# Patient Record
Sex: Male | Born: 1935
Health system: Southern US, Community
[De-identification: ages and names within clinical notes are randomized; demographics above are authoritative.]

## PROBLEM LIST (undated history)

## (undated) DIAGNOSIS — R972 Elevated prostate specific antigen [PSA]: Secondary | ICD-10-CM

## (undated) DIAGNOSIS — D638 Anemia in other chronic diseases classified elsewhere: Secondary | ICD-10-CM

## (undated) DIAGNOSIS — I160 Hypertensive urgency: Secondary | ICD-10-CM

## (undated) DIAGNOSIS — N400 Enlarged prostate without lower urinary tract symptoms: Secondary | ICD-10-CM

## (undated) DIAGNOSIS — J690 Pneumonitis due to inhalation of food and vomit: Secondary | ICD-10-CM

## (undated) DIAGNOSIS — I639 Cerebral infarction, unspecified: Secondary | ICD-10-CM

## (undated) DIAGNOSIS — G309 Alzheimer's disease, unspecified: Secondary | ICD-10-CM

## (undated) DIAGNOSIS — J101 Influenza due to other identified influenza virus with other respiratory manifestations: Secondary | ICD-10-CM

## (undated) DIAGNOSIS — S42302A Unspecified fracture of shaft of humerus, left arm, initial encounter for closed fracture: Secondary | ICD-10-CM

## (undated) DIAGNOSIS — G459 Transient cerebral ischemic attack, unspecified: Secondary | ICD-10-CM

## (undated) DIAGNOSIS — D509 Iron deficiency anemia, unspecified: Secondary | ICD-10-CM

## (undated) DIAGNOSIS — F0280 Dementia in other diseases classified elsewhere without behavioral disturbance: Secondary | ICD-10-CM

## (undated) DIAGNOSIS — N390 Urinary tract infection, site not specified: Secondary | ICD-10-CM

## (undated) DIAGNOSIS — D473 Essential (hemorrhagic) thrombocythemia: Secondary | ICD-10-CM

## (undated) DIAGNOSIS — M109 Gout, unspecified: Secondary | ICD-10-CM

## (undated) DIAGNOSIS — R5381 Other malaise: Secondary | ICD-10-CM

## (undated) DIAGNOSIS — J189 Pneumonia, unspecified organism: Secondary | ICD-10-CM

## (undated) DIAGNOSIS — A419 Sepsis, unspecified organism: Secondary | ICD-10-CM

## (undated) DIAGNOSIS — R1310 Dysphagia, unspecified: Secondary | ICD-10-CM

## (undated) DIAGNOSIS — D75839 Thrombocytosis, unspecified: Secondary | ICD-10-CM

## (undated) DIAGNOSIS — D72829 Elevated white blood cell count, unspecified: Secondary | ICD-10-CM

## (undated) DIAGNOSIS — N159 Renal tubulo-interstitial disease, unspecified: Secondary | ICD-10-CM

## (undated) DIAGNOSIS — N179 Acute kidney failure, unspecified: Secondary | ICD-10-CM

## (undated) DIAGNOSIS — G9341 Metabolic encephalopathy: Secondary | ICD-10-CM

## (undated) HISTORY — DX: Dysphagia, unspecified: R13.10

## (undated) HISTORY — DX: Pneumonitis due to inhalation of food and vomit: J69.0

## (undated) HISTORY — DX: Hypertensive urgency: I16.0

## (undated) HISTORY — DX: Thrombocytosis, unspecified: D75.839

## (undated) HISTORY — DX: Urinary tract infection, site not specified: N39.0

## (undated) HISTORY — DX: Unspecified fracture of shaft of humerus, left arm, initial encounter for closed fracture: S42.302A

## (undated) HISTORY — DX: Iron deficiency anemia, unspecified: D50.9

## (undated) HISTORY — DX: Metabolic encephalopathy: G93.41

## (undated) HISTORY — DX: Anemia in other chronic diseases classified elsewhere: D63.8

## (undated) HISTORY — DX: Sepsis, unspecified organism: A41.9

## (undated) HISTORY — DX: Elevated prostate specific antigen (PSA): R97.20

## (undated) HISTORY — DX: Influenza due to other identified influenza virus with other respiratory manifestations: J10.1

## (undated) HISTORY — DX: Transient cerebral ischemic attack, unspecified: G45.9

## (undated) HISTORY — DX: Acute kidney failure, unspecified: N17.9

## (undated) HISTORY — DX: Benign prostatic hyperplasia without lower urinary tract symptoms: N40.0

## (undated) HISTORY — DX: Other malaise: R53.81

## (undated) HISTORY — DX: Pneumonia, unspecified organism: J18.9

## (undated) HISTORY — DX: Essential (hemorrhagic) thrombocythemia: D47.3

## (undated) HISTORY — DX: Elevated white blood cell count, unspecified: D72.829

---

## 2009-03-25 ENCOUNTER — Emergency Department (HOSPITAL_COMMUNITY): Admission: EM | Admit: 2009-03-25 | Discharge: 2009-03-25 | Payer: Self-pay | Admitting: Emergency Medicine

## 2010-05-29 HISTORY — PX: CATARACT EXTRACTION: SUR2

## 2010-09-01 LAB — URINE CULTURE: Colony Count: NO GROWTH

## 2010-09-01 LAB — POCT URINALYSIS DIP (DEVICE)
Bilirubin Urine: NEGATIVE
Glucose, UA: NEGATIVE mg/dL
Nitrite: NEGATIVE
Urobilinogen, UA: 1 mg/dL (ref 0.0–1.0)

## 2011-01-20 ENCOUNTER — Emergency Department (HOSPITAL_COMMUNITY)
Admission: EM | Admit: 2011-01-20 | Discharge: 2011-01-20 | Disposition: A | Discharge status: Home / Self Care | Payer: Medicare Other | Attending: Emergency Medicine | Admitting: Emergency Medicine

## 2011-01-20 DIAGNOSIS — I10 Essential (primary) hypertension: Secondary | ICD-10-CM | POA: Insufficient documentation

## 2011-01-20 DIAGNOSIS — N368 Other specified disorders of urethra: Secondary | ICD-10-CM | POA: Insufficient documentation

## 2011-01-20 DIAGNOSIS — N501 Vascular disorders of male genital organs: Secondary | ICD-10-CM | POA: Insufficient documentation

## 2011-01-20 LAB — DIFFERENTIAL
Basophils Relative: 1 % (ref 0–1)
Eosinophils Absolute: 0.1 10*3/uL (ref 0.0–0.7)
Lymphs Abs: 3 10*3/uL (ref 0.7–4.0)
Monocytes Absolute: 0.8 10*3/uL (ref 0.1–1.0)
Monocytes Relative: 8 % (ref 3–12)
Neutro Abs: 6.4 10*3/uL (ref 1.7–7.7)
Neutrophils Relative %: 62 % (ref 43–77)

## 2011-01-20 LAB — URINALYSIS, ROUTINE W REFLEX MICROSCOPIC
Glucose, UA: NEGATIVE mg/dL
Hgb urine dipstick: NEGATIVE
Ketones, ur: NEGATIVE mg/dL
Protein, ur: NEGATIVE mg/dL
pH: 5.5 (ref 5.0–8.0)

## 2011-01-20 LAB — POCT I-STAT, CHEM 8
BUN: 20 mg/dL (ref 6–23)
Calcium, Ion: 1.21 mmol/L (ref 1.12–1.32)
Chloride: 104 mEq/L (ref 96–112)
Creatinine, Ser: 1.2 mg/dL (ref 0.50–1.35)
TCO2: 31 mmol/L (ref 0–100)

## 2011-01-20 LAB — CBC
Hemoglobin: 12.7 g/dL — ABNORMAL LOW (ref 13.0–17.0)
MCH: 23 pg — ABNORMAL LOW (ref 26.0–34.0)
MCHC: 32.2 g/dL (ref 30.0–36.0)
MCV: 71.4 fL — ABNORMAL LOW (ref 78.0–100.0)
RBC: 5.52 MIL/uL (ref 4.22–5.81)

## 2011-07-24 DIAGNOSIS — H25019 Cortical age-related cataract, unspecified eye: Secondary | ICD-10-CM | POA: Diagnosis not present

## 2011-07-24 DIAGNOSIS — H2510 Age-related nuclear cataract, unspecified eye: Secondary | ICD-10-CM | POA: Diagnosis not present

## 2012-01-28 HISTORY — PX: MOUTH SURGERY: SHX715

## 2012-02-06 DIAGNOSIS — M278 Other specified diseases of jaws: Secondary | ICD-10-CM | POA: Diagnosis not present

## 2012-02-06 DIAGNOSIS — F0390 Unspecified dementia without behavioral disturbance: Secondary | ICD-10-CM | POA: Diagnosis not present

## 2012-02-06 DIAGNOSIS — K069 Disorder of gingiva and edentulous alveolar ridge, unspecified: Secondary | ICD-10-CM | POA: Diagnosis not present

## 2012-02-06 DIAGNOSIS — K056 Periodontal disease, unspecified: Secondary | ICD-10-CM | POA: Diagnosis not present

## 2012-02-06 DIAGNOSIS — K029 Dental caries, unspecified: Secondary | ICD-10-CM | POA: Diagnosis not present

## 2012-02-06 DIAGNOSIS — K0520 Aggressive periodontitis, unspecified: Secondary | ICD-10-CM | POA: Diagnosis not present

## 2012-08-24 ENCOUNTER — Emergency Department (INDEPENDENT_AMBULATORY_CARE_PROVIDER_SITE_OTHER): Payer: Medicare Other

## 2012-08-24 ENCOUNTER — Emergency Department (INDEPENDENT_AMBULATORY_CARE_PROVIDER_SITE_OTHER)
Admission: EM | Admit: 2012-08-24 | Discharge: 2012-08-24 | Disposition: A | Discharge status: Home / Self Care | Payer: Medicare Other | Source: Home / Self Care | Attending: Family Medicine | Admitting: Family Medicine

## 2012-08-24 ENCOUNTER — Encounter (HOSPITAL_COMMUNITY): Payer: Self-pay | Admitting: Emergency Medicine

## 2012-08-24 DIAGNOSIS — J069 Acute upper respiratory infection, unspecified: Secondary | ICD-10-CM

## 2012-08-24 DIAGNOSIS — R5383 Other fatigue: Secondary | ICD-10-CM | POA: Diagnosis not present

## 2012-08-24 DIAGNOSIS — R509 Fever, unspecified: Secondary | ICD-10-CM | POA: Diagnosis not present

## 2012-08-24 DIAGNOSIS — R05 Cough: Secondary | ICD-10-CM | POA: Diagnosis not present

## 2012-08-24 MED ORDER — HYDROCOD POLST-CHLORPHEN POLST 10-8 MG/5ML PO LQCR: 5.0000 mL | Freq: Two times a day (BID) | ORAL | Status: DC | PRN

## 2012-08-24 NOTE — ED Notes (Signed)
Waiting discharge papers 

## 2012-08-24 NOTE — ED Notes (Signed)
Reports: dry nonproductive cough for 10 days. Pt states started feeling really bad/ fatigue on Sunday. Low grade temp yesterday only. Pt denies sob and chest pain, n/v/d  Pt has been using vicks rub and onion lemon tea with mild relief.

## 2012-08-24 NOTE — ED Provider Notes (Signed)
History     CSN: [id number]  Arrival date & time 08/24/12  1117   First MD Initiated Contact with Patient 08/24/12 1121      Chief Complaint  Patient presents with  . URI    chest congestion. dry nonproductive cough. low grad temp yesterday. fatigue    (Consider location/radiation/quality/duration/timing/severity/associated sxs/prior treatment) Patient is a 79 y.o. male presenting with URI. The history is provided by a relative.  URI Presenting symptoms: congestion, cough, fatigue, fever and rhinorrhea   Presenting symptoms: no ear pain, no facial pain and no sore throat   Congestion:    Location:  Chest Cough:    Cough characteristics:  Productive   Sputum characteristics:  Yellow   Severity:  Moderate   Onset quality:  Gradual   Duration:  2 weeks   Timing:  Intermittent   Progression:  Unchanged   Chronicity:  New Associated symptoms: sneezing   Associated symptoms: no neck pain, no sinus pain, no swollen glands and no wheezing   Risk factors: being elderly   Risk factors: no chronic cardiac disease, no chronic kidney disease, no chronic respiratory disease and no diabetes mellitus   Daughter reports pt had onset of cough approx 2 weeks aog. Monday pt began to "feel bad" and was sleeping more. She has noticed occasional rhinorrhea and sneezing. Yesterday pt had a  (subjective) low grade fever.  Family has been encouraging fluids and hot tea for sx's which has seemed to help but when pt seemed to have fever yesterday with persistent cough she was concerned he may have pneumonia. Otherwise pt has not c/o sore throat, ear pain, N/V/D, SOB, CP or abd pain.    History reviewed. No pertinent past medical history.  Past Surgical History  Procedure Laterality Date  . Eye surgery    . Mouth surgery      History reviewed. No pertinent family history.  History  Substance Use Topics  . Smoking status: Never Smoker   . Smokeless tobacco: Not on file  . Alcohol Use: No       Review of Systems  Constitutional: Positive for fever and fatigue.  HENT: Positive for congestion, rhinorrhea and sneezing. Negative for ear pain, sore throat, neck pain, neck stiffness and ear discharge.   Eyes: Negative.   Respiratory: Positive for cough. Negative for wheezing.   Gastrointestinal: Negative.   Endocrine: Negative.   Genitourinary: Negative.   Allergic/Immunologic: Negative.   Neurological: Negative.   Hematological: Negative.   Psychiatric/Behavioral: Negative.     Allergies  Review of patient's allergies indicates no known allergies.  Home Medications  No current outpatient prescriptions on file.  BP 134/66  Pulse 74  Temp(Src) 98 F (36.7 C) (Oral)  Resp 12  Physical Exam  Constitutional: He is oriented to person, place, and time. He appears well-developed and well-nourished.  HENT:  Head: Normocephalic and atraumatic.  Right Ear: Tympanic membrane, external ear and ear canal normal.  Left Ear: Tympanic membrane, external ear and ear canal normal.  Nose: Nose normal.  Mouth/Throat: Uvula is midline, oropharynx is clear and moist and mucous membranes are normal.  Eyes: Conjunctivae are normal.  Neck: Neck supple.  Cardiovascular: Normal rate and regular rhythm.   Pulmonary/Chest: Effort normal and breath sounds normal.  Abdominal: Soft. Bowel sounds are normal.  Musculoskeletal: Normal range of motion.  Neurological: He is alert and oriented to person, place, and time.  Skin: Skin is warm and dry.  Psychiatric: He has a normal mood and  affect.    ED Course  Procedures (including critical care time)  Labs Reviewed - No data to display Dg Chest 2 View  08/24/2012  *RADIOLOGY REPORT*  Clinical Data: Cough.  Fever.  Fatigue.  CHEST - 2 VIEW  Comparison: None.  Findings: Normal sized heart.  Small amount of linear density at both lung bases.  Otherwise, clear lungs.  Mild thoracic spine degenerative changes.  IMPRESSION: Mild by basilar  atelectasis or scarring.   Original Report Authenticated By: Beckie Salts, M.D.      No diagnosis found.    MDM  2 wk h/o persistent cough. Productive of yellow sputum. Subjective fever (low grade) yesterday. Mild associated URI's. PE unremarkable. Pt is non-toxic in appearance. CXR negative for any acute process. No SOB. Daughter encouraged to continue supportive (symptomatic) care, encourage rest, PO fluids and short course of medication for cough to use at night.         Leanne Chang, NP 08/24/12 1241

## 2012-08-25 NOTE — ED Provider Notes (Signed)
Medical screening examination/treatment/procedure(s) were performed by resident physician or non-physician practitioner and as supervising physician I was immediately available for consultation/collaboration.   Barkley Bruns MD.   Linna Hoff, MD 08/25/12 [id number]

## 2012-09-30 DIAGNOSIS — H2510 Age-related nuclear cataract, unspecified eye: Secondary | ICD-10-CM | POA: Diagnosis not present

## 2012-09-30 DIAGNOSIS — H25019 Cortical age-related cataract, unspecified eye: Secondary | ICD-10-CM | POA: Diagnosis not present

## 2012-10-18 ENCOUNTER — Inpatient Hospital Stay (HOSPITAL_COMMUNITY): Payer: Medicare Other

## 2012-10-18 ENCOUNTER — Emergency Department (HOSPITAL_COMMUNITY): Payer: Medicare Other

## 2012-10-18 ENCOUNTER — Encounter (HOSPITAL_COMMUNITY): Payer: Self-pay | Admitting: Emergency Medicine

## 2012-10-18 ENCOUNTER — Inpatient Hospital Stay (HOSPITAL_COMMUNITY)
Admission: EM | Admit: 2012-10-18 | Discharge: 2012-10-20 | DRG: 689 | Disposition: A | Discharge status: Home / Self Care | Payer: Medicare Other | Attending: Internal Medicine | Admitting: Internal Medicine

## 2012-10-18 DIAGNOSIS — K6289 Other specified diseases of anus and rectum: Secondary | ICD-10-CM | POA: Diagnosis not present

## 2012-10-18 DIAGNOSIS — K4090 Unilateral inguinal hernia, without obstruction or gangrene, not specified as recurrent: Secondary | ICD-10-CM | POA: Diagnosis not present

## 2012-10-18 DIAGNOSIS — B9689 Other specified bacterial agents as the cause of diseases classified elsewhere: Secondary | ICD-10-CM | POA: Diagnosis present

## 2012-10-18 DIAGNOSIS — J690 Pneumonitis due to inhalation of food and vomit: Secondary | ICD-10-CM | POA: Diagnosis not present

## 2012-10-18 DIAGNOSIS — N400 Enlarged prostate without lower urinary tract symptoms: Secondary | ICD-10-CM | POA: Diagnosis not present

## 2012-10-18 DIAGNOSIS — N390 Urinary tract infection, site not specified: Secondary | ICD-10-CM | POA: Diagnosis not present

## 2012-10-18 DIAGNOSIS — A499 Bacterial infection, unspecified: Secondary | ICD-10-CM | POA: Diagnosis present

## 2012-10-18 DIAGNOSIS — R29810 Facial weakness: Secondary | ICD-10-CM | POA: Diagnosis present

## 2012-10-18 DIAGNOSIS — F29 Unspecified psychosis not due to a substance or known physiological condition: Secondary | ICD-10-CM | POA: Diagnosis not present

## 2012-10-18 DIAGNOSIS — M109 Gout, unspecified: Secondary | ICD-10-CM | POA: Diagnosis present

## 2012-10-18 DIAGNOSIS — R4182 Altered mental status, unspecified: Secondary | ICD-10-CM | POA: Diagnosis not present

## 2012-10-18 DIAGNOSIS — G459 Transient cerebral ischemic attack, unspecified: Secondary | ICD-10-CM | POA: Diagnosis present

## 2012-10-18 DIAGNOSIS — N401 Enlarged prostate with lower urinary tract symptoms: Secondary | ICD-10-CM | POA: Diagnosis present

## 2012-10-18 DIAGNOSIS — F0280 Dementia in other diseases classified elsewhere without behavioral disturbance: Secondary | ICD-10-CM | POA: Diagnosis present

## 2012-10-18 DIAGNOSIS — K629 Disease of anus and rectum, unspecified: Secondary | ICD-10-CM | POA: Diagnosis not present

## 2012-10-18 DIAGNOSIS — J9819 Other pulmonary collapse: Secondary | ICD-10-CM | POA: Diagnosis present

## 2012-10-18 DIAGNOSIS — G9341 Metabolic encephalopathy: Secondary | ICD-10-CM

## 2012-10-18 DIAGNOSIS — R32 Unspecified urinary incontinence: Secondary | ICD-10-CM | POA: Diagnosis present

## 2012-10-18 DIAGNOSIS — J189 Pneumonia, unspecified organism: Secondary | ICD-10-CM | POA: Diagnosis not present

## 2012-10-18 DIAGNOSIS — I69919 Unspecified symptoms and signs involving cognitive functions following unspecified cerebrovascular disease: Secondary | ICD-10-CM | POA: Diagnosis not present

## 2012-10-18 DIAGNOSIS — D509 Iron deficiency anemia, unspecified: Secondary | ICD-10-CM | POA: Diagnosis present

## 2012-10-18 DIAGNOSIS — I6509 Occlusion and stenosis of unspecified vertebral artery: Secondary | ICD-10-CM | POA: Diagnosis not present

## 2012-10-18 DIAGNOSIS — N320 Bladder-neck obstruction: Secondary | ICD-10-CM | POA: Diagnosis present

## 2012-10-18 DIAGNOSIS — I69319 Unspecified symptoms and signs involving cognitive functions following cerebral infarction: Secondary | ICD-10-CM

## 2012-10-18 DIAGNOSIS — N138 Other obstructive and reflux uropathy: Secondary | ICD-10-CM | POA: Diagnosis present

## 2012-10-18 DIAGNOSIS — I160 Hypertensive urgency: Secondary | ICD-10-CM

## 2012-10-18 DIAGNOSIS — R918 Other nonspecific abnormal finding of lung field: Secondary | ICD-10-CM | POA: Diagnosis not present

## 2012-10-18 DIAGNOSIS — G309 Alzheimer's disease, unspecified: Secondary | ICD-10-CM | POA: Diagnosis not present

## 2012-10-18 DIAGNOSIS — I10 Essential (primary) hypertension: Secondary | ICD-10-CM | POA: Diagnosis not present

## 2012-10-18 DIAGNOSIS — R972 Elevated prostate specific antigen [PSA]: Secondary | ICD-10-CM

## 2012-10-18 DIAGNOSIS — K5730 Diverticulosis of large intestine without perforation or abscess without bleeding: Secondary | ICD-10-CM | POA: Diagnosis not present

## 2012-10-18 HISTORY — DX: Gout, unspecified: M10.9

## 2012-10-18 HISTORY — DX: Alzheimer's disease, unspecified: G30.9

## 2012-10-18 HISTORY — DX: Cerebral infarction, unspecified: I63.9

## 2012-10-18 HISTORY — DX: Dementia in other diseases classified elsewhere, unspecified severity, without behavioral disturbance, psychotic disturbance, mood disturbance, and anxiety: F02.80

## 2012-10-18 LAB — CBC WITH DIFFERENTIAL/PLATELET
Basophils Relative: 0 % (ref 0–1)
Eosinophils Absolute: 0.1 10*3/uL (ref 0.0–0.7)
Lymphs Abs: 2.4 10*3/uL (ref 0.7–4.0)
MCH: 23.1 pg — ABNORMAL LOW (ref 26.0–34.0)
MCHC: 33 g/dL (ref 30.0–36.0)
Monocytes Absolute: 1 10*3/uL (ref 0.1–1.0)
Monocytes Relative: 10 % (ref 3–12)
Neutro Abs: 6.9 10*3/uL (ref 1.7–7.7)
RBC: 5.33 MIL/uL (ref 4.22–5.81)
WBC: 10.5 10*3/uL (ref 4.0–10.5)

## 2012-10-18 LAB — CBC
MCH: 23 pg — ABNORMAL LOW (ref 26.0–34.0)
MCHC: 32.5 g/dL (ref 30.0–36.0)
MCV: 70.6 fL — ABNORMAL LOW (ref 78.0–100.0)
Platelets: 202 10*3/uL (ref 150–400)
RBC: 5.14 MIL/uL (ref 4.22–5.81)

## 2012-10-18 LAB — COMPREHENSIVE METABOLIC PANEL
BUN: 15 mg/dL (ref 6–23)
CO2: 26 mEq/L (ref 19–32)
Calcium: 9.3 mg/dL (ref 8.4–10.5)
Creatinine, Ser: 1.16 mg/dL (ref 0.50–1.35)
GFR calc Af Amer: 63 mL/min — ABNORMAL LOW (ref >90)
GFR calc non Af Amer: 55 mL/min — ABNORMAL LOW (ref >90)
Glucose, Bld: 93 mg/dL (ref 70–99)
Sodium: 140 mEq/L (ref 135–145)
Total Protein: 7.1 g/dL (ref 6.0–8.3)

## 2012-10-18 LAB — LIPID PANEL
Cholesterol: 163 mg/dL (ref 0–200)
Triglycerides: 31 mg/dL (ref <150)

## 2012-10-18 LAB — URINALYSIS, ROUTINE W REFLEX MICROSCOPIC
Nitrite: POSITIVE — AB
Protein, ur: NEGATIVE mg/dL
Specific Gravity, Urine: 1.01 (ref 1.005–1.030)
Urobilinogen, UA: 0.2 mg/dL (ref 0.0–1.0)

## 2012-10-18 LAB — AMMONIA: Ammonia: 15 umol/L (ref 11–60)

## 2012-10-18 LAB — LACTIC ACID, PLASMA: Lactic Acid, Venous: 1.1 mmol/L (ref 0.5–2.2)

## 2012-10-18 LAB — GLUCOSE, RANDOM: Glucose, Bld: 129 mg/dL — ABNORMAL HIGH (ref 70–99)

## 2012-10-18 LAB — URINE MICROSCOPIC-ADD ON

## 2012-10-18 MED ORDER — DEXTROSE 5 % IV SOLN
1.0000 g | INTRAVENOUS | Status: DC
  Administered 2012-10-18: 1 g via INTRAVENOUS
  Filled 2012-10-18: qty 10

## 2012-10-18 MED ORDER — SODIUM CHLORIDE 0.9 % IV SOLN
INTRAVENOUS | Status: AC
  Administered 2012-10-18: 50 mL/h via INTRAVENOUS

## 2012-10-18 MED ORDER — ASPIRIN EC 325 MG PO TBEC
325.0000 mg | DELAYED_RELEASE_TABLET | Freq: Every day | ORAL | Status: DC
  Administered 2012-10-19 (×2): 325 mg via ORAL
  Filled 2012-10-18 (×3): qty 1

## 2012-10-18 MED ORDER — ACETAMINOPHEN 325 MG PO TABS: 650.0000 mg | ORAL_TABLET | Freq: Four times a day (QID) | ORAL | Status: DC | PRN

## 2012-10-18 MED ORDER — METOPROLOL TARTRATE 1 MG/ML IV SOLN
5.0000 mg | Freq: Once | INTRAVENOUS | Status: AC
  Administered 2012-10-18: 5 mg via INTRAVENOUS
  Filled 2012-10-18: qty 5

## 2012-10-18 MED ORDER — SODIUM CHLORIDE 0.9 % IV SOLN
1000.0000 mL | Freq: Once | INTRAVENOUS | Status: AC
  Administered 2012-10-18: 1000 mL via INTRAVENOUS

## 2012-10-18 MED ORDER — ESMOLOL HCL-SODIUM CHLORIDE 2000 MG/100ML IV SOLN
25.0000 ug/kg/min | Freq: Once | INTRAVENOUS | Status: AC
  Administered 2012-10-18: 25 ug/kg/min via INTRAVENOUS
  Filled 2012-10-18: qty 100

## 2012-10-18 MED ORDER — ACETAMINOPHEN 650 MG RE SUPP: 650.0000 mg | Freq: Four times a day (QID) | RECTAL | Status: DC | PRN

## 2012-10-18 MED ORDER — LABETALOL HCL 100 MG PO TABS: 100.0000 mg | ORAL_TABLET | Freq: Two times a day (BID) | ORAL | Status: DC

## 2012-10-18 MED ORDER — SODIUM CHLORIDE 0.9 % IJ SOLN
3.0000 mL | Freq: Two times a day (BID) | INTRAMUSCULAR | Status: DC
  Administered 2012-10-18: 3 mL via INTRAVENOUS

## 2012-10-18 MED ORDER — AMLODIPINE BESYLATE 5 MG PO TABS
5.0000 mg | ORAL_TABLET | Freq: Every day | ORAL | Status: DC
  Administered 2012-10-19 (×2): 5 mg via ORAL
  Filled 2012-10-18 (×4): qty 1

## 2012-10-18 MED ORDER — LABETALOL HCL 100 MG PO TABS
100.0000 mg | ORAL_TABLET | Freq: Two times a day (BID) | ORAL | Status: DC
  Administered 2012-10-18 – 2012-10-19 (×3): 100 mg via ORAL
  Filled 2012-10-18 (×5): qty 1

## 2012-10-18 MED ORDER — ENOXAPARIN SODIUM 40 MG/0.4ML ~~LOC~~ SOLN
40.0000 mg | SUBCUTANEOUS | Status: DC
  Filled 2012-10-18 (×3): qty 0.4

## 2012-10-18 MED ORDER — ONDANSETRON HCL 4 MG/2ML IJ SOLN: 4.0000 mg | Freq: Three times a day (TID) | INTRAMUSCULAR | Status: AC | PRN

## 2012-10-18 MED ORDER — ADULT MULTIVITAMIN W/MINERALS CH
1.0000 | ORAL_TABLET | Freq: Every day | ORAL | Status: DC
Start: 2012-10-18 — End: 2012-10-20
  Filled 2012-10-18 (×2): qty 1

## 2012-10-18 MED ORDER — CLONIDINE HCL 0.1 MG PO TABS
0.1000 mg | ORAL_TABLET | Freq: Two times a day (BID) | ORAL | Status: DC
  Administered 2012-10-18 – 2012-10-19 (×2): 0.1 mg via ORAL
  Filled 2012-10-18 (×3): qty 1

## 2012-10-18 MED ORDER — HALOPERIDOL LACTATE 5 MG/ML IJ SOLN
1.0000 mg | Freq: Four times a day (QID) | INTRAMUSCULAR | Status: DC | PRN
  Filled 2012-10-18: qty 0.2

## 2012-10-18 MED ORDER — IOHEXOL 300 MG/ML  SOLN
100.0000 mL | Freq: Once | INTRAMUSCULAR | Status: AC | PRN
  Administered 2012-10-18: 100 mL via INTRAVENOUS

## 2012-10-18 MED ORDER — SODIUM CHLORIDE 0.9 % IV SOLN
1000.0000 mL | INTRAVENOUS | Status: DC
  Administered 2012-10-18: 1000 mL via INTRAVENOUS

## 2012-10-18 MED ORDER — ONDANSETRON HCL 4 MG/2ML IJ SOLN: 4.0000 mg | Freq: Four times a day (QID) | INTRAMUSCULAR | Status: DC | PRN

## 2012-10-18 MED ORDER — ONDANSETRON HCL 4 MG PO TABS: 4.0000 mg | ORAL_TABLET | Freq: Four times a day (QID) | ORAL | Status: DC | PRN

## 2012-10-18 MED ORDER — CLONIDINE HCL 0.1 MG PO TABS: 0.1000 mg | ORAL_TABLET | Freq: Two times a day (BID) | ORAL | Status: DC

## 2012-10-18 NOTE — ED Notes (Signed)
Spoke with Admit Doctor will change order from step down to ICU.

## 2012-10-18 NOTE — ED Provider Notes (Signed)
History     CSN: [id number]  Arrival date & time 10/18/12  1015   First MD Initiated Contact with Patient 10/18/12 1015      Chief Complaint  Patient presents with  . Altered Mental Status    (Consider location/radiation/quality/duration/timing/severity/associated sxs/prior treatment) HPI Patient presents from home with altered mental status. Patient has history of Alzheimer's disease, but no other noted significant medical problems. Per report the patient is ambulatory, interactive, in generally good health beyond his dementia. Per report the patient woke up 5 hours prior to my evaluation, was ambulatory, 8 breakfast, took a shower.  Soon thereafter the patient's family found him unresponsive. Family reports that this has happened on multiple prior occasions, but without the situation.  Per report EMS found the patient intermittently following commands and responding to painful stimuli, with no evidence of distress with appropriate breath sounds, particularly.  The patient does not committed on my initial exam, but he is moving all extremity spontaneously, minimally.  He is intermittently following commands, and response to both ammonia and painful sternal rub.   Past Medical History  Diagnosis Date  . Alzheimers disease     Past Surgical History  Procedure Laterality Date  . Eye surgery    . Mouth surgery      No family history on file.  History  Substance Use Topics  . Smoking status: Never Smoker   . Smokeless tobacco: Not on file  . Alcohol Use: No      Review of Systems  Unable to perform ROS: Dementia    Allergies  Review of patient's allergies indicates no known allergies.  Home Medications   Current Outpatient Rx  Name  Route  Sig  Dispense  Refill  . chlorpheniramine-HYDROcodone (TUSSIONEX PENNKINETIC ER) 10-8 MG/5ML LQCR   Oral   Take 5 mLs by mouth every 12 (twelve) hours as needed.   50 mL   0     BP 161/77  Pulse 78  Temp(Src) 98 F  (36.7 C) (Oral)  Resp 18  SpO2 98%  Physical Exam  Nursing note and vitals reviewed. Constitutional: He is oriented to person, place, and time. He appears well-developed. No distress.  HENT:  Head: Normocephalic and atraumatic.  Eyes: Conjunctivae and EOM are normal.  Cardiovascular: Normal rate and regular rhythm.   Pulmonary/Chest: Effort normal. No stridor. No respiratory distress.  Airway protected  Abdominal: He exhibits no distension.  Musculoskeletal: He exhibits no edema.  Neurological: He is alert and oriented to person, place, and time.  Patient will not allow his hand to his face with neurologic testing, suggesting no flaccidity, nor seizure. Patient has minimal spontaneous movement of all extremities, opens and closes his mouth, opens and closes his eyes, tracks intermittently.  He is aphasic.  Skin: Skin is warm and dry.  Psychiatric:  Noncooperative with exam.  Unable to assess.    ED Course  Procedures (including critical care time)  Labs Reviewed  COMPREHENSIVE METABOLIC PANEL  LACTIC ACID, PLASMA  URINALYSIS, ROUTINE W REFLEX MICROSCOPIC  CBC WITH DIFFERENTIAL   No results found.   No diagnosis found.  Pulse ox 99% room air normal  Cardiac: 80sr, normal   Date: 10/18/2012  Rate: 79  Rhythm: normal sinus rhythm  QRS Axis: left  Intervals: normal  ST/T Wave abnormalities: nonspecific T wave changes  Conduction Disutrbances:none  Narrative Interpretation:   Old EKG Reviewed: none available Abnormal   11:48 AM Patient is awake, alert, smiling at his granddaughter  Update:  After the initial significant improvement, the patient remained in a similar mental state for several hours.  However, his blood pressure went from normal to significant hypertension, then improved spontaneously come only to return to elevated levels.  This proceeded in spite of no change in condition, and the addition of new beta blocker.  Update: With the persistency increase  in blood pressure, to a maximum of greater than 220 systolic, I initiated an esmolol drip.   Update: Patient now complains of abdominal pain.  On exam there is a firmness about the suprapubic region, and suspicion of urinary retention.  The patient had bladder scan which demonstrated greater than 700 mL of urine present.  Since completion of a Foley catheter resulted in both clinical improvement and production of significant amounts of urine. CT scan was ordered given his description of significant pain in his prolific defecation since initial evaluation.  Update: Blood pressure improving, with systolic less than 200 > 165/65   MDM  This elderly male presents with altered mental status.  Given his history of dementia, obtaining much information is difficult initially, but multiple family members are present throughout the rest of the patient's evaluation.  Concerning we, the patient's blood pressure increased substantially on his numbers are, though he appeared calm, significant improved from his initial episode of altered mental status. His evaluation was notable for multiple cranial abnormalities, though no evidence of acute processes.  There suspicion for occult hypertension, which has not been previously treated. However, given his hypertensive urgency/emergency, and lack of response to initial medication, labetalol, I initiated esmolol drip.  Patient reports that down unit disposition do to this medication.  CRITICAL CARE Performed by: Gerhard Munch Total critical care time: 50 Critical care time was exclusive of separately billable procedures and treating other patients. Critical care was necessary to treat or prevent imminent or life-threatening deterioration. Critical care was time spent personally by me on the following activities: development of treatment plan with patient and/or surrogate as well as nursing, discussions with consultants, evaluation of patient's response to treatment,  examination of patient, obtaining history from patient or surrogate, ordering and performing treatments and interventions, ordering and review of laboratory studies, ordering and review of radiographic studies, pulse oximetry and re-evaluation of patient's condition.         Gerhard Munch, MD 10/18/12 1623

## 2012-10-18 NOTE — ED Notes (Signed)
Pt complaining of lower abdominal pain, MD informed. Bladder scan performed: 707 mL. MD ordered foley catheter to be inserted.

## 2012-10-18 NOTE — ED Notes (Signed)
Called report to floor and admit Doctor placing additional orders. Paged admitting Doctor for clarification.

## 2012-10-18 NOTE — ED Notes (Signed)
Pt is less restless at this time. Pt is no longer complaining of lower abdominal pain.

## 2012-10-18 NOTE — ED Notes (Signed)
Gaspar Skeeters, NP about change in pt. Status. Pt. Stable to got to Telemetry. Esmolol gtt. Off for 45 minutes.

## 2012-10-18 NOTE — ED Notes (Signed)
Family states one day ago patient in car with air condition on and became cold shivering.  Shut off vent on dashboard and changed to floor warmed patient and patient stopped shivering.

## 2012-10-18 NOTE — H&P (Signed)
[facility] History and Physical  Kierre Hintz ZOX:[id number] DOB: [birth year] DOA: 10/18/2012  Referring physician:  Gerhard Munch PCP:  No primary provider on file.   Chief Complaint:  Altered mental status  HPI:  The patient is a 79 y.o. year-old male with history of Alzheimer's dementia, gout, urinary incontinence who is followed by the [facility] who presents with confusion.  The patient was last at their baseline health a few weeks ago.  He has had increasing urinary frequency, foul odor to his urine, some penile discharge, and dark colored urine which has been worsening.  He has been slightly more fatigue for the last two days.  Normally, he awakes around 4 or 5AM, but this morning, he was cleaned up around 4AM by his daughter who is his primary caretaker.  He went back to sleep and then around 8:30 or 9AM, his daughter tried to arouse him, however, he was essentially unresponsive.  She called his name, shook him, but he did not open his eyes.  His mouth looked somewhat twisted.  The daughter called 911 and EMS took him to the hospital where he was still somewhat unresponsive.    ER:  Initially his vital signs were stable and about 20 minutes after he arrived, he started opening his eyes and communicating.  His face continued to not look entirely symmetric.  When examined by the ER physician, he responded to painful stimuli and seemed to have some abdominal pain.  Bladder scan revealed urinary retention and of urine was removed via foley catheter.  CT of the abd/pelvis demonstrated an enlarged prostate, bladder wall thickening suggesting chronic bladder outlet obstruction, and "abnormal soft tissues" in the mid and lower rectum which may be related to inflammation or neoplasm.  CT head demonstrated no acute abnormality, however, he had some suggestion of subacute stroke of the left thalamus and evidence of old bilateral basal ganglia lacunar infarcts.  There was also severe diffuse  atrophy suggestive of longstanding microvascular ischemia.  Labs were notable for microcytic anemia with hgb 12.3, MCV 70, and UA with positive nitrites, moderate LE, 3-6 WBC, many bacteria from catheterized specimen.  CXR with bilateral basilar opacities suggestive of aspiration PNA.  His blood pressure rose quickly to 239/103 and he did not respond to IV labetalol so he was started on an esmolol gtt.  He will be admitted to stepdown for ongoing management of hypertensive urgency.    Review of Systems:  Patient with severe dementia and altered mental status.  Unable to give ROS.  Caregivers state that he has had some chills the last couple of days, but no fevers, sinus congestion, sore throat, cough, nausea, vomiting, diarrhea, blood in stools.  No skin rashes or infections.    Past Medical History  Diagnosis Date  . Alzheimers disease   . Gout   . CVA (cerebral infarction)    Past Surgical History  Procedure Laterality Date  . Cataract extraction Left 2012  . Mouth surgery  01/2012    tooth extractions and bone shaved    Social History:  reports that he has never smoked. He does not have any smokeless tobacco history on file. He reports that he does not drink alcohol or use illicit drugs.  Lives with his daughter and his wife.  Another daughter is involved in his care.  Walks without assist device.  No home services.  Former Optician, dispensing.     No Known Allergies  Family History  Problem Relation Age of Onset  .  High blood pressure Sister   . Diabetes Neg Hx      Prior to Admission medications   Medication Sig Start Date End Date Taking? Authorizing Provider  fish oil-omega-3 fatty acids 1000 MG capsule Take 1 g by mouth daily.   Yes Historical Provider, MD  Multiple Vitamin (MULTIVITAMIN WITH MINERALS) TABS Take 1 tablet by mouth daily.   Yes Historical Provider, MD  OVER THE COUNTER MEDICATION Take by mouth daily with breakfast. Cherry juice   Yes Historical Provider, MD   Physical  Exam: Filed Vitals:   10/18/12 1630 10/18/12 1700 10/18/12 1730 10/18/12 1745  BP: 185/90 186/109 193/88 190/83  Pulse: 107     Temp:      TempSrc:      Resp: 19 20 18 17   Weight:      SpO2: 97% 97% 97% 96%     General:  AAM, no acute distress, awake and alert.  Eyes:  PERRL, anicteric, non-injected.  ENT:  Nares clear.  OP clear, non-erythematous without plaques or exudates.  MMM.  Neck:  Supple without TM or JVD.    Lymph:  No cervical, supraclavicular, or submandibular LAD.  Cardiovascular:  RRR, normal S1, S2, without m/r/g.  2+ pulses, warm extremities  Respiratory:  CTA bilaterally without increased WOB.  Abdomen:  NABS.  Soft, ND/NT.    Skin:  No rashes or focal lesions.  Musculoskeletal:  Normal bulk and tone.  No LE edema.  Psychiatric:  A & O x 4.  Appropriate affect.  Neurologic:  CN 3-12 intact.  5/5 strength.  Sensation intact.  Labs on Admission:  Basic Metabolic Panel:  Recent Labs Lab 10/18/12 1033  NA 140  K 4.8  CL 104  CO2 26  GLUCOSE 93  BUN 15  CREATININE 1.16  CALCIUM 9.3   Liver Function Tests:  Recent Labs Lab 10/18/12 1033  AST 42*  ALT 16  ALKPHOS 66  BILITOT 0.8  PROT 7.1  ALBUMIN 3.3*   No results found for this basename: LIPASE, AMYLASE,  in the last 168 hours No results found for this basename: AMMONIA,  in the last 168 hours CBC:  Recent Labs Lab 10/18/12 1033  WBC 10.5  NEUTROABS 6.9  HGB 12.3*  HCT 37.3*  MCV 70.0*  PLT 203   Cardiac Enzymes: No results found for this basename: CKTOTAL, CKMB, CKMBINDEX, TROPONINI,  in the last 168 hours  BNP (last 3 results) No results found for this basename: PROBNP,  in the last 8760 hours CBG: No results found for this basename: GLUCAP,  in the last 168 hours  Radiological Exams on Admission: Ct Abdomen Pelvis Wo Contrast  10/18/2012   *RADIOLOGY REPORT*  Clinical Data: Abdominal pain  CT ABDOMEN AND PELVIS WITHOUT CONTRAST  Technique:  Multidetector CT imaging  of the abdomen and pelvis was performed following the standard protocol without intravenous contrast.  Comparison: None.  Findings: High density material is present in the dependent portion of the gallbladder on image 29.  This may represent gallstones.  Liver, spleen, pancreas, adrenal glands are grossly within normal limits.  Motion artifact degrades the study.  Mild aortic valve and left anterior descending coronary artery calcifications.  Kidneys are atrophic.  No obvious mass or hydronephrosis.  The bladder is decompressed with a Foley catheter.  The prostate is very large measuring 7.1 x 7.7 cm. There is diffuse bladder wall thickening likely related to longstanding bladder outlet obstruction.  Diverticulosis of the sigmoid colon.  Sigmoid colon  is decompressed.  At the region of the lower rectum, there is markedly prominent soft tissues of the rectal wall.  An inflammatory process or mass cannot be excluded.  See images 68-75.  Atherosclerotic faster calcifications noted in the aorta and iliac vessels.  Advanced degenerative changes in the lumbar spine.  Degenerative changes of the hip joints.  No destructive bone lesion.  No obvious acute bony deformity.  Right inguinal hernia contains only adipose tissue.  IMPRESSION: Possible gallstones.  Ultrasound may be helpful.  Marked enlargement of the prostate.  Correlate with physical exam and PSA.  Foley catheter is in place decompressing the bladder.  There is signs of wall thickening in the bladder suggesting sequela bladder outlet obstruction.  Abnormal soft tissues involving the mid and lower rectum.  An inflammatory process or neoplasm cannot be excluded.  Contrast- enhanced study of the pelvis may be helpful.   Original Report Authenticated By: Jolaine Click, M.D.   Ct Head Wo Contrast  10/18/2012   *RADIOLOGY REPORT*  Clinical Data: Altered mental status, unresponsive  CT HEAD WITHOUT CONTRAST  Technique:  Contiguous axial images were obtained from the  base of the skull through the vertex without contrast.  Comparison: None.  Findings: No acute intracranial hemorrhage, acute infarction, mass lesion, mass effect, midline shift or hydrocephalus.  Gray-white differentiation is preserved throughout.  Global cerebral and cerebellar atrophy.  Extensive periventricular, subcortical and deep white matter hypoattenuation which is nonspecific but most consistent with the sequela of longstanding microvascular ischemia. Focal sig of well defined hypoattenuation in the bilateral anterior basal ganglia consistent with lacunar infarcts.  There is a less well defined region of hypoattenuation of the left thalamus which could represent a more subacute ischemic insult.  Dense atherosclerotic calcification of the bilateral cavernous carotid arteries.  Normal aeration of the mastoid air cells and paranasal sinuses.  No acute soft tissue or calvarial abnormality.  IMPRESSION:  1.  No acute intracranial abnormality. Mildly limited study secondary to motion related artifact. 2.  Marginally well defined hypoattenuation in the left thalamus may represent an area of subacute ischemic insult 3.  Bilateral remote basal ganglia lacunar infarcts 4.  Cerebral and cerebellar atrophy with extensive white matter disease which is nonspecific but most consistent with the sequela of longstanding microvascular ischemia.   Original Report Authenticated By: Malachy Moan, M.D.   Dg Chest Port 1 View  10/18/2012   *RADIOLOGY REPORT*  Clinical Data: Altered mental status.  PORTABLE CHEST - 1 VIEW  Comparison: 08/24/2012.  Findings: 1044 hours.  There are lower lung volumes with new patchy left greater than right basilar opacities.  No pneumothorax or significant pleural effusion is identified.  Heart size and mediastinal contours are stable.  Multiple telemetry leads overlie the chest.  IMPRESSION: Low lung volumes with new bibasilar opacities.  Although these may reflect atelectasis, aspiration  cannot be excluded.   Original Report Authenticated By: Carey Bullocks, M.D.    EKG: pending  Assessment/Plan Principal Problem:   Hypertensive urgency, malignant Active Problems:   UTI (lower urinary tract infection)   Metabolic encephalopathy   TIA (transient ischemic attack)   Microcytic anemia   BPH (benign prostatic hyperplasia)   Abnormality of rectum   CVA, old, cognitive deficits   Alzheimer's dementia   Patient's unresponsive episode may have been due to metabolic encephalopathy due to UTI, progressive dementia, or possibly a TIA given his history of strokes and the facial droop.  Will investigate and treat for all three.  TIA:  Symptoms resolving -  Telemetry -  MRI/MRA brain (may need premedication) -  Carotid duplex -  ECHO -  PT/OT/Speech therapy -  Full dose aspirin daily -  Lipid panel -  A1c  UTI:  -  F/u urine culture -  Zosyn (see below)  Dementia:  Oriented only to self.  Difficulty following commands at baseline.   -  TSH, B12, vit D, RPR.  Consider HIV test -  At risk for delirium and may need sitter -  Haldol prn  -  Lights on during the day and off at night -  Frequent reorientation  Bilateral basilar opacities may reflect aspiration PNA or atelectasis -  Zosyn -  OOB as tolerated -  Patient likely would not be able to comply with incentive spirometry -  Consider repeat CXR  -  Speech therapy assessment  Hypertensive urgency -  No evidence of PRES on CT -  MRI brain -  Cycle troponins -  Continue telemetry -  Esmolol gtt to goal BP of 160/90 -  Start norvasc -  Start clonidine BID -  Start labetalol 100mg  BID -  Avoid diuretics b/c making copious urine (autodiuresing) -  Avoid ACEI/ARB due to anticipating contrasted study.  -  Hold hydralazine due to tachycardia (even on esmolol gtt) -  Elevation of blood pressure may be to due to acute infection/possible acute stroke and may trend down some with time  Abnormal rectal tissue on CT.   Concern for malignancy given microcytic anemia -  CT with contrast of abdomen and pelvis   BPH with evidence of chronic urinary retention.  -  Offered flomax, but family would like to not start this medication currently -  Continue foley for now and try to D/C tomorrow.   -  Will need PVRs and if retaining > of urine, would readdress flomax  Microcytic anemia -  Occult stool -  TSH, B12, folate, iron studies  Diet:  Healthy heart Access:  PIV IVF:  NS at 72ml/h Proph:  lovenox  Code Status: full code Family Communication: spoke with patient, two daughters, grandson, granddaughter, and great granddaughter. Disposition Plan: admit to stepdown  Time spent: 60 min  Bilaal Leib [facility] Pager [phone]  If 7PM-7AM, please contact night-coverage [email or web address] Password [facility] 10/18/2012, 6:26 PM

## 2012-10-18 NOTE — ED Notes (Signed)
Patient had two large brown formed bowel movements 30 minutes apart.

## 2012-10-18 NOTE — Progress Notes (Signed)
ANTIBIOTIC CONSULT NOTE - INITIAL  Pharmacy Consult for Zosyn Indication: possible aspiration PNA  No Known Allergies  Patient Measurements: Weight: 180 lb (81.647 kg) (estimated by ED)  Vital Signs: Temp: 98 F (36.7 C) (05/23 1614) Temp src: Axillary (05/23 1614) BP: 190/83 mmHg (05/23 1745) Pulse Rate: 107 (05/23 1630) Intake/Output from previous day:   Intake/Output from this shift: Total I/O In: -  Out: 2300 [Urine:2300]  Labs:  Recent Labs  10/18/12 1033  WBC 10.5  HGB 12.3*  PLT 203  CREATININE 1.16   CrCl is unknown because there is no height on file for the current visit. No results found for this basename: VANCOTROUGH, VANCOPEAK, VANCORANDOM, GENTTROUGH, GENTPEAK, GENTRANDOM, TOBRATROUGH, TOBRAPEAK, TOBRARND, AMIKACINPEAK, AMIKACINTROU, AMIKACIN,  in the last 72 hours   Microbiology: No results found for this or any previous visit (from the past 720 hour(s)).  Medical History: Past Medical History  Diagnosis Date  . Alzheimers disease   . Gout   . CVA (cerebral infarction)     Medications:  Home: Fish oil, MVI  Assessment: 79 y.o. male presents from home with AMS. Note that family found pt unresponsive but was responding to intemittent commands by the time EMS arrived. To begin Zosyn for possible aspiration PNA. Estimated CrCl 45 ml/min. Pt received Rocephin 1gm in ED ~1715.  Goal of Therapy:  Eradication of infection  Plan:  1. Zosyn 3.375gm now over 30 minutes then Zosyn 3.375gm IV q8h - subsequent doses over 4 hrs 2. Will f/u microbiological data, renal function, clinical condition  Christoper Fabian, PharmD, BCPS Clinical pharmacist, pager [phone] 10/18/2012,6:39 PM

## 2012-10-18 NOTE — ED Notes (Signed)
IV team at bedside 

## 2012-10-18 NOTE — ED Notes (Signed)
Family called EMS for patient altered mental status. Patient had history of Alzheimer and woke up at 0400 ambulatory ate breakfast and took a shower.  Went back to his room and family checked on patient unresponsive.  Family told EMS happened before however not for this long.  Upon arrival with EDP patient intermittent following commands with sternal rub and ammonia inhalant. Breathing even unlabored equal chest rise and fall.

## 2012-10-19 ENCOUNTER — Inpatient Hospital Stay (HOSPITAL_COMMUNITY): Payer: Medicare Other

## 2012-10-19 DIAGNOSIS — G9341 Metabolic encephalopathy: Secondary | ICD-10-CM

## 2012-10-19 DIAGNOSIS — N390 Urinary tract infection, site not specified: Principal | ICD-10-CM

## 2012-10-19 LAB — BASIC METABOLIC PANEL
BUN: 15 mg/dL (ref 6–23)
Creatinine, Ser: 1.17 mg/dL (ref 0.50–1.35)
GFR calc Af Amer: 63 mL/min — ABNORMAL LOW (ref >90)
GFR calc non Af Amer: 54 mL/min — ABNORMAL LOW (ref >90)
Glucose, Bld: 114 mg/dL — ABNORMAL HIGH (ref 70–99)

## 2012-10-19 LAB — CBC
Hemoglobin: 12 g/dL — ABNORMAL LOW (ref 13.0–17.0)
MCH: 22.6 pg — ABNORMAL LOW (ref 26.0–34.0)
MCHC: 32 g/dL (ref 30.0–36.0)
RDW: 17.4 % — ABNORMAL HIGH (ref 11.5–15.5)

## 2012-10-19 LAB — VITAMIN D 25 HYDROXY (VIT D DEFICIENCY, FRACTURES): Vit D, 25-Hydroxy: 25 ng/mL — ABNORMAL LOW (ref 30–89)

## 2012-10-19 LAB — FERRITIN: Ferritin: 122 ng/mL (ref 22–322)

## 2012-10-19 LAB — TROPONIN I
Troponin I: <0.3 ng/mL (ref <0.30)
Troponin I: <0.3 ng/mL (ref <0.30)

## 2012-10-19 LAB — TSH: TSH: 0.458 u[IU]/mL (ref 0.350–4.500)

## 2012-10-19 LAB — IRON AND TIBC: UIBC: 218 ug/dL (ref 125–400)

## 2012-10-19 LAB — HEMOGLOBIN A1C: Mean Plasma Glucose: 117 mg/dL — ABNORMAL HIGH (ref <117)

## 2012-10-19 LAB — VITAMIN B12: Vitamin B-12: 800 pg/mL (ref 211–911)

## 2012-10-19 MED ORDER — PIPERACILLIN-TAZOBACTAM 3.375 G IVPB
3.3750 g | Freq: Three times a day (TID) | INTRAVENOUS | Status: DC
  Administered 2012-10-19 – 2012-10-20 (×3): 3.375 g via INTRAVENOUS
  Filled 2012-10-19 (×5): qty 50

## 2012-10-19 MED ORDER — TAMSULOSIN HCL 0.4 MG PO CAPS
0.4000 mg | ORAL_CAPSULE | Freq: Every day | ORAL | Status: DC
  Administered 2012-10-19: 0.4 mg via ORAL
  Filled 2012-10-19 (×3): qty 1

## 2012-10-19 MED ORDER — SODIUM CHLORIDE 0.9 % IV SOLN
1000.0000 mL | INTRAVENOUS | Status: DC
  Administered 2012-10-19: 1000 mL via INTRAVENOUS

## 2012-10-19 NOTE — Progress Notes (Signed)
Nutrition Brief Note  Patient identified on the Malnutrition Screening Tool (MST) Report  Body mass index is 24.58 kg/(m^2). Patient meets criteria for normal weight based on current BMI. Pt is unable to provide history but, family in room does not know pt's usually body weight but, has not noticed any weight loss and states pt eats very well. Pt appears very well nourished.  Current diet order is heart healthy, patient is consuming approximately 25% of meals at this time. Per pt's family, pt did not like his breakfast but, PTA pt was eating 100% of 3 meals daily. Family also states that pt does not have dentures currently and needs soft foods only. Will change diet to dysphagia 3, Heart Healthy. Labs and medications reviewed.  Recommend liberalizing diet to regular.   No further nutrition interventions warranted at this time. If nutrition issues arise, please consult RD.   Ian Malkin RD, LDN Inpatient Clinical Dietitian Pager: [phone] After Hours Pager: [phone]

## 2012-10-19 NOTE — Progress Notes (Addendum)
Pt. Was sweating when I went to assist lab tech as she had diff sticking pt earlier due to his dementia.  Pt. Disoriented.  Had to hold pt's arm for stick and he still jerked back but blood was obtained.  Gave bath and changed sheets;foley cath leaked.  IV right hand would not flush/infiltrated.  L hand IV pulled out as pt stiffens up when trying to turn him and he fights although we told him what we were doing, he still does not understand in order to cooperate.  No IV site at this point.  Did not feel agitating him further at this time would be beneficial.   He ate a healthy choice meal earlier upon arrival to unit.  Taking po fluids as well.    He is resisting everything thus far.  VSS.

## 2012-10-19 NOTE — Progress Notes (Signed)
[facility] PROGRESS NOTE  Douglas Mckee ZOX:[id number] DOB: [birth year] DOA: 10/18/2012 PCP: No primary provider on file.  Assessment/Plan: 1. Metabolic encephalopathy most likely, doubt TIA/CVA but agree with MRI -currently pending -mentation improved and pretty much at baseline per family -DC echo and carotid, unless MRI shows otherwise -resume diet  2. UTI/Bladder outlet obstruction -continue Zosyn, FU cultures -from BPH vs prostate CA -check PSA -voiding trial, flomax, DC foley and monitor PVR/Bladder scan as needed -may need foley replaced   3. Dementia: - continue Haldol prn  - Frequent reorientation - FU TSH, B12, RPR  4. HTN: uncontrolled -improved, DC clonidine -continue amlodipine, labetalol  5. Bilateral basilar opacities may reflect aspiration PNA or atelectasis  - continue Zosyn  - OOB as tolerated  - Repeat CXR today - Speech therapy assessment  DVt proph: lovenox  Code Status: FULL Family Communication: d/w pt's grandson at bedside Disposition Plan:to be determined   Antibiotics:  ZOsyn  HPI/Subjective: Was agitated last night, about to leave for MRI  Objective: Filed Vitals:   10/18/12 2227 10/18/12 2230 10/19/12 0347 10/19/12 1353  BP: 145/62  145/63 113/45  Pulse: 88  82 72  Temp: 98.3 F (36.8 C)  99.3 F (37.4 C) 97.7 F (36.5 C)  TempSrc: Oral  Oral Oral  Resp: 19  19 18   Height:  5\' 8"  (1.727 m)    Weight:  82 kg (180 lb 12.4 oz) 73.3 kg (161 lb 9.6 oz)   SpO2: 100%  98% 94%    Intake/Output Summary (Last 24 hours) at 10/19/12 1443 Last data filed at 10/19/12 0300  Gross per 24 hour  Intake      0 ml  Output   2950 ml  Net  -2950 ml   Filed Weights   10/18/12 1500 10/18/12 2230 10/19/12 0347  Weight: 81.647 kg (180 lb) 82 kg (180 lb 12.4 oz) 73.3 kg (161 lb 9.6 oz)    Exam:   General: alert, awake, oriented to self only  Cardiovascular: S1S2/RRR  Respiratory: CTAB  Abdomen: soft, NT, BS present  Ext: no  edema c/c  Neuro: moves all extremities, no localising signs  Data Reviewed: Basic Metabolic Panel:  Recent Labs Lab 10/18/12 1033 10/18/12 2247 10/19/12 0731  NA 140  --  141  K 4.8  --  4.0  CL 104  --  105  CO2 26  --  25  GLUCOSE 93 129* 114*  BUN 15  --  15  CREATININE 1.16 1.15 1.17  CALCIUM 9.3  --  9.2   Liver Function Tests:  Recent Labs Lab 10/18/12 1033  AST 42*  ALT 16  ALKPHOS 66  BILITOT 0.8  PROT 7.1  ALBUMIN 3.3*   No results found for this basename: LIPASE, AMYLASE,  in the last 168 hours  Recent Labs Lab 10/18/12 2247  AMMONIA 15   CBC:  Recent Labs Lab 10/18/12 1033 10/18/12 2247 10/19/12 0731  WBC 10.5 12.3* 11.6*  NEUTROABS 6.9  --   --   HGB 12.3* 11.8* 12.0*  HCT 37.3* 36.3* 37.5*  MCV 70.0* 70.6* 70.5*  PLT 203 202 200   Cardiac Enzymes:  Recent Labs Lab 10/18/12 2247 10/19/12 0300 10/19/12 0731  TROPONINI <0.30 <0.30 <0.30   BNP (last 3 results) No results found for this basename: PROBNP,  in the last 8760 hours CBG: No results found for this basename: GLUCAP,  in the last 168 hours  No results found for this or any previous visit (  from the past 240 hour(s)).   Studies: Ct Abdomen Pelvis Wo Contrast  10/18/2012   *RADIOLOGY REPORT*  Clinical Data: Abdominal pain  CT ABDOMEN AND PELVIS WITHOUT CONTRAST  Technique:  Multidetector CT imaging of the abdomen and pelvis was performed following the standard protocol without intravenous contrast.  Comparison: None.  Findings: High density material is present in the dependent portion of the gallbladder on image 29.  This may represent gallstones.  Liver, spleen, pancreas, adrenal glands are grossly within normal limits.  Motion artifact degrades the study.  Mild aortic valve and left anterior descending coronary artery calcifications.  Kidneys are atrophic.  No obvious mass or hydronephrosis.  The bladder is decompressed with a Foley catheter.  The prostate is very large  measuring 7.1 x 7.7 cm. There is diffuse bladder wall thickening likely related to longstanding bladder outlet obstruction.  Diverticulosis of the sigmoid colon.  Sigmoid colon is decompressed.  At the region of the lower rectum, there is markedly prominent soft tissues of the rectal wall.  An inflammatory process or mass cannot be excluded.  See images 68-75.  Atherosclerotic faster calcifications noted in the aorta and iliac vessels.  Advanced degenerative changes in the lumbar spine.  Degenerative changes of the hip joints.  No destructive bone lesion.  No obvious acute bony deformity.  Right inguinal hernia contains only adipose tissue.  IMPRESSION: Possible gallstones.  Ultrasound may be helpful.  Marked enlargement of the prostate.  Correlate with physical exam and PSA.  Foley catheter is in place decompressing the bladder.  There is signs of wall thickening in the bladder suggesting sequela bladder outlet obstruction.  Abnormal soft tissues involving the mid and lower rectum.  An inflammatory process or neoplasm cannot be excluded.  Contrast- enhanced study of the pelvis may be helpful.   Original Report Authenticated By: Jolaine Click, M.D.   Dg Chest 2 View  10/19/2012   *RADIOLOGY REPORT*  Clinical Data: Follow-up for possible pneumonia.  Pneumonia versus atelectasis.  Clinically doubt pneumonia.  CHEST - 2 VIEW  Comparison: 10/18/2012 CT and plain film  Findings: Film is made with shallow lung inflation.  Heart is mildly enlarged.  There is minimal bibasilar atelectasis or infiltrate.  However no focal consolidations or pleural effusions are identified.  Degenerative changes are seen in the spine.  IMPRESSION:  1.  Cardiomegaly without edema. 2.  Bibasilar atelectasis or infiltrate without focal consolidation.   Original Report Authenticated By: Norva Pavlov, M.D.   Ct Head Wo Contrast  10/18/2012   *RADIOLOGY REPORT*  Clinical Data: Altered mental status, unresponsive  CT HEAD WITHOUT CONTRAST   Technique:  Contiguous axial images were obtained from the base of the skull through the vertex without contrast.  Comparison: None.  Findings: No acute intracranial hemorrhage, acute infarction, mass lesion, mass effect, midline shift or hydrocephalus.  Gray-white differentiation is preserved throughout.  Global cerebral and cerebellar atrophy.  Extensive periventricular, subcortical and deep white matter hypoattenuation which is nonspecific but most consistent with the sequela of longstanding microvascular ischemia. Focal sig of well defined hypoattenuation in the bilateral anterior basal ganglia consistent with lacunar infarcts.  There is a less well defined region of hypoattenuation of the left thalamus which could represent a more subacute ischemic insult.  Dense atherosclerotic calcification of the bilateral cavernous carotid arteries.  Normal aeration of the mastoid air cells and paranasal sinuses.  No acute soft tissue or calvarial abnormality.  IMPRESSION:  1.  No acute intracranial abnormality. Mildly limited study  secondary to motion related artifact. 2.  Marginally well defined hypoattenuation in the left thalamus may represent an area of subacute ischemic insult 3.  Bilateral remote basal ganglia lacunar infarcts 4.  Cerebral and cerebellar atrophy with extensive white matter disease which is nonspecific but most consistent with the sequela of longstanding microvascular ischemia.   Original Report Authenticated By: Malachy Moan, M.D.   Ct Pelvis W Contrast  10/18/2012   *RADIOLOGY REPORT*  Clinical Data:  Abnormal rectal tissue noted on noncontrast CT.  CT PELVIS WITH CONTRAST  Technique:  Multidetector CT imaging of the pelvis was performed using the standard protocol following the bolus administration of intravenous contrast.  Contrast: OMNIPAQUE IOHEXOL 300 MG/ML  SOLN  Comparison:  CT of the abdomen and pelvis performed earlier today at 03:56 p.m.  Findings:  The previously noted  prominent soft tissues adjacent to the rectum are less prominent on the current study; previously noted apparent rectal wall thickening appears to have reflected intraluminal contents, and has resolved.  Note is again made of a markedly enlarged irregularly enhancing prostate, measuring approximately 8.5 x 8.1 x 6.9 cm, with a somewhat irregular contour and marked mass effect on the base of the bladder.  There is also irregular wall thickening involving much of the bladder; though this could reflect chronic inflammation, malignancy cannot be excluded.  No definite pelvic sidewall lymphadenopathy is seen.  The bladder is decompressed, with a Foley catheter in place.  Visualized small and large bowel loops are grossly unremarkable in appearance. Scattered vascular calcifications are seen.  No acute osseous abnormalities are identified.  Prominent osteophytes are seen along the lower lumbar spine.  IMPRESSION:  1.  Previously noted soft tissues about the rectum are less prominent; apparent rectal wall thickening has resolved, and likely reflected intraluminal contents. 2.  Markedly enlarged irregularly enhancing prostate noted, measuring 8.5 x 8.1 x 6.9 cm, with a somewhat irregular contour and marked mass effect on the base of the bladder.  Irregular wall thickening involving much of the bladder; though this could reflect chronic inflammation, malignancy cannot be excluded.  Would correlate with PSA and consider cystoscopy for further evaluation of the bladder.   Original Report Authenticated By: Tonia Ghent, M.D.   Mr [facility] Wo Contrast  10/19/2012   *RADIOLOGY REPORT*  Clinical Data:  Dementia.  Confusion.  Symptoms worsening.  MRI HEAD WITHOUT CONTRAST MRA HEAD WITHOUT CONTRAST  Technique:  Multiplanar, multiecho pulse sequences of the brain and surrounding structures were obtained without intravenous contrast. Angiographic images of the head were obtained using MRA technique without contrast.  Comparison:   Head CT 10/18/2012  MRI HEAD  Findings:  Diffusion imaging does not show any acute or subacute infarction.  There are chronic small vessel infarctions within the pons.  The cerebellum shows generalized atrophy with a few old small vessel infarctions.  The cerebral hemispheres show pronounced generalized atrophy with chronic small vessel changes throughout the deep and subcortical white matter.  There are old lacunar insults within the basal ganglia and thalami.  No large vessel territory infarction.  No evidence of mass lesion, acute hemorrhage, hydrocephalus or extra-axial collection.  There are scattered foci of hemosiderin deposition related to old white matter infarctions.  No pituitary mass.  No inflammatory sinus disease.  No skull or skull base lesion.  IMPRESSION: No acute finding.  Advanced generalized brain atrophy and extensive chronic small vessel disease throughout the brain.  MRA HEAD  Findings: Both internal carotid arteries are widely  patent into the brain.  No siphon stenosis.  The anterior and middle cerebral vessels are patent without proximal stenosis, aneurysm or vascular malformation.  More distal branch vessels show atherosclerotic narrowing and irregularity.  The right vertebral artery is the dominant vessel widely patent to the basilar.  The left vertebral artery is a small vessel patent to pica but occluded beyond that.  No basilar stenosis.  Posterior circulation branch vessels appear patent.  More distal PCA branches show atherosclerotic irregularity.  IMPRESSION: Distal vessel narrowing and irregularity consistent with diffuse intracranial atherosclerotic disease.  No major vessel occlusion or correctable proximal stenosis with exception that there is segmental occlusion of the left vertebral artery between pica and the basilar.  The left vertebral artery is nondominant.   Original Report Authenticated By: Paulina Fusi, M.D.   Mr Brain Wo Contrast  10/19/2012   *RADIOLOGY REPORT*   Clinical Data:  Dementia.  Confusion.  Symptoms worsening.  MRI HEAD WITHOUT CONTRAST MRA HEAD WITHOUT CONTRAST  Technique:  Multiplanar, multiecho pulse sequences of the brain and surrounding structures were obtained without intravenous contrast. Angiographic images of the head were obtained using MRA technique without contrast.  Comparison:  Head CT 10/18/2012  MRI HEAD  Findings:  Diffusion imaging does not show any acute or subacute infarction.  There are chronic small vessel infarctions within the pons.  The cerebellum shows generalized atrophy with a few old small vessel infarctions.  The cerebral hemispheres show pronounced generalized atrophy with chronic small vessel changes throughout the deep and subcortical white matter.  There are old lacunar insults within the basal ganglia and thalami.  No large vessel territory infarction.  No evidence of mass lesion, acute hemorrhage, hydrocephalus or extra-axial collection.  There are scattered foci of hemosiderin deposition related to old white matter infarctions.  No pituitary mass.  No inflammatory sinus disease.  No skull or skull base lesion.  IMPRESSION: No acute finding.  Advanced generalized brain atrophy and extensive chronic small vessel disease throughout the brain.  MRA HEAD  Findings: Both internal carotid arteries are widely patent into the brain.  No siphon stenosis.  The anterior and middle cerebral vessels are patent without proximal stenosis, aneurysm or vascular malformation.  More distal branch vessels show atherosclerotic narrowing and irregularity.  The right vertebral artery is the dominant vessel widely patent to the basilar.  The left vertebral artery is a small vessel patent to pica but occluded beyond that.  No basilar stenosis.  Posterior circulation branch vessels appear patent.  More distal PCA branches show atherosclerotic irregularity.  IMPRESSION: Distal vessel narrowing and irregularity consistent with diffuse intracranial  atherosclerotic disease.  No major vessel occlusion or correctable proximal stenosis with exception that there is segmental occlusion of the left vertebral artery between pica and the basilar.  The left vertebral artery is nondominant.   Original Report Authenticated By: Paulina Fusi, M.D.   Dg Chest Port 1 View  10/18/2012   *RADIOLOGY REPORT*  Clinical Data: Altered mental status.  PORTABLE CHEST - 1 VIEW  Comparison: 08/24/2012.  Findings: 1044 hours.  There are lower lung volumes with new patchy left greater than right basilar opacities.  No pneumothorax or significant pleural effusion is identified.  Heart size and mediastinal contours are stable.  Multiple telemetry leads overlie the chest.  IMPRESSION: Low lung volumes with new bibasilar opacities.  Although these may reflect atelectasis, aspiration cannot be excluded.   Original Report Authenticated By: Carey Bullocks, M.D.    Scheduled Meds: . amLODipine  5 mg Oral Daily  . aspirin EC  325 mg Oral Daily  . cloNIDine  0.1 mg Oral BID  . enoxaparin (LOVENOX) injection  40 mg Subcutaneous Q24H  . labetalol  100 mg Oral BID  . multivitamin with minerals  1 tablet Oral Daily  . piperacillin-tazobactam (ZOSYN)  IV  3.375 g Intravenous Q8H  . sodium chloride  3 mL Intravenous Q12H   Continuous Infusions: . sodium chloride 1,000 mL (10/19/12 1044)    Principal Problem:   Hypertensive urgency, malignant Active Problems:   UTI (lower urinary tract infection)   Metabolic encephalopathy   TIA (transient ischemic attack)   Microcytic anemia   BPH (benign prostatic hyperplasia)   Abnormality of rectum   CVA, old, cognitive deficits   Alzheimer's dementia   Aspiration pneumonia    Time spent:    [facility]  [facility] Pager [phone]. If 7PM-7AM, please contact night-coverage at [email or web address], password [facility] 10/19/2012, 2:43 PM  LOS: 1 day

## 2012-10-19 NOTE — Progress Notes (Signed)
VASCULAR LAB PRELIMINARY  PRELIMINARY  PRELIMINARY  PRELIMINARY  Carotid Dopplers completed.    Preliminary report:  There is no significant ICA stenosis.  Vertebral artery flow is antegrade.  Haruki Arnold, RVT 10/19/2012, 11:24 AM

## 2012-10-19 NOTE — Evaluation (Signed)
Physical Therapy Evaluation Patient Details Name: Douglas Mckee MRN: [id number] DOB: 03-30-1923 Today's Date: 10/19/2012 Time: 1191-4782 PT Time Calculation (min): 24 min  PT Assessment / Plan / Recommendation Clinical Impression  Patient is an 79 yo male admitted with hypertensive urgency, UTI, metabolic encephalopathy.  Patient with general weakness, decreased cognition, and slightly decreased balance - close to baseline.  Will benefit from acute PT to maximize independence prior to discharge.  Do not anticipate any f/u PT needs.  Would benefit from aide to assist with ADL's.    PT Assessment  Patient needs continued PT services    Follow Up Recommendations  No PT follow up;Supervision/Assistance - 24 hour (HH aide to assist with ADL's)    Does the patient have the potential to tolerate intense rehabilitation      Barriers to Discharge None      Equipment Recommendations  None recommended by PT    Recommendations for Other Services     Frequency Min 3X/week    Precautions / Restrictions Precautions Precautions: Fall Restrictions Weight Bearing Restrictions: No   Pertinent Vitals/Pain      Mobility  Bed Mobility Bed Mobility: Supine to Sit;Sitting - Scoot to Edge of Bed;Sit to Supine Supine to Sit: 4: Min guard;HOB flat Sitting - Scoot to Edge of Bed: 4: Min guard Sit to Supine: 4: Min assist;HOB flat Details for Bed Mobility Assistance: Visual and verbal cues to sit at edge of bed.  Assist for safety only.  Required min assist to raise LE's onto bed when returning to supine. Transfers Transfers: Sit to Stand;Stand to Sit Sit to Stand: 4: Min assist;With upper extremity assist;From bed;With armrests;From chair/3-in-1 Stand to Sit: 4: Min guard;With upper extremity assist;With armrests;To chair/3-in-1;To bed Details for Transfer Assistance: Verbal and tactile cues for hand placement.  Assist to rise to standing and for balance/safety. Ambulation/Gait Ambulation/Gait  Assistance: 4: Min assist Ambulation Distance (Feet): 132 Feet Assistive device: None Ambulation/Gait Assistance Details: Patient with slightly flexed posture.  Reaching for objects in hallway for support.  No loss of balance during gait. Gait Pattern: Step-through pattern;Decreased step length - right;Decreased step length - left;Shuffle;Trunk flexed Gait velocity: Slow    Exercises     PT Diagnosis: Abnormality of gait;Generalized weakness;Altered mental status  PT Problem List: Decreased strength;Decreased activity tolerance;Decreased balance;Decreased mobility;Decreased cognition PT Treatment Interventions: Gait training;Functional mobility training;Patient/family education   PT Goals Acute Rehab PT Goals PT Goal Formulation: With patient/family Time For Goal Achievement: 10/26/12 Potential to Achieve Goals: Good Pt will go Sit to Stand: with supervision;with upper extremity assist PT Goal: Sit to Stand - Progress: Goal set today Pt will Ambulate: >150 feet;with supervision PT Goal: Ambulate - Progress: Goal set today  Visit Information  Last PT Received On: 10/19/12 Assistance Needed: +1    Subjective Data  Subjective: Minimal verbalizations.  Patient Stated Goal: Unable to state.   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 difficulties    Copywriter, advertising Arousal/Alertness: Awake/alert Behavior During Therapy: Restless Overall Cognitive  Status: History of cognitive impairments - at baseline    Extremity/Trunk Assessment Right Upper Extremity Assessment RUE  ROM/Strength/Tone: [facility] for tasks assessed Left Upper Extremity Assessment LUE ROM/Strength/Tone: [facility] for tasks assessed Right Lower Extremity Assessment RLE ROM/Strength/Tone: [facility] for tasks assessed Left Lower Extremity Assessment LLE ROM/Strength/Tone: [facility] for tasks assessed   Balance Balance Balance Assessed: Yes Static Sitting Balance Static Sitting - Balance Support: No upper extremity supported;Feet supported Static Sitting - Level of Assistance: 5: Stand by assistance Static Sitting - Comment/# of Minutes: 3 Static Standing Balance Static Standing - Balance Support: No upper extremity supported Static Standing - Level of Assistance: 5: Stand by assistance Static Standing - Comment/# of Minutes: 2  End of Session PT - End of Session Equipment Utilized During Treatment: Gait belt Activity Tolerance: Patient tolerated treatment well Patient left: in bed;with call bell/phone within reach;with family/visitor present Nurse Communication: Mobility status  GP     Vena Austria 10/19/2012, 3:09 PM Durenda Hurt. Renaldo Fiddler, [facility] Acute Rehab Services Pager [phone]

## 2012-10-19 NOTE — Progress Notes (Signed)
Utilization review completed.  P.J. Malick Netz,RN,BSN Case Manager [phone]  

## 2012-10-20 DIAGNOSIS — R972 Elevated prostate specific antigen [PSA]: Secondary | ICD-10-CM

## 2012-10-20 LAB — PSA: PSA: 173.5 ng/mL — ABNORMAL HIGH (ref <=4.00)

## 2012-10-20 MED ORDER — AMLODIPINE BESYLATE 10 MG PO TABS: 10.0000 mg | ORAL_TABLET | Freq: Every day | ORAL | Status: DC

## 2012-10-20 MED ORDER — LEVOFLOXACIN 500 MG PO TABS: 500.0000 mg | ORAL_TABLET | Freq: Every day | ORAL | Status: DC

## 2012-10-20 MED ORDER — TAMSULOSIN HCL 0.4 MG PO CAPS: 0.4000 mg | ORAL_CAPSULE | Freq: Every day | ORAL | Status: DC

## 2012-10-21 LAB — URINE CULTURE: Colony Count: >=100000

## 2012-10-21 NOTE — Discharge Summary (Signed)
Physician Discharge Summary  Marion Rosenberry GNF:[id number] DOB: 1922/06/25 DOA: 10/18/2012  PCP: No primary provider on file.  Admit date: 10/18/2012 Discharge date: 10/21/2012  Time spent: 50 minutes  Recommendations for Outpatient Follow-up:  1. Urology at [facility] per family request in 2-3 weeks, needs biopsy 2. PCP in 1 week  Discharge Diagnoses:     Metabolic encephalopathy    UTI (lower urinary tract infection)   Microcytic anemia   Enlarged Prostate : BPH vs CA   CVA, old, cognitive deficits   Alzheimer's dementia   PSA elevation   Discharge Condition: stable  Diet recommendation: regular diet  Filed Weights   10/18/12 1500 10/18/12 2230 10/19/12 0347  Weight: 81.647 kg (180 lb) 82 kg (180 lb 12.4 oz) 73.3 kg (161 lb 9.6 oz)    History of present illness:  The patient is a 79 y.o. year-old male with history of Alzheimer's dementia, gout, urinary incontinence who is followed by the [facility] who presents with confusion. The patient was last at their baseline health a few weeks ago. He has had increasing urinary frequency, foul odor to his urine, some penile discharge, and dark colored urine which has been worsening. He has been slightly more fatigue for the last two days. Normally, he awakes around 4 or 5AM, but this morning, he was cleaned up around 4AM by his daughter who is his primary caretaker. He went back to sleep and then around 8:30 or 9AM, his daughter tried to arouse him, however, he was essentially unresponsive. She called his name, shook him, but he did not open his eyes. His mouth looked somewhat twisted. The daughter called 911 and EMS took him to the hospital where he was still somewhat unresponsive.  ER: Initially his vital signs were stable and about 20 minutes after he arrived, he started opening his eyes and communicating. His face continued to not look entirely symmetric. When examined by the ER physician, he responded to painful stimuli and seemed to have some  abdominal pain. Bladder scan revealed urinary retention and of urine was removed via foley catheter. CT of the abd/pelvis demonstrated an enlarged prostate, bladder wall thickening suggesting chronic bladder outlet obstruction, and "abnormal soft tissues" in the mid and lower rectum which may be related to inflammation or neoplasm. CT head demonstrated no acute abnormality, however, he had some suggestion of subacute stroke of the left thalamus and evidence of old bilateral basal ganglia lacunar infarcts. There was also severe diffuse atrophy suggestive of longstanding microvascular ischemia. Labs were notable for microcytic anemia with hgb 12.3, MCV 70, and UA with positive nitrites, moderate LE, 3-6 WBC, many bacteria from catheterized specimen. CXR with bilateral basilar opacities suggestive of aspiration PNA. His blood pressure rose quickly to 239/103 and he did not respond to IV labetalol so he was started on an esmolol gtt. He will be admitted to stepdown for ongoing management of hypertensive urgency.    Hospital Course:  1. Metabolic encephalopathy  -mentation improved and pretty much at baseline per family  -MRI negative for CVA  2. UTI/Bladder outlet obstruction  -initially treated with IV Zosyn,  -Urine cultures with Ecoli transitioned to PO Levaquin -PSA elevated at 173 -voiding trial, foley Dced, started on flomax, and was able to void after foley Dced, suspect Prostate CA. -This was explained to family his daughter and grandson and they wanted to take him  Home and follow up with Urology at the [place].   3. Dementia:  - stable, mentation improved  with treatment of UTI to baseline.  4. HTN: uncontrolled  -improved, -continue amlodipine, labetalol   5. Bilateral basilar opacities may reflect aspiration PNA or atelectasis  - Initially treated with IV Zosyn  - repeat CXR negative for infiltrate and suspect atelectasis only   Code Status: FULL  Family Communication: d/w pt's  grandson at bedside    Discharge Exam: Filed Vitals:   10/19/12 0347 10/19/12 1353 10/19/12 2036 10/20/12 0354  BP: 145/63 113/45 130/52 161/77  Pulse: 82 72 74 80  Temp: 99.3 F (37.4 C) 97.7 F (36.5 C) 98.3 F (36.8 C) 97.6 F (36.4 C)  TempSrc: Oral Oral Oral Axillary  Resp: 19 18 17 17   Height:      Weight: 73.3 kg (161 lb 9.6 oz)     SpO2: 98% 94% 93% 100%    General: Alert, awake, confused Cardiovascular: S1S2/RRR Respiratory: CTAB  Discharge Instructions  Discharge Orders   Future Orders Complete By Expires     Diet - low sodium heart healthy  As directed     Increase activity slowly  As directed         Medication List    TAKE these medications       amLODipine 10 MG tablet  Commonly known as:  NORVASC  Take 1 tablet (10 mg total) by mouth daily.     fish oil-omega-3 fatty acids 1000 MG capsule  Take 1 g by mouth daily.     levofloxacin 500 MG tablet  Commonly known as:  LEVAQUIN  Take 1 tablet (500 mg total) by mouth daily. For 5 days     multivitamin with minerals Tabs  Take 1 tablet by mouth daily.     OVER THE COUNTER MEDICATION  Take by mouth daily with breakfast. Cherry juice     tamsulosin 0.4 MG Caps  Commonly known as:  FLOMAX  Take 1 capsule (0.4 mg total) by mouth daily.       No Known Allergies     Follow-up Information   Follow up with Urology In 2 weeks. (Enlarged PRostate with elevated PSA, concerning for Prostate Cancer)        The results of significant diagnostics from this hospitalization (including imaging, microbiology, ancillary and laboratory) are listed below for reference.    Significant Diagnostic Studies: Ct Abdomen Pelvis Wo Contrast  10/18/2012   *RADIOLOGY REPORT*  Clinical Data: Abdominal pain  CT ABDOMEN AND PELVIS WITHOUT CONTRAST  Technique:  Multidetector CT imaging of the abdomen and pelvis was performed following the standard protocol without intravenous contrast.  Comparison: None.  Findings: High  density material is present in the dependent portion of the gallbladder on image 29.  This may represent gallstones.  Liver, spleen, pancreas, adrenal glands are grossly within normal limits.  Motion artifact degrades the study.  Mild aortic valve and left anterior descending coronary artery calcifications.  Kidneys are atrophic.  No obvious mass or hydronephrosis.  The bladder is decompressed with a Foley catheter.  The prostate is very large measuring 7.1 x 7.7 cm. There is diffuse bladder wall thickening likely related to longstanding bladder outlet obstruction.  Diverticulosis of the sigmoid colon.  Sigmoid colon is decompressed.  At the region of the lower rectum, there is markedly prominent soft tissues of the rectal wall.  An inflammatory process or mass cannot be excluded.  See images 68-75.  Atherosclerotic faster calcifications noted in the aorta and iliac vessels.  Advanced degenerative changes in the lumbar spine.  Degenerative changes  of the hip joints.  No destructive bone lesion.  No obvious acute bony deformity.  Right inguinal hernia contains only adipose tissue.  IMPRESSION: Possible gallstones.  Ultrasound may be helpful.  Marked enlargement of the prostate.  Correlate with physical exam and PSA.  Foley catheter is in place decompressing the bladder.  There is signs of wall thickening in the bladder suggesting sequela bladder outlet obstruction.  Abnormal soft tissues involving the mid and lower rectum.  An inflammatory process or neoplasm cannot be excluded.  Contrast- enhanced study of the pelvis may be helpful.   Original Report Authenticated By: Jolaine Click, M.D.   Dg Chest 2 View  10/19/2012   *RADIOLOGY REPORT*  Clinical Data: Follow-up for possible pneumonia.  Pneumonia versus atelectasis.  Clinically doubt pneumonia.  CHEST - 2 VIEW  Comparison: 10/18/2012 CT and plain film  Findings: Film is made with shallow lung inflation.  Heart is mildly enlarged.  There is minimal bibasilar  atelectasis or infiltrate.  However no focal consolidations or pleural effusions are identified.  Degenerative changes are seen in the spine.  IMPRESSION:  1.  Cardiomegaly without edema. 2.  Bibasilar atelectasis or infiltrate without focal consolidation.   Original Report Authenticated By: Norva Pavlov, M.D.   Ct Head Wo Contrast  10/18/2012   *RADIOLOGY REPORT*  Clinical Data: Altered mental status, unresponsive  CT HEAD WITHOUT CONTRAST  Technique:  Contiguous axial images were obtained from the base of the skull through the vertex without contrast.  Comparison: None.  Findings: No acute intracranial hemorrhage, acute infarction, mass lesion, mass effect, midline shift or hydrocephalus.  Gray-white differentiation is preserved throughout.  Global cerebral and cerebellar atrophy.  Extensive periventricular, subcortical and deep white matter hypoattenuation which is nonspecific but most consistent with the sequela of longstanding microvascular ischemia. Focal sig of well defined hypoattenuation in the bilateral anterior basal ganglia consistent with lacunar infarcts.  There is a less well defined region of hypoattenuation of the left thalamus which could represent a more subacute ischemic insult.  Dense atherosclerotic calcification of the bilateral cavernous carotid arteries.  Normal aeration of the mastoid air cells and paranasal sinuses.  No acute soft tissue or calvarial abnormality.  IMPRESSION:  1.  No acute intracranial abnormality. Mildly limited study secondary to motion related artifact. 2.  Marginally well defined hypoattenuation in the left thalamus may represent an area of subacute ischemic insult 3.  Bilateral remote basal ganglia lacunar infarcts 4.  Cerebral and cerebellar atrophy with extensive white matter disease which is nonspecific but most consistent with the sequela of longstanding microvascular ischemia.   Original Report Authenticated By: Malachy Moan, M.D.   Ct Pelvis W  Contrast  10/18/2012   *RADIOLOGY REPORT*  Clinical Data:  Abnormal rectal tissue noted on noncontrast CT.  CT PELVIS WITH CONTRAST  Technique:  Multidetector CT imaging of the pelvis was performed using the standard protocol following the bolus administration of intravenous contrast.  Contrast: OMNIPAQUE IOHEXOL 300 MG/ML  SOLN  Comparison:  CT of the abdomen and pelvis performed earlier today at 03:56 p.m.  Findings:  The previously noted prominent soft tissues adjacent to the rectum are less prominent on the current study; previously noted apparent rectal wall thickening appears to have reflected intraluminal contents, and has resolved.  Note is again made of a markedly enlarged irregularly enhancing prostate, measuring approximately 8.5 x 8.1 x 6.9 cm, with a somewhat irregular contour and marked mass effect on the base of the bladder.  There is  also irregular wall thickening involving much of the bladder; though this could reflect chronic inflammation, malignancy cannot be excluded.  No definite pelvic sidewall lymphadenopathy is seen.  The bladder is decompressed, with a Foley catheter in place.  Visualized small and large bowel loops are grossly unremarkable in appearance. Scattered vascular calcifications are seen.  No acute osseous abnormalities are identified.  Prominent osteophytes are seen along the lower lumbar spine.  IMPRESSION:  1.  Previously noted soft tissues about the rectum are less prominent; apparent rectal wall thickening has resolved, and likely reflected intraluminal contents. 2.  Markedly enlarged irregularly enhancing prostate noted, measuring 8.5 x 8.1 x 6.9 cm, with a somewhat irregular contour and marked mass effect on the base of the bladder.  Irregular wall thickening involving much of the bladder; though this could reflect chronic inflammation, malignancy cannot be excluded.  Would correlate with PSA and consider cystoscopy for further evaluation of the bladder.   Original  Report Authenticated By: Tonia Ghent, M.D.   Mr [facility] Wo Contrast  10/19/2012   *RADIOLOGY REPORT*  Clinical Data:  Dementia.  Confusion.  Symptoms worsening.  MRI HEAD WITHOUT CONTRAST MRA HEAD WITHOUT CONTRAST  Technique:  Multiplanar, multiecho pulse sequences of the brain and surrounding structures were obtained without intravenous contrast. Angiographic images of the head were obtained using MRA technique without contrast.  Comparison:  Head CT 10/18/2012  MRI HEAD  Findings:  Diffusion imaging does not show any acute or subacute infarction.  There are chronic small vessel infarctions within the pons.  The cerebellum shows generalized atrophy with a few old small vessel infarctions.  The cerebral hemispheres show pronounced generalized atrophy with chronic small vessel changes throughout the deep and subcortical white matter.  There are old lacunar insults within the basal ganglia and thalami.  No large vessel territory infarction.  No evidence of mass lesion, acute hemorrhage, hydrocephalus or extra-axial collection.  There are scattered foci of hemosiderin deposition related to old white matter infarctions.  No pituitary mass.  No inflammatory sinus disease.  No skull or skull base lesion.  IMPRESSION: No acute finding.  Advanced generalized brain atrophy and extensive chronic small vessel disease throughout the brain.  MRA HEAD  Findings: Both internal carotid arteries are widely patent into the brain.  No siphon stenosis.  The anterior and middle cerebral vessels are patent without proximal stenosis, aneurysm or vascular malformation.  More distal branch vessels show atherosclerotic narrowing and irregularity.  The right vertebral artery is the dominant vessel widely patent to the basilar.  The left vertebral artery is a small vessel patent to pica but occluded beyond that.  No basilar stenosis.  Posterior circulation branch vessels appear patent.  More distal PCA branches show atherosclerotic  irregularity.  IMPRESSION: Distal vessel narrowing and irregularity consistent with diffuse intracranial atherosclerotic disease.  No major vessel occlusion or correctable proximal stenosis with exception that there is segmental occlusion of the left vertebral artery between pica and the basilar.  The left vertebral artery is nondominant.   Original Report Authenticated By: Paulina Fusi, M.D.   Mr Brain Wo Contrast  10/19/2012   *RADIOLOGY REPORT*  Clinical Data:  Dementia.  Confusion.  Symptoms worsening.  MRI HEAD WITHOUT CONTRAST MRA HEAD WITHOUT CONTRAST  Technique:  Multiplanar, multiecho pulse sequences of the brain and surrounding structures were obtained without intravenous contrast. Angiographic images of the head were obtained using MRA technique without contrast.  Comparison:  Head CT 10/18/2012  MRI HEAD  Findings:  Diffusion imaging does not show any acute or subacute infarction.  There are chronic small vessel infarctions within the pons.  The cerebellum shows generalized atrophy with a few old small vessel infarctions.  The cerebral hemispheres show pronounced generalized atrophy with chronic small vessel changes throughout the deep and subcortical white matter.  There are old lacunar insults within the basal ganglia and thalami.  No large vessel territory infarction.  No evidence of mass lesion, acute hemorrhage, hydrocephalus or extra-axial collection.  There are scattered foci of hemosiderin deposition related to old white matter infarctions.  No pituitary mass.  No inflammatory sinus disease.  No skull or skull base lesion.  IMPRESSION: No acute finding.  Advanced generalized brain atrophy and extensive chronic small vessel disease throughout the brain.  MRA HEAD  Findings: Both internal carotid arteries are widely patent into the brain.  No siphon stenosis.  The anterior and middle cerebral vessels are patent without proximal stenosis, aneurysm or vascular malformation.  More distal branch  vessels show atherosclerotic narrowing and irregularity.  The right vertebral artery is the dominant vessel widely patent to the basilar.  The left vertebral artery is a small vessel patent to pica but occluded beyond that.  No basilar stenosis.  Posterior circulation branch vessels appear patent.  More distal PCA branches show atherosclerotic irregularity.  IMPRESSION: Distal vessel narrowing and irregularity consistent with diffuse intracranial atherosclerotic disease.  No major vessel occlusion or correctable proximal stenosis with exception that there is segmental occlusion of the left vertebral artery between pica and the basilar.  The left vertebral artery is nondominant.   Original Report Authenticated By: Paulina Fusi, M.D.   Dg Chest Port 1 View  10/18/2012   *RADIOLOGY REPORT*  Clinical Data: Altered mental status.  PORTABLE CHEST - 1 VIEW  Comparison: 08/24/2012.  Findings: 1044 hours.  There are lower lung volumes with new patchy left greater than right basilar opacities.  No pneumothorax or significant pleural effusion is identified.  Heart size and mediastinal contours are stable.  Multiple telemetry leads overlie the chest.  IMPRESSION: Low lung volumes with new bibasilar opacities.  Although these may reflect atelectasis, aspiration cannot be excluded.   Original Report Authenticated By: Carey Bullocks, M.D.    Microbiology: Recent Results (from the past 240 hour(s))  URINE CULTURE     Status: None   Collection Time    10/18/12  4:01 PM      Result Value Range Status   Specimen Description URINE, CATHETERIZED   Final   Special Requests NONE   Final   Culture  Setup Time 10/19/2012 04:24   Final   Colony Count >=100,000 COLONIES/ML   Final   Culture ESCHERICHIA COLI   Final   Report Status 10/21/2012 FINAL   Final   Organism ID, Bacteria ESCHERICHIA COLI   Final     Labs: Basic Metabolic Panel:  Recent Labs Lab 10/18/12 1033 10/18/12 2247 10/19/12 0731  NA 140  --  141  K  4.8  --  4.0  CL 104  --  105  CO2 26  --  25  GLUCOSE 93 129* 114*  BUN 15  --  15  CREATININE 1.16 1.15 1.17  CALCIUM 9.3  --  9.2   Liver Function Tests:  Recent Labs Lab 10/18/12 1033  AST 42*  ALT 16  ALKPHOS 66  BILITOT 0.8  PROT 7.1  ALBUMIN 3.3*   No results found for this basename: LIPASE, AMYLASE,  in the last 168  hours  Recent Labs Lab 10/18/12 2247  AMMONIA 15   CBC:  Recent Labs Lab 10/18/12 1033 10/18/12 2247 10/19/12 0731  WBC 10.5 12.3* 11.6*  NEUTROABS 6.9  --   --   HGB 12.3* 11.8* 12.0*  HCT 37.3* 36.3* 37.5*  MCV 70.0* 70.6* 70.5*  PLT 203 202 200   Cardiac Enzymes:  Recent Labs Lab 10/18/12 2247 10/19/12 0300 10/19/12 0731  TROPONINI <0.30 <0.30 <0.30   BNP: BNP (last 3 results) No results found for this basename: PROBNP,  in the last 8760 hours CBG: No results found for this basename: GLUCAP,  in the last 168 hours     Signed:  Linday Rhodes  [facility] 10/21/2012, 4:15 PM

## 2012-10-22 ENCOUNTER — Emergency Department (HOSPITAL_COMMUNITY)
Admission: EM | Admit: 2012-10-22 | Discharge: 2012-10-22 | Disposition: A | Discharge status: Home / Self Care | Payer: Medicare Other | Attending: Emergency Medicine | Admitting: Emergency Medicine

## 2012-10-22 ENCOUNTER — Encounter (HOSPITAL_COMMUNITY): Payer: Self-pay | Admitting: Adult Health

## 2012-10-22 DIAGNOSIS — R339 Retention of urine, unspecified: Secondary | ICD-10-CM | POA: Diagnosis not present

## 2012-10-22 DIAGNOSIS — I69919 Unspecified symptoms and signs involving cognitive functions following unspecified cerebrovascular disease: Secondary | ICD-10-CM | POA: Diagnosis not present

## 2012-10-22 DIAGNOSIS — G309 Alzheimer's disease, unspecified: Secondary | ICD-10-CM | POA: Insufficient documentation

## 2012-10-22 DIAGNOSIS — Z862 Personal history of diseases of the blood and blood-forming organs and certain disorders involving the immune mechanism: Secondary | ICD-10-CM | POA: Diagnosis not present

## 2012-10-22 DIAGNOSIS — F0280 Dementia in other diseases classified elsewhere without behavioral disturbance: Secondary | ICD-10-CM | POA: Insufficient documentation

## 2012-10-22 DIAGNOSIS — Z8673 Personal history of transient ischemic attack (TIA), and cerebral infarction without residual deficits: Secondary | ICD-10-CM | POA: Insufficient documentation

## 2012-10-22 DIAGNOSIS — Z8639 Personal history of other endocrine, nutritional and metabolic disease: Secondary | ICD-10-CM | POA: Insufficient documentation

## 2012-10-22 DIAGNOSIS — Z79899 Other long term (current) drug therapy: Secondary | ICD-10-CM | POA: Insufficient documentation

## 2012-10-22 DIAGNOSIS — I69319 Unspecified symptoms and signs involving cognitive functions following cerebral infarction: Secondary | ICD-10-CM

## 2012-10-22 HISTORY — DX: Renal tubulo-interstitial disease, unspecified: N15.9

## 2012-10-22 LAB — URINE MICROSCOPIC-ADD ON

## 2012-10-22 LAB — URINALYSIS, ROUTINE W REFLEX MICROSCOPIC
Bilirubin Urine: NEGATIVE
Ketones, ur: NEGATIVE mg/dL
Nitrite: NEGATIVE
Specific Gravity, Urine: 1.014 (ref 1.005–1.030)
Urobilinogen, UA: 0.2 mg/dL (ref 0.0–1.0)
pH: 5 (ref 5.0–8.0)

## 2012-10-22 LAB — FOLATE RBC: RBC Folate: 908 ng/mL — ABNORMAL HIGH (ref >=366)

## 2012-10-22 NOTE — ED Notes (Addendum)
Per family pt has not urinated since midnight. Pt is restless, c/o  Lower abdominal pain, HR 120s.  Recently D/c for kidney infection from [facility].

## 2012-10-22 NOTE — ED Notes (Signed)
Patient with acute urinary retention.  Family states patient has not urinated since midnight.  Brief was dry upon eval for foley placement.

## 2012-10-22 NOTE — ED Provider Notes (Signed)
History     CSN: [id number]  Arrival date & time 10/22/12  1906   First MD Initiated Contact with Patient 10/22/12 1923      Chief Complaint  Patient presents with  . Urinary Retention    (Consider location/radiation/quality/duration/timing/severity/associated sxs/prior treatment) HPI Level 5 caveat due to dementia Pt recently admitted for AMS found to have urinary retention, UTI, enlarged prostate. He was treated for same, had voiding trial and foley was removed prior to discharge. He went home yesterday with plan for outpatient followup at the [place]. His family member at the bedside reports he has not been able to urinate since midnight last night. Has appeared uncomfortable at times, no vomiting or fever. He has been taking his medications as prescribed including Abx and Flomax.  Past Medical History  Diagnosis Date  . Alzheimers disease   . Gout   . CVA (cerebral infarction)   . Kidney infection     Past Surgical History  Procedure Laterality Date  . Cataract extraction Left 2012  . Mouth surgery  01/2012    tooth extractions and bone shaved     Family History  Problem Relation Age of Onset  . High blood pressure Sister   . Diabetes Neg Hx     History  Substance Use Topics  . Smoking status: Never Smoker   . Smokeless tobacco: Not on file  . Alcohol Use: No      Review of Systems All other systems reviewed and are negative except as noted in HPI.   Allergies  Review of patient's allergies indicates no known allergies.  Home Medications   Current Outpatient Rx  Name  Route  Sig  Dispense  Refill  . amLODipine (NORVASC) 10 MG tablet   Oral   Take 1 tablet (10 mg total) by mouth daily.   30 tablet   0   . levofloxacin (LEVAQUIN) 500 MG tablet   Oral   Take 1 tablet (500 mg total) by mouth daily. For 5 days   5 tablet   0   . tamsulosin (FLOMAX) 0.4 MG CAPS   Oral   Take 1 capsule (0.4 mg total) by mouth daily.   30 capsule   0   . fish  oil-omega-3 fatty acids 1000 MG capsule   Oral   Take 1 g by mouth daily.         . Multiple Vitamin (MULTIVITAMIN WITH MINERALS) TABS   Oral   Take 1 tablet by mouth daily.         Marland Kitchen OVER THE COUNTER MEDICATION   Oral   Take by mouth daily with breakfast. Cherry juice           BP 159/84  Pulse 118  Temp(Src) 97.6 F (36.4 C) (Oral)  Resp 16  SpO2 100%  Physical Exam  Nursing note and vitals reviewed. Constitutional: He appears well-developed and well-nourished.  HENT:  Head: Normocephalic and atraumatic.  Eyes: EOM are normal. Pupils are equal, round, and reactive to light.  Neck: Normal range of motion. Neck supple.  Cardiovascular: Normal rate, normal heart sounds and intact distal pulses.   Pulmonary/Chest: Effort normal and breath sounds normal.  Abdominal: Bowel sounds are normal. He exhibits mass (distended bladder). There is tenderness (over bladder). There is no rebound and no guarding.  Musculoskeletal: Normal range of motion. He exhibits no edema and no tenderness.  Neurological: He is alert. He has normal strength. No cranial nerve deficit or sensory deficit.  Skin:  Skin is warm and dry. No rash noted.  Psychiatric: He has a normal mood and affect.    ED Course  Procedures (including critical care time)  Labs Reviewed  URINALYSIS, ROUTINE W REFLEX MICROSCOPIC - Abnormal; Notable for the following:    APPearance HAZY (*)    Hgb urine dipstick LARGE (*)    Leukocytes, UA TRACE (*)    All other components within normal limits  URINE MICROSCOPIC-ADD ON - Abnormal; Notable for the following:    Bacteria, UA FEW (*)    All other components within normal limits  URINE CULTURE   No results found.   1. CVA, old, cognitive deficits   2. Urinary retention       MDM  Foley drained about 500-600cc clear urine. No infection. Place in leg bag and advised follow up with either [facility], [facility], [facility] or local Urology if unable to get a timely fashion.          December Hedtke B. Bernette Mayers, MD 10/22/12 2225

## 2012-10-23 LAB — URINE CULTURE: Culture: NO GROWTH

## 2012-10-26 ENCOUNTER — Emergency Department (HOSPITAL_COMMUNITY)
Admission: EM | Admit: 2012-10-26 | Discharge: 2012-10-26 | Disposition: A | Discharge status: Home / Self Care | Payer: Medicare Other | Attending: Emergency Medicine | Admitting: Emergency Medicine

## 2012-10-26 DIAGNOSIS — T83091A Other mechanical complication of indwelling urethral catheter, initial encounter: Secondary | ICD-10-CM | POA: Insufficient documentation

## 2012-10-26 DIAGNOSIS — Z862 Personal history of diseases of the blood and blood-forming organs and certain disorders involving the immune mechanism: Secondary | ICD-10-CM | POA: Insufficient documentation

## 2012-10-26 DIAGNOSIS — F0280 Dementia in other diseases classified elsewhere without behavioral disturbance: Secondary | ICD-10-CM | POA: Insufficient documentation

## 2012-10-26 DIAGNOSIS — Z87440 Personal history of urinary (tract) infections: Secondary | ICD-10-CM | POA: Insufficient documentation

## 2012-10-26 DIAGNOSIS — R339 Retention of urine, unspecified: Secondary | ICD-10-CM | POA: Insufficient documentation

## 2012-10-26 DIAGNOSIS — Z79899 Other long term (current) drug therapy: Secondary | ICD-10-CM | POA: Insufficient documentation

## 2012-10-26 DIAGNOSIS — G309 Alzheimer's disease, unspecified: Secondary | ICD-10-CM | POA: Insufficient documentation

## 2012-10-26 DIAGNOSIS — Z8673 Personal history of transient ischemic attack (TIA), and cerebral infarction without residual deficits: Secondary | ICD-10-CM | POA: Diagnosis not present

## 2012-10-26 DIAGNOSIS — Y846 Urinary catheterization as the cause of abnormal reaction of the patient, or of later complication, without mention of misadventure at the time of the procedure: Secondary | ICD-10-CM | POA: Insufficient documentation

## 2012-10-26 DIAGNOSIS — Z8639 Personal history of other endocrine, nutritional and metabolic disease: Secondary | ICD-10-CM | POA: Insufficient documentation

## 2012-10-26 DIAGNOSIS — T83021A Displacement of indwelling urethral catheter, initial encounter: Secondary | ICD-10-CM

## 2012-10-26 NOTE — ED Provider Notes (Signed)
History     CSN: [id number]  Arrival date & time 10/26/12  0158   First MD Initiated Contact with Patient 10/26/12 [phone]      No chief complaint on file.   (Consider location/radiation/quality/duration/timing/severity/associated sxs/prior treatment) HPI Comments: Patient has a Foley catheter due to urinary tract, infection, and urinary retention, which is due to be taken out on Monday, today, was noted to be not draining well after patient pulled on it.  There is some blood and clots in the Foley bag.  His bladder scan shows that he is 280 cc of urine in his bladder, although he does not appear to be uncomfortable.  At this point.  The history is provided by a relative.    Past Medical History  Diagnosis Date  . Alzheimers disease   . Gout   . CVA (cerebral infarction)   . Kidney infection     Past Surgical History  Procedure Laterality Date  . Cataract extraction Left 2012  . Mouth surgery  01/2012    tooth extractions and bone shaved     Family History  Problem Relation Age of Onset  . High blood pressure Sister   . Diabetes Neg Hx     History  Substance Use Topics  . Smoking status: Never Smoker   . Smokeless tobacco: Not on file  . Alcohol Use: No      Review of Systems  Unable to perform ROS: Dementia  Constitutional: Negative for fever.  Genitourinary: Positive for decreased urine volume.  All other systems reviewed and are negative.    Allergies  Review of patient's allergies indicates no known allergies.  Home Medications   Current Outpatient Rx  Name  Route  Sig  Dispense  Refill  . amLODipine (NORVASC) 10 MG tablet   Oral   Take 10 mg by mouth every morning.         . fish oil-omega-3 fatty acids 1000 MG capsule   Oral   Take 1 g by mouth every morning.          Marland Kitchen levofloxacin (LEVAQUIN) 500 MG tablet   Oral   Take 1 tablet (500 mg total) by mouth daily. For 5 days   5 tablet   0   . Multiple Vitamin (MULTIVITAMIN WITH MINERALS)  TABS   Oral   Take 1 tablet by mouth every morning.          . tamsulosin (FLOMAX) 0.4 MG CAPS   Oral   Take 1 capsule (0.4 mg total) by mouth daily.   30 capsule   0     BP 148/64  Pulse 95  Temp(Src) 98.1 F (36.7 C) (Oral)  Resp 16  Ht 5\' 8"  (1.727 m)  Wt 160 lb (72.576 kg)  BMI 24.33 kg/m2  SpO2 99%  Physical Exam  Vitals reviewed. Constitutional: He appears well-developed and well-nourished.  HENT:  Head: Normocephalic.  Eyes: Pupils are equal, round, and reactive to light.  Neck: Normal range of motion.  Cardiovascular: Normal rate.   Pulmonary/Chest: Effort normal.  Abdominal: Soft. He exhibits no distension.  Foley leg bag has primary code, urine, and several blood clots, and the bag.  There also is a blood clot at the end of the Foley itself.  Patient.  Does not appear to be uncomfortable.  There is no active bleeding from the meatus  Genitourinary: Penis normal. No penile tenderness.  Neurological: He is alert.  Skin: Skin is warm.    ED Course  Procedures (including critical care time)  Labs Reviewed - No data to display No results found.   1. Dislodged Foley catheter, initial encounter       MDM   Irrigation of the Foley attempted fluid can be instilled, but not withdrawn, Foley catheter has been removed, followed by a large number of clots Patient is urinating.  Is adult attends spoke with daughter is comfortable taking him home and keeping the appointment on Monday with urology for followup       Arman Filter, NP 10/26/12 [phone]

## 2012-10-26 NOTE — ED Notes (Signed)
Pt alert to self family at the bedside. D/C home instructions given. Will monitor.

## 2012-10-26 NOTE — ED Notes (Signed)
Bladder irrigation done with no return urine. Bright red blood clots noted. Dondra Spry NP made aware. Order to d/c f/c. And place condom cath. Pt is INC of urine. Condom cath still placed for correct intake and output.Will monitor.

## 2012-10-26 NOTE — ED Notes (Signed)
Pt is alert to self pulled at fc at home. Bright red bloody drainage noted with blood clots. Bladder scan done >161cc of residual urine noted.  Family at bedside. FC was due to be removed this Monday in MD's office. Pt is resting will monitor.

## 2012-10-26 NOTE — ED Provider Notes (Signed)
Medical screening examination/treatment/procedure(s) were performed by non-physician practitioner and as supervising physician I was immediately available for consultation/collaboration.  Findlay Dagher R. Ernestyne Caldwell, MD 10/26/12 0748 

## 2012-10-28 DIAGNOSIS — R310 Gross hematuria: Secondary | ICD-10-CM | POA: Diagnosis not present

## 2012-10-28 DIAGNOSIS — R972 Elevated prostate specific antigen [PSA]: Secondary | ICD-10-CM | POA: Diagnosis not present

## 2012-10-28 DIAGNOSIS — R339 Retention of urine, unspecified: Secondary | ICD-10-CM | POA: Diagnosis not present

## 2012-11-13 ENCOUNTER — Encounter (HOSPITAL_COMMUNITY): Payer: Self-pay | Admitting: Emergency Medicine

## 2012-11-13 ENCOUNTER — Emergency Department (INDEPENDENT_AMBULATORY_CARE_PROVIDER_SITE_OTHER)
Admission: EM | Admit: 2012-11-13 | Discharge: 2012-11-13 | Disposition: A | Discharge status: Home / Self Care | Payer: Medicare Other | Source: Home / Self Care | Attending: Emergency Medicine | Admitting: Emergency Medicine

## 2012-11-13 DIAGNOSIS — M25473 Effusion, unspecified ankle: Secondary | ICD-10-CM

## 2012-11-13 DIAGNOSIS — M25471 Effusion, right ankle: Secondary | ICD-10-CM

## 2012-11-13 DIAGNOSIS — M25476 Effusion, unspecified foot: Secondary | ICD-10-CM

## 2012-11-13 NOTE — ED Notes (Addendum)
Daughter brings pt in for intermittent right leg swelling onset 2 weeks... Hx of gout and dimentia Sxs today include: intermittent pain and swelling.... Denies: fevers... He is alert w/no signs of acute distress... Ambulated well to exam room.

## 2012-11-13 NOTE — ED Notes (Signed)
Vitals entered by Tami Kirby (intern) 

## 2012-11-13 NOTE — ED Provider Notes (Signed)
History     CSN: [id number]  Arrival date & time 11/13/12  1236   First MD Initiated Contact with Patient 11/13/12 1306      Chief Complaint  Patient presents with  . Leg Swelling    (Consider location/radiation/quality/duration/timing/severity/associated sxs/prior treatment) HPI Comments: She brings patient in to be checked to make sure he has not developed a " blood clot on his right leg". She described it in the last several days swelling has gone down and at this point is mainly noticeable around his right ankle. He does have gout as well with not a lot of recent flareups. Patient was recently hospitalized for a complicated urinary tract infection and prostate problems.  She describes a daily he doesn't seem to be complaining of any pain and the swelling has come down except to the dorsal aspect and on top of his right ankle.  Patient is a 79 y.o. male presenting with leg pain. The history is provided by the spouse.  Leg Pain Location:  Leg Leg location:  R leg Pain details:    Severity:  Mild   Onset quality:  Gradual   Duration:  5 days Chronicity:  Recurrent Exacerbated by: Massaging her right foot and ankle and leg. Associated symptoms: swelling   Associated symptoms: no back pain, no decreased ROM, no fever, no stiffness and no tingling   Risk factors: recent illness   Risk factors: no concern for non-accidental trauma and no frequent fractures     Past Medical History  Diagnosis Date  . Alzheimers disease   . Gout   . CVA (cerebral infarction)   . Kidney infection     Past Surgical History  Procedure Laterality Date  . Cataract extraction Left 2012  . Mouth surgery  01/2012    tooth extractions and bone shaved     Family History  Problem Relation Age of Onset  . High blood pressure Sister   . Diabetes Neg Hx     History  Substance Use Topics  . Smoking status: Never Smoker   . Smokeless tobacco: Not on file  . Alcohol Use: No      Review of  Systems  Constitutional: Negative for fever, chills, activity change and appetite change.  Musculoskeletal: Positive for joint swelling and arthralgias. Negative for myalgias, back pain and stiffness.  Skin: Negative for color change, pallor, rash and wound.    Allergies  Review of patient's allergies indicates no known allergies.  Home Medications   Current Outpatient Rx  Name  Route  Sig  Dispense  Refill  . amLODipine (NORVASC) 10 MG tablet   Oral   Take 10 mg by mouth every morning.         . fish oil-omega-3 fatty acids 1000 MG capsule   Oral   Take 1 g by mouth every morning.          Marland Kitchen levofloxacin (LEVAQUIN) 500 MG tablet   Oral   Take 1 tablet (500 mg total) by mouth daily. For 5 days   5 tablet   0   . Multiple Vitamin (MULTIVITAMIN WITH MINERALS) TABS   Oral   Take 1 tablet by mouth every morning.          . tamsulosin (FLOMAX) 0.4 MG CAPS   Oral   Take 1 capsule (0.4 mg total) by mouth daily.   30 capsule   0     BP 140/75  Pulse 74  Temp(Src) 98.1 F (36.7 C) (Oral)  Resp 16  SpO2 99%  Physical Exam  Nursing note and vitals reviewed. Constitutional: He appears well-developed and well-nourished. No distress.  Musculoskeletal: He exhibits no tenderness.       Legs: Neurological: He is alert.  Skin: No rash noted. No erythema.    ED Course  Procedures (including critical care time)  Labs Reviewed - No data to display No results found.   1. Swelling of ankle joint, right       MDM  Exam somewhat most consistent with- resolving gout/arthritis of right ankle foot region. Have instructed relative about symptoms that should weren't further evaluation for rule out of a DVT. Which includes swelling of lower extremity beyond ankle joint. Cause pain her circumferential difference. She understands and will proceed with follow up with his doctor at the [place] in swelling around ankle persists. She also understands that if any symptoms suggestive  of a DVT to go to the emergency department for rule out.        Jimmie Molly, MD 11/13/12 [phone]

## 2012-12-27 DIAGNOSIS — R972 Elevated prostate specific antigen [PSA]: Secondary | ICD-10-CM | POA: Diagnosis not present

## 2012-12-27 DIAGNOSIS — R339 Retention of urine, unspecified: Secondary | ICD-10-CM | POA: Diagnosis not present

## 2012-12-27 DIAGNOSIS — N400 Enlarged prostate without lower urinary tract symptoms: Secondary | ICD-10-CM | POA: Diagnosis not present

## 2012-12-27 DIAGNOSIS — R310 Gross hematuria: Secondary | ICD-10-CM | POA: Diagnosis not present

## 2013-02-03 DIAGNOSIS — R310 Gross hematuria: Secondary | ICD-10-CM | POA: Diagnosis not present

## 2013-02-10 DIAGNOSIS — R972 Elevated prostate specific antigen [PSA]: Secondary | ICD-10-CM | POA: Diagnosis not present

## 2013-02-10 DIAGNOSIS — N400 Enlarged prostate without lower urinary tract symptoms: Secondary | ICD-10-CM | POA: Diagnosis not present

## 2013-03-31 DIAGNOSIS — R310 Gross hematuria: Secondary | ICD-10-CM | POA: Diagnosis not present

## 2013-04-08 DIAGNOSIS — R972 Elevated prostate specific antigen [PSA]: Secondary | ICD-10-CM | POA: Diagnosis not present

## 2013-04-08 DIAGNOSIS — N400 Enlarged prostate without lower urinary tract symptoms: Secondary | ICD-10-CM | POA: Diagnosis not present

## 2013-04-14 DIAGNOSIS — R972 Elevated prostate specific antigen [PSA]: Secondary | ICD-10-CM | POA: Diagnosis not present

## 2013-04-14 DIAGNOSIS — N400 Enlarged prostate without lower urinary tract symptoms: Secondary | ICD-10-CM | POA: Diagnosis not present

## 2013-04-14 DIAGNOSIS — R82998 Other abnormal findings in urine: Secondary | ICD-10-CM | POA: Diagnosis not present

## 2013-05-15 ENCOUNTER — Encounter (HOSPITAL_COMMUNITY): Payer: Self-pay | Admitting: Emergency Medicine

## 2013-05-15 ENCOUNTER — Emergency Department (INDEPENDENT_AMBULATORY_CARE_PROVIDER_SITE_OTHER)
Admission: EM | Admit: 2013-05-15 | Discharge: 2013-05-15 | Disposition: A | Discharge status: Home / Self Care | Payer: Medicare Other | Source: Home / Self Care | Attending: Family Medicine | Admitting: Family Medicine

## 2013-05-15 DIAGNOSIS — R109 Unspecified abdominal pain: Secondary | ICD-10-CM

## 2013-05-15 MED ORDER — POLYETHYLENE GLYCOL 3350 17 GM/SCOOP PO POWD: 1.0000 | Freq: Once | ORAL | Status: DC

## 2013-05-15 NOTE — ED Notes (Signed)
Pain in right side, onset 5 days ago.  Patient's family reports urine is normal in appearance.  Patient did fall approx one week ago, but fell against another family member, so he did not fall to floor or hit anything.  Family member reports change in bowels, history of constipation

## 2013-05-15 NOTE — ED Provider Notes (Signed)
Douglas Mckee is a 79 y.o. male who presents to Urgent Care today for right flank pain. This is been present for last 2 days. There has been no injury recently. He did fall about a week and half ago but that was on the other side against a family member. He has not been complaining of any dysuria urgency or frequency. His family notes the pain is intermittent. He has been constipated recently. His family has use prune juice which seems to not help very much. His last bowel movement was Monday.  No fevers or chills nausea vomiting or diarrhea.   Past Medical History  Diagnosis Date  . Alzheimers disease   . Gout   . CVA (cerebral infarction)   . Kidney infection    History  Substance Use Topics  . Smoking status: Never Smoker   . Smokeless tobacco: Not on file  . Alcohol Use: No   ROS as above Medications reviewed. No current facility-administered medications for this encounter.   Current Outpatient Prescriptions  Medication Sig Dispense Refill  . amLODipine (NORVASC) 10 MG tablet Take 10 mg by mouth every morning.      . fish oil-omega-3 fatty acids 1000 MG capsule Take 1 g by mouth every morning.       Marland Kitchen levofloxacin (LEVAQUIN) 500 MG tablet Take 1 tablet (500 mg total) by mouth daily. For 5 days  5 tablet  0  . Multiple Vitamin (MULTIVITAMIN WITH MINERALS) TABS Take 1 tablet by mouth every morning.       . polyethylene glycol powder (MIRALAX) powder Take 255 g (1 Container total) by mouth once.  850 g  0  . tamsulosin (FLOMAX) 0.4 MG CAPS Take 1 capsule (0.4 mg total) by mouth daily.  30 capsule  0    Exam:  BP 132/96  Pulse 84  Temp(Src) 97.6 F (36.4 C) (Oral)  Resp 12  SpO2 100% Gen: Well NAD HEENT: EOMI,  MMM Lungs: Normal work of breathing. CTABL Heart: RRR no MRG Abd: NABS, Soft. NT, ND, complete nontender flank no guarding or rebound Exts: Non edematous BL  LE, warm and well perfused.   Patient was unable provide urine sample. His daughter stated that he had to leave  after about 30 minutes of waiting for urine sample  No results found for this or any previous visit (from the past 24 hour(s)). No results found.  Assessment and Plan: 79 y.o. male with right flank pain unclear etiology. Probably constipation. Will treat MiraLAX. If not improving followup with primary care provider. Discussed warning signs or symptoms. Please see discharge instructions. Patient expresses understanding.      Rodolph Bong, MD 05/15/13 [phone]

## 2013-07-07 DIAGNOSIS — R972 Elevated prostate specific antigen [PSA]: Secondary | ICD-10-CM | POA: Diagnosis not present

## 2013-08-11 DIAGNOSIS — R972 Elevated prostate specific antigen [PSA]: Secondary | ICD-10-CM | POA: Diagnosis not present

## 2013-08-11 DIAGNOSIS — N400 Enlarged prostate without lower urinary tract symptoms: Secondary | ICD-10-CM | POA: Diagnosis not present

## 2013-09-10 DIAGNOSIS — R310 Gross hematuria: Secondary | ICD-10-CM | POA: Diagnosis not present

## 2013-10-13 DIAGNOSIS — R972 Elevated prostate specific antigen [PSA]: Secondary | ICD-10-CM | POA: Diagnosis not present

## 2013-10-13 DIAGNOSIS — R82998 Other abnormal findings in urine: Secondary | ICD-10-CM | POA: Diagnosis not present

## 2013-10-13 DIAGNOSIS — R310 Gross hematuria: Secondary | ICD-10-CM | POA: Diagnosis not present

## 2013-12-08 DIAGNOSIS — I6350 Cerebral infarction due to unspecified occlusion or stenosis of unspecified cerebral artery: Secondary | ICD-10-CM | POA: Diagnosis not present

## 2013-12-08 DIAGNOSIS — F0390 Unspecified dementia without behavioral disturbance: Secondary | ICD-10-CM | POA: Diagnosis not present

## 2013-12-08 DIAGNOSIS — R404 Transient alteration of awareness: Secondary | ICD-10-CM | POA: Diagnosis not present

## 2013-12-08 DIAGNOSIS — R079 Chest pain, unspecified: Secondary | ICD-10-CM | POA: Diagnosis not present

## 2014-01-26 DIAGNOSIS — R972 Elevated prostate specific antigen [PSA]: Secondary | ICD-10-CM | POA: Diagnosis not present

## 2014-01-26 DIAGNOSIS — N400 Enlarged prostate without lower urinary tract symptoms: Secondary | ICD-10-CM | POA: Diagnosis not present

## 2014-02-12 DIAGNOSIS — N401 Enlarged prostate with lower urinary tract symptoms: Secondary | ICD-10-CM | POA: Diagnosis not present

## 2014-02-12 DIAGNOSIS — R310 Gross hematuria: Secondary | ICD-10-CM | POA: Diagnosis not present

## 2014-02-12 DIAGNOSIS — R972 Elevated prostate specific antigen [PSA]: Secondary | ICD-10-CM | POA: Diagnosis not present

## 2014-04-21 DIAGNOSIS — R55 Syncope and collapse: Secondary | ICD-10-CM | POA: Diagnosis not present

## 2014-07-20 DIAGNOSIS — N402 Nodular prostate without lower urinary tract symptoms: Secondary | ICD-10-CM | POA: Diagnosis not present

## 2014-11-06 ENCOUNTER — Encounter (HOSPITAL_COMMUNITY): Payer: Self-pay | Admitting: Emergency Medicine

## 2014-11-06 ENCOUNTER — Emergency Department (INDEPENDENT_AMBULATORY_CARE_PROVIDER_SITE_OTHER): Payer: Medicare Other

## 2014-11-06 ENCOUNTER — Emergency Department (INDEPENDENT_AMBULATORY_CARE_PROVIDER_SITE_OTHER)
Admission: EM | Admit: 2014-11-06 | Discharge: 2014-11-06 | Disposition: A | Discharge status: Other Acute Inpatient Hospital | Payer: Medicare Other | Source: Home / Self Care | Attending: Emergency Medicine | Admitting: Emergency Medicine

## 2014-11-06 ENCOUNTER — Emergency Department (HOSPITAL_COMMUNITY)
Admission: EM | Admit: 2014-11-06 | Discharge: 2014-11-07 | Disposition: A | Discharge status: Home / Self Care | Payer: Medicare Other | Attending: Emergency Medicine | Admitting: Emergency Medicine

## 2014-11-06 DIAGNOSIS — Z87448 Personal history of other diseases of urinary system: Secondary | ICD-10-CM | POA: Insufficient documentation

## 2014-11-06 DIAGNOSIS — R059 Cough, unspecified: Secondary | ICD-10-CM

## 2014-11-06 DIAGNOSIS — R0902 Hypoxemia: Secondary | ICD-10-CM

## 2014-11-06 DIAGNOSIS — R05 Cough: Secondary | ICD-10-CM

## 2014-11-06 DIAGNOSIS — G309 Alzheimer's disease, unspecified: Secondary | ICD-10-CM | POA: Insufficient documentation

## 2014-11-06 DIAGNOSIS — R5383 Other fatigue: Secondary | ICD-10-CM | POA: Diagnosis not present

## 2014-11-06 DIAGNOSIS — J9811 Atelectasis: Secondary | ICD-10-CM | POA: Diagnosis not present

## 2014-11-06 DIAGNOSIS — Z8739 Personal history of other diseases of the musculoskeletal system and connective tissue: Secondary | ICD-10-CM | POA: Diagnosis not present

## 2014-11-06 DIAGNOSIS — R0602 Shortness of breath: Secondary | ICD-10-CM | POA: Insufficient documentation

## 2014-11-06 DIAGNOSIS — Z8673 Personal history of transient ischemic attack (TIA), and cerebral infarction without residual deficits: Secondary | ICD-10-CM | POA: Diagnosis not present

## 2014-11-06 DIAGNOSIS — F0280 Dementia in other diseases classified elsewhere without behavioral disturbance: Secondary | ICD-10-CM | POA: Insufficient documentation

## 2014-11-06 DIAGNOSIS — R112 Nausea with vomiting, unspecified: Secondary | ICD-10-CM | POA: Diagnosis present

## 2014-11-06 DIAGNOSIS — Z79899 Other long term (current) drug therapy: Secondary | ICD-10-CM | POA: Insufficient documentation

## 2014-11-06 LAB — CBC WITH DIFFERENTIAL/PLATELET
BASOS ABS: 0 10*3/uL (ref 0.0–0.1)
Basophils Relative: 0 % (ref 0–1)
Eosinophils Absolute: 0 10*3/uL (ref 0.0–0.7)
Eosinophils Relative: 0 % (ref 0–5)
HEMATOCRIT: 35.8 % — AB (ref 39.0–52.0)
Hemoglobin: 11.8 g/dL — ABNORMAL LOW (ref 13.0–17.0)
Lymphocytes Relative: 8 % — ABNORMAL LOW (ref 12–46)
Lymphs Abs: 1.2 10*3/uL (ref 0.7–4.0)
MCH: 23.2 pg — ABNORMAL LOW (ref 26.0–34.0)
MCHC: 33 g/dL (ref 30.0–36.0)
MCV: 70.5 fL — ABNORMAL LOW (ref 78.0–100.0)
MONOS PCT: 11 % (ref 3–12)
Monocytes Absolute: 1.7 10*3/uL — ABNORMAL HIGH (ref 0.1–1.0)
NEUTROS ABS: 12.3 10*3/uL — AB (ref 1.7–7.7)
NEUTROS PCT: 81 % — AB (ref 43–77)
PLATELETS: 328 10*3/uL (ref 150–400)
RBC: 5.08 MIL/uL (ref 4.22–5.81)
RDW: 16.6 % — AB (ref 11.5–15.5)
WBC: 15.2 10*3/uL — AB (ref 4.0–10.5)

## 2014-11-06 LAB — I-STAT VENOUS BLOOD GAS, ED
ACID-BASE EXCESS: 1 mmol/L (ref 0.0–2.0)
BICARBONATE: 27.7 meq/L — AB (ref 20.0–24.0)
O2 Saturation: 81 %
PCO2 VEN: 53 mmHg — AB (ref 45.0–50.0)
TCO2: 29 mmol/L (ref 0–100)
pH, Ven: 7.326 — ABNORMAL HIGH (ref 7.250–7.300)
pO2, Ven: 50 mmHg — ABNORMAL HIGH (ref 30.0–45.0)

## 2014-11-06 LAB — BASIC METABOLIC PANEL
ANION GAP: 12 (ref 5–15)
BUN: 22 mg/dL — ABNORMAL HIGH (ref 6–20)
CHLORIDE: 103 mmol/L (ref 101–111)
CO2: 26 mmol/L (ref 22–32)
Calcium: 8.6 mg/dL — ABNORMAL LOW (ref 8.9–10.3)
Creatinine, Ser: 1.1 mg/dL (ref 0.61–1.24)
GFR, EST NON AFRICAN AMERICAN: 57 mL/min — AB (ref >60)
Glucose, Bld: 104 mg/dL — ABNORMAL HIGH (ref 65–99)
POTASSIUM: 4.1 mmol/L (ref 3.5–5.1)
Sodium: 141 mmol/L (ref 135–145)

## 2014-11-06 LAB — I-STAT TROPONIN, ED: TROPONIN I, POC: 0 ng/mL (ref 0.00–0.08)

## 2014-11-06 LAB — BRAIN NATRIURETIC PEPTIDE: B Natriuretic Peptide: 671.5 pg/mL — ABNORMAL HIGH (ref 0.0–100.0)

## 2014-11-06 MED ORDER — BENZONATATE 100 MG PO CAPS: 100.0000 mg | ORAL_CAPSULE | Freq: Three times a day (TID) | ORAL | Status: DC

## 2014-11-06 MED ORDER — SODIUM CHLORIDE 0.9 % IV SOLN
Freq: Once | INTRAVENOUS | Status: AC
  Administered 2014-11-06: 21:00:00 via INTRAVENOUS

## 2014-11-06 MED ORDER — AZITHROMYCIN 250 MG PO TABS: 250.0000 mg | ORAL_TABLET | Freq: Every day | ORAL | Status: DC

## 2014-11-06 NOTE — Discharge Instructions (Signed)
Take azithromycin as prescribed. You may take Tessalon as prescribed for cough. Follow up with your primary care doctor for recheck of symptoms. Be sure that you drink plenty of fluids to remain well hydrated.  Cough, Adult  A cough is a reflex that helps clear your throat and airways. It can help heal the body or may be a reaction to an irritated airway. A cough may only last 2 or 3 weeks (acute) or may last more than 8 weeks (chronic).  CAUSES Acute cough:  Viral or bacterial infections. Chronic cough:  Infections.  Allergies.  Asthma.  Post-nasal drip.  Smoking.  Heartburn or acid reflux.  Some medicines.  Chronic lung problems (COPD).  Cancer. SYMPTOMS   Cough.  Fever.  Chest pain.  Increased breathing rate.  High-pitched whistling sound when breathing (wheezing).  Colored mucus that you cough up (sputum). TREATMENT   A bacterial cough may be treated with antibiotic medicine.  A viral cough must run its course and will not respond to antibiotics.  Your caregiver may recommend other treatments if you have a chronic cough. HOME CARE INSTRUCTIONS   Only take over-the-counter or prescription medicines for pain, discomfort, or fever as directed by your caregiver. Use cough suppressants only as directed by your caregiver.  Use a cold steam vaporizer or humidifier in your bedroom or home to help loosen secretions.  Sleep in a semi-upright position if your cough is worse at night.  Rest as needed.  Stop smoking if you smoke. SEEK IMMEDIATE MEDICAL CARE IF:   You have pus in your sputum.  Your cough starts to worsen.  You cannot control your cough with suppressants and are losing sleep.  You begin coughing up blood.  You have difficulty breathing.  You develop pain which is getting worse or is uncontrolled with medicine.  You have a fever. MAKE SURE YOU:   Understand these instructions.  Will watch your condition.  Will get help right away if  you are not doing well or get worse. Document Released: 11/11/2010 Document Revised: 08/07/2011 Document Reviewed: 11/11/2010 [facility] Patient Information 2015 [place], [place]. This information is not intended to replace advice given to you by your health care provider. Make sure you discuss any questions you have with your health care provider.

## 2014-11-06 NOTE — ED Provider Notes (Signed)
CSN: [id number]     Arrival date & time 11/06/14  1735 History   First MD Initiated Contact with Patient 11/06/14 1842     Chief Complaint  Patient presents with  . Cough   (Consider location/radiation/quality/duration/timing/severity/associated sxs/prior Treatment) HPI  He is an 81 year old man here with his daughter for evaluation of cough. History is provided by the daughter as he has Alzheimer's. She states he was sick with a virus for the last 3-4 weeks. He is mostly doing better at this time, but continues to have cough. His daughter states he is eating and drinking normally. He is moving a little bit slower than normal, which she attributes to recovering from his infection. She denies any fevers or chills. No vomiting.  No shortness of breath. No complaints of chest pain.   Past Medical History  Diagnosis Date  . Alzheimers disease   . Gout   . CVA (cerebral infarction)   . Kidney infection    Past Surgical History  Procedure Laterality Date  . Cataract extraction Left 2012  . Mouth surgery  01/2012    tooth extractions and bone shaved    Family History  Problem Relation Age of Onset  . High blood pressure Sister   . Diabetes Neg Hx    History  Substance Use Topics  . Smoking status: Never Smoker   . Smokeless tobacco: Not on file  . Alcohol Use: No    Review of Systems As in history of present illness Allergies  Review of patient's allergies indicates no known allergies.  Home Medications   Prior to Admission medications   Medication Sig Start Date End Date Taking? Authorizing Provider  amLODipine (NORVASC) 10 MG tablet Take 10 mg by mouth every morning. 10/20/12   Domenic Polite, MD  fish oil-omega-3 fatty acids 1000 MG capsule Take 1 g by mouth every morning.     Historical Provider, MD  levofloxacin (LEVAQUIN) 500 MG tablet Take 1 tablet (500 mg total) by mouth daily. For 5 days 10/20/12   Domenic Polite, MD  Multiple Vitamin (MULTIVITAMIN WITH MINERALS) TABS  Take 1 tablet by mouth every morning.     Historical Provider, MD  polyethylene glycol powder (MIRALAX) powder Take 255 g (1 Container total) by mouth once. 05/15/13   Gregor Hams, MD  tamsulosin (FLOMAX) 0.4 MG CAPS Take 1 capsule (0.4 mg total) by mouth daily. 10/20/12   Domenic Polite, MD   BP 154/81 mmHg  Pulse 93  Temp(Src) 99.3 F (37.4 C) (Oral)  Resp 16  SpO2 86% Physical Exam  Constitutional: He appears well-developed and well-nourished. No distress.  Neck: Neck supple.  Cardiovascular: Normal rate, regular rhythm and normal heart sounds.   No murmur heard. Pulmonary/Chest: Effort normal and breath sounds normal. No respiratory distress. He has no wheezes. He has no rales.  Exam limited as patient did not take deep breaths.  Neurological: He is alert.    ED Course  Procedures (including critical care time) Labs Review Labs Reviewed - No data to display  Imaging Review Dg Chest 2 View  11/06/2014   CLINICAL DATA:  Acute onset of vomiting and diarrhea. Loss of appetite. Initial encounter.  EXAM: CHEST  2 VIEW  COMPARISON:  Chest radiograph performed 10/19/2012  FINDINGS: The lungs are well-aerated. Mild right basilar atelectasis is noted. There is no evidence of pleural effusion or pneumothorax. An apparent 8 mm calcified granuloma is noted at the right midlung zone.  The heart is normal in size;  the mediastinal contour is within normal limits. No acute osseous abnormalities are seen.  IMPRESSION: Mild right basilar atelectasis noted; lungs otherwise clear.   Electronically Signed   By: Garald Balding M.D.   On: 11/06/2014 19:42     MDM   1. Hypoxia   2. Cough    He has been difficult to get a good oxygen saturation waveform on.  His O2 sat is typically around 80, we did get it up to 86 at one point. X-ray is negative for pneumonia, but with his age, hypoxia, weakness, will transfer to [facility] emergency room for additional workup. He was placed on 2 L of oxygen prior  to transfer via EMS.    Melony Overly, MD 11/06/14 2005

## 2014-11-06 NOTE — ED Provider Notes (Signed)
CSN: [id number]     Arrival date & time 11/06/14  2101 History   First MD Initiated Contact with Patient 11/06/14 2134     Chief Complaint  Patient presents with  . Fatigue  . Nausea  . Emesis    LEVEL 5 CAVEAT SECONDARY TO DEMENTIA  (Consider location/radiation/quality/duration/timing/severity/associated sxs/prior Treatment) HPI Comments: Patient is an 81 year old male with a history of Alzheimer's dementia and CVA who presents to the emergency department from urgent care for further evaluation of cough. Daughter states that patient has been "sick with a cold" 3-4 weeks. She states the cough has been dry and intermittent. Patient developed vomiting and diarrhea 5 days ago. This was thought to be secondary to a virus. He has been having some residual fatigue as a result of his symptoms. Daughter states patient has been eating and drinking normally, but moving slower than normal; she attributes this to is recovering from his viral illness. He has not had any vomiting or diarrhea over the past 24 hours to the daughter's knowledge. No fever, chest pain, or abdominal pain. Patient does report some mild shortness of breath at present. He was sent to the ED from [facility] for evaluation of hypoxia with sats in the 80's. Patient satting 100% on RA during my encounter with him.  Patient is a 81 y.o. male presenting with vomiting. The history is provided by the patient. No language interpreter was used.  Emesis Associated symptoms: diarrhea (resolved)     Past Medical History  Diagnosis Date  . Alzheimers disease   . Gout   . CVA (cerebral infarction)   . Kidney infection    Past Surgical History  Procedure Laterality Date  . Cataract extraction Left 2012  . Mouth surgery  01/2012    tooth extractions and bone shaved    Family History  Problem Relation Age of Onset  . High blood pressure Sister   . Diabetes Neg Hx    History  Substance Use Topics  . Smoking status: Never Smoker   .  Smokeless tobacco: Not on file  . Alcohol Use: No    Review of Systems  Unable to perform ROS: Dementia  Constitutional: Positive for fatigue. Negative for fever.  Respiratory: Positive for cough and shortness of breath.   Gastrointestinal: Positive for vomiting (resolved) and diarrhea (resolved).    Allergies  Review of patient's allergies indicates no known allergies.  Home Medications   Prior to Admission medications   Medication Sig Start Date End Date Taking? Authorizing Provider  amLODipine (NORVASC) 10 MG tablet Take 10 mg by mouth every morning. 10/20/12   Domenic Polite, MD  azithromycin (ZITHROMAX) 250 MG tablet Take 1 tablet (250 mg total) by mouth daily. Take first 2 tablets together, then 1 every day until finished. 11/06/14   Antonietta Breach, PA-C  benzonatate (TESSALON) 100 MG capsule Take 1 capsule (100 mg total) by mouth every 8 (eight) hours. As needed for cough 11/06/14   Antonietta Breach, PA-C  fish oil-omega-3 fatty acids 1000 MG capsule Take 1 g by mouth every morning.     Historical Provider, MD  Multiple Vitamin (MULTIVITAMIN WITH MINERALS) TABS Take 1 tablet by mouth every morning.     Historical Provider, MD  polyethylene glycol powder (MIRALAX) powder Take 255 g (1 Container total) by mouth once. 05/15/13   Gregor Hams, MD  tamsulosin (FLOMAX) 0.4 MG CAPS Take 1 capsule (0.4 mg total) by mouth daily. 10/20/12   Domenic Polite, MD   BP  143/65 mmHg  Pulse 91  Temp(Src) 100.2 F (37.9 C) (Rectal)  Resp 17  Ht 5\' 9"  (1.753 m)  Wt 160 lb (72.576 kg)  BMI 23.62 kg/m2  SpO2 100%   Physical Exam  Constitutional: He appears well-developed and well-nourished. No distress.  Nontoxic/nonseptic appearing  HENT:  Head: Normocephalic and atraumatic.  Eyes: Conjunctivae and EOM are normal. No scleral icterus.  Neck: Normal range of motion.  Cardiovascular: Normal rate, regular rhythm and intact distal pulses.   Pulmonary/Chest: Effort normal and breath sounds normal. No  respiratory distress. He has no wheezes. He has no rales.  Lungs clear bilaterally. No accessory muscle use or signs of respiratory distress.  Abdominal: Soft. He exhibits no distension. There is no tenderness.  Musculoskeletal: Normal range of motion.  Neurological: He is alert. He exhibits normal muscle tone. Coordination normal.  GCS 15. Patient moving all extremities.  Skin: Skin is warm and dry. No rash noted. He is not diaphoretic. No erythema. No pallor.  Psychiatric: He has a normal mood and affect. His behavior is normal.  Nursing note and vitals reviewed.   ED Course  Procedures (including critical care time) Labs Review Labs Reviewed  CBC WITH DIFFERENTIAL/PLATELET - Abnormal; Notable for the following:    WBC 15.2 (*)    Hemoglobin 11.8 (*)    HCT 35.8 (*)    MCV 70.5 (*)    MCH 23.2 (*)    RDW 16.6 (*)    Neutrophils Relative % 81 (*)    Lymphocytes Relative 8 (*)    Neutro Abs 12.3 (*)    Monocytes Absolute 1.7 (*)    All other components within normal limits  BRAIN NATRIURETIC PEPTIDE - Abnormal; Notable for the following:    B Natriuretic Peptide 671.5 (*)    All other components within normal limits  BASIC METABOLIC PANEL - Abnormal; Notable for the following:    Glucose, Bld 104 (*)    BUN 22 (*)    Calcium 8.6 (*)    GFR calc non Af Amer 57 (*)    All other components within normal limits  I-STAT VENOUS BLOOD GAS, ED - Abnormal; Notable for the following:    pH, Ven 7.326 (*)    pCO2, Ven 53.0 (*)    pO2, Ven 50.0 (*)    Bicarbonate 27.7 (*)    All other components within normal limits  I-STAT TROPOININ, ED    Imaging Review Dg Chest 2 View  11/06/2014   CLINICAL DATA:  Acute onset of vomiting and diarrhea. Loss of appetite. Initial encounter.  EXAM: CHEST  2 VIEW  COMPARISON:  Chest radiograph performed 10/19/2012  FINDINGS: The lungs are well-aerated. Mild right basilar atelectasis is noted. There is no evidence of pleural effusion or pneumothorax.  An apparent 8 mm calcified granuloma is noted at the right midlung zone.  The heart is normal in size; the mediastinal contour is within normal limits. No acute osseous abnormalities are seen.  IMPRESSION: Mild right basilar atelectasis noted; lungs otherwise clear.   Electronically Signed   By: Garald Balding M.D.   On: 11/06/2014 19:42     EKG Interpretation   Date/Time:  Friday November 06 2014 21:30:43 EDT Ventricular Rate:  90 PR Interval:    QRS Duration: 91 QT Interval:  365 QTC Calculation: 447 R Axis:   -22 Text Interpretation:  Normal sinus rhythm Borderline left axis deviation  Anteroseptal infarct, old Nonspecific T abnormalities, inferior leads  Baseline wander in lead(s)  V5 Reconfirmed by Hazle Coca [phone]) on  11/06/2014 9:40:17 PM      MDM   Final diagnoses:  Cough    81 year old male presents to the emergency department for further evaluation of a cough. Patient transferred from urgent care over concern for hypoxia. Patient has no known oxygen requirement. Patient was placed on 3 L oxygen via nasal cannula during transfer. Oxygen use was discontinued during my encounter and patient maintained saturations of 100% on room air. He has had no hypoxia over his 2 hour ED stay while remaining on room air. No signs of respiratory distress.  Chest x-ray reviewed from urgent care. This shows no evidence of focal consolidation or pneumonia. Patient does have a mildly increasing leukocytosis and a rectal temperature of 100.5F. For these reasons, will cover for bronchitis with azithromycin. Patient given Tessalon for cough. EKG today is nonischemic and troponin level is 0.00. No concern for cardiac etiology; patient with no c/o chest pain.  No indication for further emergent workup at this time. Family desires discharge home. Will continue to manage symptoms as outpatient with aforementioned plan. Return precautions provided and daughter are agreeable to plan with no unaddressed  concerns. Patient seen also by my attending, Dr. Ralene Bathe, who is in agreement with this workup, assessment, management plan, and patient's stability for discharge.   Filed Vitals:   11/06/14 2230 11/06/14 2245 11/06/14 2315 11/06/14 2330  BP: 148/60 146/60 158/59 143/65  Pulse: 95 89 92 91  Temp:      TempSrc:      Resp: 22 16 19 17   Height:      Weight:      SpO2: 100% 99% 100% 100%     Antonietta Breach, PA-C 11/06/14 2344  Quintella Reichert, MD 11/08/14 0740

## 2014-11-06 NOTE — ED Notes (Signed)
Patients daughter brings him in due to cough. Patients daughter reports he has been sick for a couple of weeks with a viral URI and has been recovering. Patient is in NAD. Just weak from virus according to daughter so hes using a wheelchair.

## 2014-11-06 NOTE — ED Notes (Signed)
[facility] contacted for transport to main ED, CN called to advise of transport.

## 2014-11-06 NOTE — ED Notes (Addendum)
Pt brought by [facility] from Urgent care for further evaluation, pt been having weakness, nausea and vomiting for 2 weeks at home, pt is very cold to touch and O2 saturation while ambulated on urgent care of 80% RA, pt place on 3L O2 and O2 saturation up to 100%. Pt SR EMS EKG R-20, HR 80, BP 136/80. Family member at the bedside. NAD noticed. Pt base line is demented.

## 2015-01-20 DIAGNOSIS — N401 Enlarged prostate with lower urinary tract symptoms: Secondary | ICD-10-CM | POA: Diagnosis not present

## 2015-01-20 DIAGNOSIS — N138 Other obstructive and reflux uropathy: Secondary | ICD-10-CM | POA: Diagnosis not present

## 2015-08-06 ENCOUNTER — Emergency Department (HOSPITAL_COMMUNITY): Payer: Medicare Other

## 2015-08-06 ENCOUNTER — Inpatient Hospital Stay (HOSPITAL_COMMUNITY)
Admission: EM | Admit: 2015-08-06 | Discharge: 2015-08-12 | DRG: 871 | Disposition: A | Discharge status: Skilled Nursing Facility | Payer: Medicare Other | Attending: Internal Medicine | Admitting: Internal Medicine

## 2015-08-06 ENCOUNTER — Encounter (HOSPITAL_COMMUNITY): Payer: Self-pay

## 2015-08-06 DIAGNOSIS — R1312 Dysphagia, oropharyngeal phase: Secondary | ICD-10-CM | POA: Diagnosis not present

## 2015-08-06 DIAGNOSIS — S42212A Unspecified displaced fracture of surgical neck of left humerus, initial encounter for closed fracture: Secondary | ICD-10-CM | POA: Diagnosis present

## 2015-08-06 DIAGNOSIS — J101 Influenza due to other identified influenza virus with other respiratory manifestations: Secondary | ICD-10-CM | POA: Diagnosis present

## 2015-08-06 DIAGNOSIS — G309 Alzheimer's disease, unspecified: Secondary | ICD-10-CM | POA: Diagnosis present

## 2015-08-06 DIAGNOSIS — S0990XA Unspecified injury of head, initial encounter: Secondary | ICD-10-CM | POA: Diagnosis not present

## 2015-08-06 DIAGNOSIS — R05 Cough: Secondary | ICD-10-CM | POA: Diagnosis not present

## 2015-08-06 DIAGNOSIS — J690 Pneumonitis due to inhalation of food and vomit: Secondary | ICD-10-CM | POA: Diagnosis not present

## 2015-08-06 DIAGNOSIS — G9340 Encephalopathy, unspecified: Secondary | ICD-10-CM | POA: Diagnosis not present

## 2015-08-06 DIAGNOSIS — A419 Sepsis, unspecified organism: Secondary | ICD-10-CM | POA: Diagnosis not present

## 2015-08-06 DIAGNOSIS — G9341 Metabolic encephalopathy: Secondary | ICD-10-CM | POA: Diagnosis not present

## 2015-08-06 DIAGNOSIS — R1319 Other dysphagia: Secondary | ICD-10-CM | POA: Diagnosis present

## 2015-08-06 DIAGNOSIS — E861 Hypovolemia: Secondary | ICD-10-CM | POA: Diagnosis present

## 2015-08-06 DIAGNOSIS — W19XXXA Unspecified fall, initial encounter: Secondary | ICD-10-CM | POA: Diagnosis present

## 2015-08-06 DIAGNOSIS — N401 Enlarged prostate with lower urinary tract symptoms: Secondary | ICD-10-CM | POA: Diagnosis not present

## 2015-08-06 DIAGNOSIS — R488 Other symbolic dysfunctions: Secondary | ICD-10-CM | POA: Diagnosis not present

## 2015-08-06 DIAGNOSIS — N179 Acute kidney failure, unspecified: Secondary | ICD-10-CM | POA: Diagnosis not present

## 2015-08-06 DIAGNOSIS — M6281 Muscle weakness (generalized): Secondary | ICD-10-CM | POA: Diagnosis not present

## 2015-08-06 DIAGNOSIS — R000 Tachycardia, unspecified: Secondary | ICD-10-CM | POA: Diagnosis present

## 2015-08-06 DIAGNOSIS — D509 Iron deficiency anemia, unspecified: Secondary | ICD-10-CM | POA: Diagnosis present

## 2015-08-06 DIAGNOSIS — S42302S Unspecified fracture of shaft of humerus, left arm, sequela: Secondary | ICD-10-CM | POA: Diagnosis not present

## 2015-08-06 DIAGNOSIS — J181 Lobar pneumonia, unspecified organism: Secondary | ICD-10-CM

## 2015-08-06 DIAGNOSIS — S42302D Unspecified fracture of shaft of humerus, left arm, subsequent encounter for fracture with routine healing: Secondary | ICD-10-CM | POA: Diagnosis not present

## 2015-08-06 DIAGNOSIS — R627 Adult failure to thrive: Secondary | ICD-10-CM | POA: Diagnosis not present

## 2015-08-06 DIAGNOSIS — E860 Dehydration: Secondary | ICD-10-CM | POA: Diagnosis present

## 2015-08-06 DIAGNOSIS — R509 Fever, unspecified: Secondary | ICD-10-CM | POA: Diagnosis not present

## 2015-08-06 DIAGNOSIS — S42292A Other displaced fracture of upper end of left humerus, initial encounter for closed fracture: Secondary | ICD-10-CM | POA: Diagnosis not present

## 2015-08-06 DIAGNOSIS — Z8673 Personal history of transient ischemic attack (TIA), and cerebral infarction without residual deficits: Secondary | ICD-10-CM

## 2015-08-06 DIAGNOSIS — J189 Pneumonia, unspecified organism: Secondary | ICD-10-CM | POA: Diagnosis not present

## 2015-08-06 DIAGNOSIS — A413 Sepsis due to Hemophilus influenzae: Secondary | ICD-10-CM | POA: Diagnosis not present

## 2015-08-06 DIAGNOSIS — E872 Acidosis: Secondary | ICD-10-CM | POA: Diagnosis present

## 2015-08-06 DIAGNOSIS — J09X1 Influenza due to identified novel influenza A virus with pneumonia: Secondary | ICD-10-CM | POA: Diagnosis not present

## 2015-08-06 DIAGNOSIS — S42302A Unspecified fracture of shaft of humerus, left arm, initial encounter for closed fracture: Secondary | ICD-10-CM | POA: Diagnosis not present

## 2015-08-06 DIAGNOSIS — F0390 Unspecified dementia without behavioral disturbance: Secondary | ICD-10-CM | POA: Diagnosis not present

## 2015-08-06 DIAGNOSIS — T148 Other injury of unspecified body region: Secondary | ICD-10-CM | POA: Diagnosis not present

## 2015-08-06 DIAGNOSIS — I10 Essential (primary) hypertension: Secondary | ICD-10-CM | POA: Diagnosis not present

## 2015-08-06 DIAGNOSIS — R6520 Severe sepsis without septic shock: Secondary | ICD-10-CM | POA: Diagnosis not present

## 2015-08-06 DIAGNOSIS — R2681 Unsteadiness on feet: Secondary | ICD-10-CM | POA: Diagnosis not present

## 2015-08-06 DIAGNOSIS — M79602 Pain in left arm: Secondary | ICD-10-CM | POA: Diagnosis not present

## 2015-08-06 DIAGNOSIS — Z4789 Encounter for other orthopedic aftercare: Secondary | ICD-10-CM | POA: Diagnosis not present

## 2015-08-06 LAB — COMPREHENSIVE METABOLIC PANEL
ALBUMIN: 3.5 g/dL (ref 3.5–5.0)
ALK PHOS: 66 U/L (ref 38–126)
ALT: 20 U/L (ref 17–63)
ANION GAP: 11 (ref 5–15)
AST: 40 U/L (ref 15–41)
BUN: 30 mg/dL — ABNORMAL HIGH (ref 6–20)
CALCIUM: 8.4 mg/dL — AB (ref 8.9–10.3)
CHLORIDE: 99 mmol/L — AB (ref 101–111)
CO2: 25 mmol/L (ref 22–32)
CREATININE: 1.33 mg/dL — AB (ref 0.61–1.24)
GFR calc Af Amer: 53 mL/min — ABNORMAL LOW (ref >60)
GFR calc non Af Amer: 45 mL/min — ABNORMAL LOW (ref >60)
GLUCOSE: 152 mg/dL — AB (ref 65–99)
Potassium: 4.3 mmol/L (ref 3.5–5.1)
SODIUM: 135 mmol/L (ref 135–145)
Total Bilirubin: 1.1 mg/dL (ref 0.3–1.2)
Total Protein: 6.9 g/dL (ref 6.5–8.1)

## 2015-08-06 LAB — URINE MICROSCOPIC-ADD ON
Bacteria, UA: NONE SEEN
Squamous Epithelial / LPF: NONE SEEN

## 2015-08-06 LAB — CBC WITH DIFFERENTIAL/PLATELET
BASOS PCT: 0 %
Basophils Absolute: 0 10*3/uL (ref 0.0–0.1)
Eosinophils Absolute: 0 10*3/uL (ref 0.0–0.7)
Eosinophils Relative: 0 %
HEMATOCRIT: 35.3 % — AB (ref 39.0–52.0)
HEMOGLOBIN: 11.3 g/dL — AB (ref 13.0–17.0)
LYMPHS ABS: 1.3 10*3/uL (ref 0.7–4.0)
Lymphocytes Relative: 7 %
MCH: 22.8 pg — AB (ref 26.0–34.0)
MCHC: 32 g/dL (ref 30.0–36.0)
MCV: 71.3 fL — AB (ref 78.0–100.0)
MONO ABS: 1.6 10*3/uL — AB (ref 0.1–1.0)
MONOS PCT: 8 %
NEUTROS ABS: 16.6 10*3/uL — AB (ref 1.7–7.7)
NEUTROS PCT: 85 %
Platelets: 193 10*3/uL (ref 150–400)
RBC: 4.95 MIL/uL (ref 4.22–5.81)
RDW: 16.5 % — AB (ref 11.5–15.5)
WBC: 19.5 10*3/uL — ABNORMAL HIGH (ref 4.0–10.5)

## 2015-08-06 LAB — I-STAT CG4 LACTIC ACID, ED: Lactic Acid, Venous: 2.65 mmol/L (ref 0.5–2.0)

## 2015-08-06 LAB — URINALYSIS, ROUTINE W REFLEX MICROSCOPIC
Bilirubin Urine: NEGATIVE
GLUCOSE, UA: NEGATIVE mg/dL
KETONES UR: NEGATIVE mg/dL
LEUKOCYTES UA: NEGATIVE
Nitrite: NEGATIVE
PH: 5 (ref 5.0–8.0)
Protein, ur: 100 mg/dL — AB
SPECIFIC GRAVITY, URINE: 1.028 (ref 1.005–1.030)

## 2015-08-06 LAB — INFLUENZA PANEL BY PCR (TYPE A & B)
H1N1 flu by pcr: NOT DETECTED
Influenza A By PCR: POSITIVE — AB
Influenza B By PCR: NEGATIVE

## 2015-08-06 LAB — LACTIC ACID, PLASMA
LACTIC ACID, VENOUS: 1.5 mmol/L (ref 0.5–2.0)
Lactic Acid, Venous: 2.5 mmol/L (ref 0.5–2.0)

## 2015-08-06 MED ORDER — ONDANSETRON HCL 4 MG/2ML IJ SOLN: 4.0000 mg | Freq: Four times a day (QID) | INTRAMUSCULAR | Status: DC | PRN

## 2015-08-06 MED ORDER — ENOXAPARIN SODIUM 40 MG/0.4ML ~~LOC~~ SOLN
40.0000 mg | SUBCUTANEOUS | Status: DC
  Administered 2015-08-06 – 2015-08-11 (×6): 40 mg via SUBCUTANEOUS
  Filled 2015-08-06 (×6): qty 0.4

## 2015-08-06 MED ORDER — TAMSULOSIN HCL 0.4 MG PO CAPS
0.4000 mg | ORAL_CAPSULE | Freq: Every day | ORAL | Status: DC
  Administered 2015-08-07 – 2015-08-12 (×5): 0.4 mg via ORAL
  Filled 2015-08-06 (×7): qty 1

## 2015-08-06 MED ORDER — SODIUM CHLORIDE 0.9 % IV BOLUS (SEPSIS)
500.0000 mL | Freq: Once | INTRAVENOUS | Status: AC
  Administered 2015-08-06: 500 mL via INTRAVENOUS

## 2015-08-06 MED ORDER — ONDANSETRON HCL 4 MG PO TABS: 4.0000 mg | ORAL_TABLET | Freq: Four times a day (QID) | ORAL | Status: DC | PRN

## 2015-08-06 MED ORDER — SODIUM CHLORIDE 0.9% FLUSH
3.0000 mL | Freq: Two times a day (BID) | INTRAVENOUS | Status: DC
  Administered 2015-08-11 – 2015-08-12 (×2): 3 mL via INTRAVENOUS

## 2015-08-06 MED ORDER — MORPHINE SULFATE (PF) 2 MG/ML IV SOLN: 1.0000 mg | INTRAVENOUS | Status: DC | PRN

## 2015-08-06 MED ORDER — ACETAMINOPHEN 650 MG RE SUPP: 650.0000 mg | Freq: Four times a day (QID) | RECTAL | Status: DC | PRN

## 2015-08-06 MED ORDER — SODIUM CHLORIDE 0.9 % IV BOLUS (SEPSIS)
1000.0000 mL | Freq: Once | INTRAVENOUS | Status: AC
  Administered 2015-08-06: 1000 mL via INTRAVENOUS

## 2015-08-06 MED ORDER — OXYCODONE HCL 5 MG PO TABS: 5.0000 mg | ORAL_TABLET | ORAL | Status: DC | PRN

## 2015-08-06 MED ORDER — PIPERACILLIN-TAZOBACTAM 3.375 G IVPB 30 MIN
3.3750 g | Freq: Once | INTRAVENOUS | Status: AC
  Administered 2015-08-06: 3.375 g via INTRAVENOUS
  Filled 2015-08-06: qty 50

## 2015-08-06 MED ORDER — SODIUM CHLORIDE 0.9 % IV SOLN
1000.0000 mL | INTRAVENOUS | Status: DC
  Administered 2015-08-06 – 2015-08-07 (×3): 1000 mL via INTRAVENOUS

## 2015-08-06 MED ORDER — ACETAMINOPHEN 325 MG PO TABS
650.0000 mg | ORAL_TABLET | Freq: Four times a day (QID) | ORAL | Status: DC | PRN
  Administered 2015-08-07: 650 mg via ORAL
  Filled 2015-08-06: qty 2

## 2015-08-06 MED ORDER — DEXTROSE 5 % IV SOLN
500.0000 mg | Freq: Once | INTRAVENOUS | Status: AC
  Administered 2015-08-06: 500 mg via INTRAVENOUS
  Filled 2015-08-06: qty 500

## 2015-08-06 MED ORDER — PIPERACILLIN-TAZOBACTAM 3.375 G IVPB
3.3750 g | Freq: Three times a day (TID) | INTRAVENOUS | Status: DC
Start: 2015-08-07 — End: 2015-08-11
  Administered 2015-08-07 – 2015-08-11 (×13): 3.375 g via INTRAVENOUS
  Filled 2015-08-06 (×14): qty 50

## 2015-08-06 NOTE — Progress Notes (Signed)
82 yr old medicare male covered by [employer] Pcp Dr Jola Baptist Pt is at home with his wife and daughter to care for him Pt is independent with most ADLs per family and not needing equipment at this time  No needing any home health or DME per family

## 2015-08-06 NOTE — ED Provider Notes (Signed)
CSN: [id number]     Arrival date & time 08/06/15  42 History   First MD Initiated Contact with Patient 08/06/15 1336     Chief Complaint  Patient presents with  . Fever  . Arm Injury     (Consider location/radiation/quality/duration/timing/severity/associated sxs/prior Treatment) Patient is a 82 y.o. male presenting with fever. The history is provided by a relative and a caregiver.  Fever Temp source:  Subjective Severity:  Mild Onset quality:  Gradual Duration:  3 days Timing:  Constant Progression:  Worsening Chronicity:  New Relieved by:  None tried Worsened by:  Nothing tried Ineffective treatments:  None tried Associated symptoms: chills, confusion and cough   Associated symptoms: no chest pain, no dysuria and no rash     Past Medical History  Diagnosis Date  . Alzheimers disease   . Gout   . CVA (cerebral infarction)   . Kidney infection    Past Surgical History  Procedure Laterality Date  . Cataract extraction Left 2012  . Mouth surgery  01/2012    tooth extractions and bone shaved    Family History  Problem Relation Age of Onset  . High blood pressure Sister   . Diabetes Neg Hx    Social History  Substance Use Topics  . Smoking status: Never Smoker   . Smokeless tobacco: None  . Alcohol Use: No    Review of Systems  Unable to perform ROS: Dementia  Constitutional: Positive for fever and chills.  Respiratory: Positive for cough.   Cardiovascular: Negative for chest pain.  Genitourinary: Negative for dysuria.  Skin: Negative for rash.  Psychiatric/Behavioral: Positive for confusion.      Allergies  Review of patient's allergies indicates no known allergies.  Home Medications   Prior to Admission medications   Medication Sig Start Date End Date Taking? Authorizing Provider  fish oil-omega-3 fatty acids 1000 MG capsule Take 1 g by mouth every morning.    Yes Historical Provider, MD  Multiple Vitamin (MULTIVITAMIN WITH MINERALS) TABS Take 1  tablet by mouth every morning.    Yes Historical Provider, MD  Phenyleph-Doxylamine-DM-APAP (ALKA SELTZER PLUS PO) Take 1 each by mouth every 6 (six) hours as needed (flu symotoms).   Yes Historical Provider, MD  tamsulosin (FLOMAX) 0.4 MG CAPS Take 1 capsule (0.4 mg total) by mouth daily. 10/20/12  Yes Domenic Polite, MD   BP 130/74 mmHg  Pulse 109  Temp(Src) 98.6 F (37 C) (Axillary)  Resp 20  Ht 5\' 11"  (1.803 m)  Wt 160 lb 14.3 oz (72.98 kg)  BMI 22.45 kg/m2  SpO2 96% Physical Exam  Constitutional: He is oriented to person, place, and time. He appears well-developed and well-nourished.  HENT:  Head: Normocephalic and atraumatic.  Neck: Normal range of motion.  Cardiovascular: Normal rate.   Pulmonary/Chest: Effort normal. No respiratory distress. He has rales.  Abdominal: Soft. He exhibits no distension. There is no tenderness.  Musculoskeletal: Normal range of motion.  Neurological: He is alert and oriented to person, place, and time. No cranial nerve deficit.  Skin: No rash noted. No erythema.  Left upper arm with significant swelling and ecchymosis  Nursing note and vitals reviewed.   ED Course  Procedures (including critical care time)  CRITICAL CARE Performed by: Merrily Pew   Total critical care time: 35 minutes Critical care time was exclusive of separately billable procedures and treating other patients. Critical care was necessary to treat or prevent imminent or life-threatening deterioration. Critical care was time spent  personally by me on the following activities: development of treatment plan with patient and/or surrogate as well as nursing, discussions with consultants, evaluation of patient's response to treatment, examination of patient, obtaining history from patient or surrogate, ordering and performing treatments and interventions, ordering and review of laboratory studies, ordering and review of radiographic studies, pulse oximetry and re-evaluation of  patient's condition.   Labs Review Labs Reviewed  COMPREHENSIVE METABOLIC PANEL - Abnormal; Notable for the following:    Chloride 99 (*)    Glucose, Bld 152 (*)    BUN 30 (*)    Creatinine, Ser 1.33 (*)    Calcium 8.4 (*)    GFR calc non Af Amer 45 (*)    GFR calc Af Amer 53 (*)    All other components within normal limits  URINALYSIS, ROUTINE W REFLEX MICROSCOPIC (NOT AT [facility]) - Abnormal; Notable for the following:    APPearance CLOUDY (*)    Hgb urine dipstick LARGE (*)    Protein, ur 100 (*)    All other components within normal limits  CBC WITH DIFFERENTIAL/PLATELET - Abnormal; Notable for the following:    WBC 19.5 (*)    Hemoglobin 11.3 (*)    HCT 35.3 (*)    MCV 71.3 (*)    MCH 22.8 (*)    RDW 16.5 (*)    Neutro Abs 16.6 (*)    Monocytes Absolute 1.6 (*)    All other components within normal limits  BASIC METABOLIC PANEL - Abnormal; Notable for the following:    BUN 25 (*)    Calcium 8.0 (*)    All other components within normal limits  CBC - Abnormal; Notable for the following:    WBC 12.9 (*)    Hemoglobin 10.0 (*)    HCT 30.5 (*)    MCV 69.5 (*)    MCH 22.8 (*)    RDW 16.5 (*)    All other components within normal limits  INFLUENZA PANEL BY PCR (TYPE A & B, H1N1) - Abnormal; Notable for the following:    Influenza A By PCR POSITIVE (*)    All other components within normal limits  LACTIC ACID, PLASMA - Abnormal; Notable for the following:    Lactic Acid, Venous 2.5 (*)    All other components within normal limits  I-STAT CG4 LACTIC ACID, ED - Abnormal; Notable for the following:    Lactic Acid, Venous 2.65 (*)    All other components within normal limits  URINE CULTURE  CULTURE, BLOOD (ROUTINE X 2)  CULTURE, BLOOD (ROUTINE X 2)  URINE MICROSCOPIC-ADD ON  LACTIC ACID, PLASMA  CBC WITH DIFFERENTIAL/PLATELET  I-STAT CG4 LACTIC ACID, ED  I-STAT CG4 LACTIC ACID, ED  I-STAT CG4 LACTIC ACID, ED  I-STAT CG4 LACTIC ACID, ED    Imaging Review Dg Chest 2  View  08/07/2015  CLINICAL DATA:  Cough and congestion. EXAM: CHEST - 2 VIEW COMPARISON:  Two-view chest x-ray 08/06/2015. FINDINGS: The heart size is exaggerated by low lung volumes. A right basilar pneumonia is again noted. The proximal left humerus fracture is again seen. IMPRESSION: 1. Persistent right basilar pneumonia. 2. Proximal left humerus fracture. Electronically Signed   By: San Morelle M.D.   On: 08/07/2015 08:21   Dg Chest 2 View  08/06/2015  CLINICAL DATA:  Fever.  Recent fall EXAM: CHEST  2 VIEW COMPARISON:  11/06/2014 FINDINGS: Right lower lobe infiltrate is new and may represent pneumonia. Small right effusion Left lung is clear.  Negative for heart failure Fracture left humeral neck which appears acute. IMPRESSION: Right lower lobe infiltrate consistent with pneumonia Fracture left humeral neck Electronically Signed   By: Franchot Gallo M.D.   On: 08/06/2015 14:29   Ct Head Wo Contrast  08/06/2015  CLINICAL DATA:  [place] and hit head EXAM: CT HEAD WITHOUT CONTRAST TECHNIQUE: Contiguous axial images were obtained from the base of the skull through the vertex without intravenous contrast. COMPARISON:  CT head 10/18/2012 FINDINGS: Advanced atrophy. Chronic microvascular ischemic changes in the white matter. Negative for acute infarct. Negative for acute hemorrhage or mass lesion. Atherosclerotic calcifications circle of Willis. No acute skull abnormality. Negative for fracture. IMPRESSION: Atrophy and chronic ischemia.  No acute abnormality. Electronically Signed   By: Franchot Gallo M.D.   On: 08/06/2015 15:03   Dg Humerus Left  08/06/2015  CLINICAL DATA:  Fall on left on Wednesday EXAM: LEFT HUMERUS - 2+ VIEW COMPARISON:  None. FINDINGS: Four views of the left humerus submitted. There is minimal impacted comminuted fracture of the left humeral neck extending in humeral head. IMPRESSION: There is minimal impacted comminuted fracture of the left humeral neck extending in humeral  head. Electronically Signed   By: Lahoma Crocker M.D.   On: 08/06/2015 14:28   I have personally reviewed and evaluated these images and lab results as part of my medical decision-making.   EKG Interpretation None      MDM   Diagnosis: Community Acquired Pneumonia Sepsis Humeral Neck fracture, acute, closed  82 year old male who fell a few days ago and sustained a left humeral neck fracture extending into the humeral head. Stiffness swelling and ecchymosis around that area neurovascularly intact distally. Discussed case with orthopedics and will likely be nonoperative management. She also with a fever cough congestion going on last 3-4 days. Family member the same. However on evaluation here is tachycardic to Has a low oxygenation. He has crackles in both of his lungs. Chest x-ray consistent with a right lower lobe infiltrate so started on azithromycin for community acquired pneumonia. Discussed case with the hospitalist and will admit for the same.    Merrily Pew, MD 08/07/15 1350

## 2015-08-06 NOTE — ED Notes (Signed)
PT CAN GO UP AT 16:45

## 2015-08-06 NOTE — ED Notes (Signed)
Bed: [id number] Expected date:  Expected time:  Means of arrival:  Comments: EMS-fever-broken arm

## 2015-08-06 NOTE — ED Notes (Signed)
Assisted patient with urinal to collect urine sample, patient was unsuccessful

## 2015-08-06 NOTE — ED Notes (Signed)
Per EMS, pt from home.  Pt fell on Wednesday at home.  Pt having discoloration, swelling and fever to rt arm.  Pt also has oral temp of 101.6.  Flu like symptoms since Monday.  Taking OTC meds.  Pt has radial pulse positive to distal arm.  Alert to baseline.  Vitals: 138/88, hr 115, cbg 163, 97%ra

## 2015-08-06 NOTE — ED Notes (Signed)
Patient not in room at this time, will inquire about urine sample when patient returns

## 2015-08-06 NOTE — Progress Notes (Signed)
CRITICAL VALUE ALERT  Critical value received:  Lactic Acid 2.5  Date of notification:  08/06/2015  Time of notification:  2025  Critical value read back:Yes.    Nurse who received alert:  Janell Quiet   MD notified (1st page):  Fredirick Maudlin   Time of first page:  2026  MD notified (2nd page):  Time of second page:  Responding MD:  Fredirick Maudlin   Time MD responded:  2028

## 2015-08-06 NOTE — ED Notes (Signed)
Phlebotomy at bedside at this time.

## 2015-08-06 NOTE — H&P (Signed)
[facility] History and Physical  Douglas Mckee [id number] DOB: 10/07/1922 DOA: 08/06/2015  Referring physician:  PCP: No PCP Per Patient   Chief Complaint: Functional decline  HPI: Douglas Mckee is a 82 y.o. male with a past medical history of dementia, at baseline he is ambulatory, resides in the community, attends the PACE program, able to feed himself, requires assistance with pain being and dressing, brought to the emergency department for functional decline. His family members reporting that he had a fall last Wednesday. That same day he vomited his orange juice. Since then family members reporting a steep functional decline as he has become less interactive, having minimal by mouth intake, excessive daytime sleepiness, having generalized weakness. Family members concerned about him having the flu since he was exposed to one of his daughters who had the flu. In the emergency room he was noted to have a hematoma over left upper extremity, imaging studies revealed fracture of left humeral neck. A chest x-ray showed right lower lobe infiltrate consistent with pneumonia. Labs showed elevated white count of 19,500 with lactic acid of 2.65. She was obtained from emergency room staff and family members present at bedside as he is unable to provide history.                               Review of Systems:  Unable to obtain reliable review of systems given advanced dementia and functional decline  Past Medical History  Diagnosis Date  . Alzheimers disease   . Gout   . CVA (cerebral infarction)   . Kidney infection    Past Surgical History  Procedure Laterality Date  . Cataract extraction Left 2012  . Mouth surgery  01/2012    tooth extractions and bone shaved    Social History:  reports that he has never smoked. He does not have any smokeless tobacco history on file. He reports that he does not drink alcohol or use illicit drugs.  No Known Allergies  Family History  Problem Relation  Age of Onset  . High blood pressure Sister   . Diabetes Neg Hx    *  Prior to Admission medications   Medication Sig Start Date End Date Taking? Authorizing Provider  fish oil-omega-3 fatty acids 1000 MG capsule Take 1 g by mouth every morning.    Yes Historical Provider, MD  Multiple Vitamin (MULTIVITAMIN WITH MINERALS) TABS Take 1 tablet by mouth every morning.    Yes Historical Provider, MD  Phenyleph-Doxylamine-DM-APAP (ALKA SELTZER PLUS PO) Take 1 each by mouth every 6 (six) hours as needed (flu symotoms).   Yes Historical Provider, MD  tamsulosin (FLOMAX) 0.4 MG CAPS Take 1 capsule (0.4 mg total) by mouth daily. 10/20/12  Yes Domenic Polite, MD   Physical Exam: Filed Vitals:   08/06/15 1332 08/06/15 1456 08/06/15 1522 08/06/15 1626  BP:   171/139 174/92  Pulse:   114 116  Temp:      TempSrc:      Resp:  25 27 19   SpO2: 97% 100% 96% 99%    Wt Readings from Last 3 Encounters:  11/06/14 72.576 kg (160 lb)  10/26/12 72.576 kg (160 lb)  10/19/12 73.3 kg (161 lb 9.6 oz)    General:  Elderly gentleman, ill-appearing, nonverbal during my encounter, unable to follow commands. Coarse respiratory sounds Eyes: PERRL, normal lids, irises & conjunctiva ENT: grossly normal hearing, lips & tongue Neck: no LAD, masses or thyromegaly  Cardiovascular: Tachycardic, RRR, no m/r/g. No LE edema. Telemetry: SR, no arrhythmias  Respiratory: Poor respiratory effort, coarse respiratory sounds, right-sided rhonchi and crackles Abdomen: soft, ntnd Skin: no rash or induration seen on limited exam Musculoskeletal: There is hematoma and swelling involving proximal left upper extremity, painful with passive and active movement. Psychiatric: Patient with dementia nonverbal. Neurologic: Could not perform adequate neurologic examination given advanced dementia and patient unable to follow commands for me.           Labs on Admission:  Basic Metabolic Panel:  Recent Labs Lab 08/06/15 1502  NA 135    K 4.3  CL 99*  CO2 25  GLUCOSE 152*  BUN 30*  CREATININE 1.33*  CALCIUM 8.4*   Liver Function Tests:  Recent Labs Lab 08/06/15 1502  AST 40  ALT 20  ALKPHOS 66  BILITOT 1.1  PROT 6.9  ALBUMIN 3.5   No results for input(s): LIPASE, AMYLASE in the last 168 hours. No results for input(s): AMMONIA in the last 168 hours. CBC:  Recent Labs Lab 08/06/15 1512  WBC 19.5*  NEUTROABS 16.6*  HGB 11.3*  HCT 35.3*  MCV 71.3*  PLT 193   Cardiac Enzymes: No results for input(s): CKTOTAL, CKMB, CKMBINDEX, TROPONINI in the last 168 hours.  BNP (last 3 results)  Recent Labs  11/06/14 2205  BNP 671.5*    ProBNP (last 3 results) No results for input(s): PROBNP in the last 8760 hours.  CBG: No results for input(s): GLUCAP in the last 168 hours.  Radiological Exams on Admission: Dg Chest 2 View  08/06/2015  CLINICAL DATA:  Fever.  Recent fall EXAM: CHEST  2 VIEW COMPARISON:  11/06/2014 FINDINGS: Right lower lobe infiltrate is new and may represent pneumonia. Small right effusion Left lung is clear.  Negative for heart failure Fracture left humeral neck which appears acute. IMPRESSION: Right lower lobe infiltrate consistent with pneumonia Fracture left humeral neck Electronically Signed   By: Franchot Gallo M.D.   On: 08/06/2015 14:29   Ct Head Wo Contrast  08/06/2015  CLINICAL DATA:  [place] and hit head EXAM: CT HEAD WITHOUT CONTRAST TECHNIQUE: Contiguous axial images were obtained from the base of the skull through the vertex without intravenous contrast. COMPARISON:  CT head 10/18/2012 FINDINGS: Advanced atrophy. Chronic microvascular ischemic changes in the white matter. Negative for acute infarct. Negative for acute hemorrhage or mass lesion. Atherosclerotic calcifications circle of Willis. No acute skull abnormality. Negative for fracture. IMPRESSION: Atrophy and chronic ischemia.  No acute abnormality. Electronically Signed   By: Franchot Gallo M.D.   On: 08/06/2015 15:03    Dg Humerus Left  08/06/2015  CLINICAL DATA:  Fall on left on Wednesday EXAM: LEFT HUMERUS - 2+ VIEW COMPARISON:  None. FINDINGS: Four views of the left humerus submitted. There is minimal impacted comminuted fracture of the left humeral neck extending in humeral head. IMPRESSION: There is minimal impacted comminuted fracture of the left humeral neck extending in humeral head. Electronically Signed   By: Lahoma Crocker M.D.   On: 08/06/2015 14:28    EKG: Independently reviewed.   Assessment/Plan Principal Problem:   Sepsis ([place]) Active Problems:   Pneumonia   Metabolic encephalopathy   Dementia   AKI (acute kidney injury) ([place])   Left humeral fracture   1. Sepsis. Present on admission, evidenced by a white count of 19,500, lactate of 2.65, acute kidney injury, heart rate of 117, with source of infection likely to be aspiration pneumonia versus community-acquired pneumonia.  Chest x-ray revealed a right lower lobe infiltrate. Will place this [place] under the sepsis protocol, repeat lactic acid levels, obtain blood cultures, provide IV fluid resuscitation and empiric IV antibiotic therapy with Zosyn. Plan to repeat chest x-ray in a.m. 2. Suspected aspiration pneumonia. Mr. Allor family members reporting he had a fall on Wednesday, later on in the day he vomited his orange juice. A chest x-ray performed in the emergency room showing the development of a right lower lobe infiltrate. Will make him NPO until he is evaluated by speech pathology. Meanwhile will cover with empiric IV Zosyn. Repeat a chest x-ray in a.m. He had been exposed to family with influenza and family feels that he may have had flulike symptoms. Will check an influenza swab.  3. Functional decline/failure to thrive. At baseline family members reporting that he has a history of dementia, had been living in the community, attending  PACE program. Likely caused by underlying infection, sepsis along with dehydration. Will provide IV fluid  resuscitation and empiric IV antibiotic therapy. I have placed a physical therapy consultation. 4. Acute kidney injury. Lab work showing a creatinine 1.33 with BUN of 30, previous labs in 2016 had shown crit and 1.1 with BUN of 22. Provide IV fluid resuscitation, monitor volume status closely. Renal failure likely secondary to hypovolemia/prerenal azotemia.  5. Left humeral neck fracture. Mr. Mizelle having a fall last Wednesday in his bathroom which probably caused his fracture. ED MD spoke with Dr Ninfa Linden of Orthopedic Surgery who recommended nonoperative mangement    Code Status: Code status discussed with family members at bedside, he is a full code DVT Prophylaxis: Lovenox Family Communication: I spoke with 2 daughters at bedside Disposition Plan: Anticipate patient will require greater than 2 night hospitalization  Time spent: 68 min  Kelvin Cellar [facility] Pager [phone]

## 2015-08-06 NOTE — Progress Notes (Signed)
Pharmacy Antibiotic Note  Douglas Mckee is a 82 y.o. male admitted on 08/06/2015 with sepsis, RLL pnemonia with possible aspiration. Pharmacy has been consulted for Zosyn dosing.  Plan: Zosyn 3.375g IV q8h (4 hour infusion).  Follow up renal function & cultures De-escalate as appropriate    Temp (24hrs), Avg:99.2 F (37.3 C), Min:98.4 F (36.9 C), Max:100.6 F (38.1 C)   Recent Labs Lab 08/06/15 1502 08/06/15 1512 08/06/15 1520  WBC  --  19.5*  --   CREATININE 1.33*  --   --   LATICACIDVEN  --   --  2.65*    CrCl cannot be calculated (Unknown ideal weight.).   CrCl 37 ml/min/1.62m2 (normalized)  No Known Allergies  Antimicrobials this admission: 3/10 >> azithromycin x 1 3/10 >> zosyn >>  Dose adjustments this admission: ---  Microbiology results: 3/10 BCx: sent 3/10 UCx: sent  Thank you for allowing pharmacy to be a part of this patient's care.  Peggyann Juba, PharmD, BCPS Pager: [phone] 08/06/2015 6:08 PM

## 2015-08-06 NOTE — ED Notes (Signed)
Pt in radiology dept at this time

## 2015-08-07 ENCOUNTER — Inpatient Hospital Stay (HOSPITAL_COMMUNITY): Payer: Medicare Other

## 2015-08-07 DIAGNOSIS — J1100 Influenza due to unidentified influenza virus with unspecified type of pneumonia: Secondary | ICD-10-CM

## 2015-08-07 LAB — BASIC METABOLIC PANEL
Anion gap: 9 (ref 5–15)
BUN: 25 mg/dL — AB (ref 6–20)
CALCIUM: 8 mg/dL — AB (ref 8.9–10.3)
CO2: 24 mmol/L (ref 22–32)
CREATININE: 1.02 mg/dL (ref 0.61–1.24)
Chloride: 105 mmol/L (ref 101–111)
GFR calc Af Amer: >60 mL/min (ref >60)
GFR calc non Af Amer: >60 mL/min (ref >60)
GLUCOSE: 92 mg/dL (ref 65–99)
Potassium: 4 mmol/L (ref 3.5–5.1)
Sodium: 138 mmol/L (ref 135–145)

## 2015-08-07 LAB — CBC
HEMATOCRIT: 30.5 % — AB (ref 39.0–52.0)
Hemoglobin: 10 g/dL — ABNORMAL LOW (ref 13.0–17.0)
MCH: 22.8 pg — AB (ref 26.0–34.0)
MCHC: 32.8 g/dL (ref 30.0–36.0)
MCV: 69.5 fL — AB (ref 78.0–100.0)
Platelets: 151 10*3/uL (ref 150–400)
RBC: 4.39 MIL/uL (ref 4.22–5.81)
RDW: 16.5 % — AB (ref 11.5–15.5)
WBC: 12.9 10*3/uL — ABNORMAL HIGH (ref 4.0–10.5)

## 2015-08-07 MED ORDER — SODIUM CHLORIDE 0.9 % IV SOLN
1000.0000 mL | INTRAVENOUS | Status: DC
  Administered 2015-08-08 (×2): 1000 mL via INTRAVENOUS

## 2015-08-07 MED ORDER — OSELTAMIVIR PHOSPHATE 30 MG PO CAPS
30.0000 mg | ORAL_CAPSULE | Freq: Two times a day (BID) | ORAL | Status: DC
  Administered 2015-08-07: 30 mg via ORAL
  Filled 2015-08-07 (×2): qty 1

## 2015-08-07 MED ORDER — OSELTAMIVIR PHOSPHATE 6 MG/ML PO SUSR
30.0000 mg | Freq: Two times a day (BID) | ORAL | Status: AC
  Administered 2015-08-07 – 2015-08-11 (×9): 30 mg via ORAL
  Filled 2015-08-07 (×10): qty 5

## 2015-08-07 MED ORDER — IBUPROFEN 200 MG PO TABS
400.0000 mg | ORAL_TABLET | Freq: Once | ORAL | Status: AC
  Administered 2015-08-07: 400 mg via ORAL
  Filled 2015-08-07: qty 2

## 2015-08-07 MED ORDER — RESOURCE THICKENUP CLEAR PO POWD
ORAL | Status: DC | PRN
Start: 2015-08-07 — End: 2015-08-12
  Filled 2015-08-07: qty 125

## 2015-08-07 NOTE — Progress Notes (Signed)
PT Cancellation Note  Patient Details Name: Douglas Mckee MRN: [id number] DOB: July 26, 1922   Cancelled Treatment:     Will hold evaluation today, pt not feeling well, and fever still high. Will check tomorrow.    Clide Dales 08/07/2015, 4:20 PM

## 2015-08-07 NOTE — Evaluation (Signed)
Clinical/Bedside Swallow Evaluation Patient Details  Name: Douglas Mckee MRN: [id number] Date of Birth: [birth year]  Today's Date: 08/07/2015 Time: SLP Start Time (ACUTE ONLY): 1413 SLP Stop Time (ACUTE ONLY): 1427 SLP Time Calculation (min) (ACUTE ONLY): 14 min  Past Medical History:  Past Medical History  Diagnosis Date  . Alzheimers disease   . Gout   . CVA (cerebral infarction)   . Kidney infection    Past Surgical History:  Past Surgical History  Procedure Laterality Date  . Cataract extraction Left 2012  . Mouth surgery  01/2012    tooth extractions and bone shaved    HPI:  Douglas Mckee is a 82 y.o. male with a past medical history of dementia, at baseline he is ambulatory, resides in the community, attends the PACE program, able to feed himself, requires assistance with pain being and dressing, brought to the emergency department for functional decline. His family members reporting that he had a fall last Wednesday. That same day he vomited his orange juice. Since then family members reporting a steep functional decline as he has become less interactive, having minimal by mouth intake, excessive daytime sleepiness, having generalized weakness. Chest x-ray showing presence of right lower lobe infiltrate. Flu swab positive for influenza A. Pt suspected to have sepsis from source of infection likely to be aspiration pneumonia/Influenza.    Assessment / Plan / Recommendation Clinical Impression  Pt demonstrates a baseline dysphagia secondary to dementia characterized by a prolonged oral phase with pumping/mastication. Swallow is delayed with suspected bolus pooling in pharynx prior to swallow, followed by coughing. SLP provided tactile cues for small bolus size, but ultimately pt required nectar thick liquids to reduce but not eliminate coughing. Discussed basic precautions with family at bedside and recommend to RN that pills be crushed given the difficulty pt had with oral transit under  observation. Will initiate a dys 1 (puree) diet with nectar thick liquids and f/u for toelrance/upgrade as pt improves.     Aspiration Risk  Moderate aspiration risk    Diet Recommendation Dysphagia 1 (Puree);Nectar-thick liquid   Liquid Administration via: Cup;Straw Medication Administration: Crushed with puree Supervision: Staff to assist with self feeding;Full supervision/cueing for compensatory strategies Compensations: Slow rate;Small sips/bites Postural Changes: Seated upright at 90 degrees    Other  Recommendations Oral Care Recommendations: Oral care BID Other Recommendations: Order thickener from pharmacy   Follow up Recommendations  24 hour supervision/assistance    Frequency and Duration min 2x/week  2 weeks       Prognosis Prognosis for Safe Diet Advancement: Good Barriers to Reach Goals: Cognitive deficits      Swallow Study   General HPI: Douglas Mckee is a 82 y.o. male with a past medical history of dementia, at baseline he is ambulatory, resides in the community, attends the PACE program, able to feed himself, requires assistance with pain being and dressing, brought to the emergency department for functional decline. His family members reporting that he had a fall last Wednesday. That same day he vomited his orange juice. Since then family members reporting a steep functional decline as he has become less interactive, having minimal by mouth intake, excessive daytime sleepiness, having generalized weakness. Chest x-ray showing presence of right lower lobe infiltrate. Flu swab positive for influenza A. Pt suspected to have sepsis from source of infection likely to be aspiration pneumonia/Influenza.  Type of Study: Bedside Swallow Evaluation Previous Swallow Assessment: none Diet Prior to this Study: NPO Temperature Spikes Noted: Yes Respiratory Status: Room  air History of Recent Intubation: No Behavior/Cognition: Alert;Pleasant mood;Confused Oral Cavity Assessment:  Within Functional Limits Oral Care Completed by SLP: No Oral Cavity - Dentition: Edentulous;Dentures, not available Vision: Functional for self-feeding Self-Feeding Abilities: Total assist Patient Positioning: Upright in bed Baseline Vocal Quality: Normal Volitional Cough: Congested;Weak Volitional Swallow: Able to elicit    Oral/Motor/Sensory Function Overall Oral Motor/Sensory Function: Within functional limits   Ice Chips     Thin Liquid Thin Liquid: Impaired Presentation: Cup;Straw Pharyngeal  Phase Impairments: Cough - Immediate;Cough - Delayed;Suspected delayed Swallow    Nectar Thick Nectar Thick Liquid: Impaired Presentation: Cup;Straw Pharyngeal Phase Impairments: Suspected delayed Swallow;Cough - Delayed   Honey Thick Honey Thick Liquid: Not tested   Puree Puree: Impaired Presentation: Spoon Pharyngeal Phase Impairments: Cough - Delayed   Solid   GO   Solid: Not tested       Douglas [place], MA CCC-SLP [phone]  Douglas Mckee 08/07/2015,2:36 PM

## 2015-08-07 NOTE — Progress Notes (Signed)
PHARMACY - TAMIFLU  Patient known to pharmacy for dosing of Zosyn.  Pharmacy consulted to assist with dosing of Tamiflu for Influenza A +  Physician has ordered Tamiflu 30mg  BID x 5 days  Scr = 1.33 with CrCl = 38 ml/min  Assessment:  For CrCl 30-60 ml/min, Tamiflu dosage recommendation is 30mg  BID x 5 days.  Plan:  Continue Tamiflu 30mg  BID x 5 days as previously ordered by MD.   Thank you for allowing pharmacy to be a part of this patient's care.  Leone Haven, PharmD 08/07/2015 @ 06:16

## 2015-08-07 NOTE — Progress Notes (Signed)
[facility] PROGRESS NOTE  Princeton Carnahan [id number] DOB: [birth year] DOA: 08/06/2015 PCP: Verline Lema, MD  Assessment/Plan: 1. Sepsis -Present on admission, evidenced by a white count of 19,500, lactate of 2.65, acute kidney injury, heart rates in the 120s with source of infection likely to be aspiration pneumonia/Influenza -Chest x-ray showing presence of right lower lobe infiltrate. Flu swab positive for influenza A -Blood culture showing no growth after overnight incubation -Overnight lactic acid normalizing to 1.5 from 2.65 on presentation. -Repeat lab work showing a downward trend in his white count from 19,000 512,900. -Continue supportive care, IV fluids, IV antimicrobial therapy.  2.  Suspected aspiration pneumonia -Family members reporting vomiting on Orange juice last Wednesday. He has a history of advanced dementia. -Chest x-ray showing presence of right lower lobe infiltrate which could argue for aspiration pneumonia, as he presented septic. -Continue IV Zosyn -Lab work showing downward trend in white count from 19,500-12,900 on a.m. lab work -He was made nothing by mouth with SLP consultation been placed. -Continue supportive care  3.  Influenza A -Patient exposed to family member who had influenza. -Family members reporting functional decline and flulike symptoms Mr. Blaschke. -Overnight fluid came back positive -Started Tamiflu  4.  Functional decline/failure to thrive -This was likely precipitated by multiple factors including infection, acute kidney injury, he hydration -Continue IV fluids, IV antimicrobials, physical therapy consult. At baseline he resides in the community, has advanced dementia, has been participating in adult daycare, requires assistance with most activities of daily living.  5.  Acute kidney injury. -Initial lab work showing crit and 1.33 with BUN of 30. Likely secondary to critical illness/hypovolemia -Continue IV fluid resuscitation  follow-up on repeat labs  6.  Left humeral neck fracture -History of having a fall last Wednesday bathroom which likely causes fracture. -ED MD spoke with orthopedics recommended nonoperative management.  Code Status: Full code Family Communication: I spoke to family members present at bedside Disposition Plan: Continue IV antibiotics, IV fluids   Consultants:  Physical therapy  Antibiotics:  IV Zosyn  HPI/Subjective: Mr. Blahnik is a pleasant 82 year old gentleman with a past medical history of dementia, residing in the community, admitted to medicine service on 08/06/2015 when he presented with a functional decline. Family members reported him to fall last Wednesday in his bathroom. Since then they have reported him to be less interactive having minimal by mouth intake, excessive daytime sleepiness. We had also reported concerns over his exposure to a family member who had the flu. Initial workup included a chest x-ray that showed right lower lobe infiltrate consistent with pneumonia. Labs showed an elevated white count of 19,509 lactic acid 2.65. Patient having sepsis criteria. Placed on sepsis protocol, administered IV fluid resuscitation along with initiation of IV antibiotic therapy. Flu swab came back positive for informs 8.  Objective: Filed Vitals:   08/06/15 2249 08/07/15 0626  BP: 164/82 130/74  Pulse:  109  Temp: 98.7 F (37.1 C) 98.6 F (37 C)  Resp: 20 20    Intake/Output Summary (Last 24 hours) at 08/07/15 0733 Last data filed at 08/07/15 0700  Gross per 24 hour  Intake 1341.67 ml  Output      0 ml  Net 1341.67 ml   Filed Weights   08/06/15 1857  Weight: 72.98 kg (160 lb 14.3 oz)    Exam:   General:  Sleepy, difficult to arouse, family members present at bedside reporting no acute events overnight  Cardiovascular: Regular rate rhythm normal S1-S2 26  systolic ejection murmur  Respiratory: Normal respiratory effort, having coarse respiratory sounds,  right basilar crackles and rhonchi  Abdomen: Soft nontender nondistended  Musculoskeletal: No edema  Data Reviewed: Basic Metabolic Panel:  Recent Labs Lab 08/06/15 1502  NA 135  K 4.3  CL 99*  CO2 25  GLUCOSE 152*  BUN 30*  CREATININE 1.33*  CALCIUM 8.4*   Liver Function Tests:  Recent Labs Lab 08/06/15 1502  AST 40  ALT 20  ALKPHOS 66  BILITOT 1.1  PROT 6.9  ALBUMIN 3.5   No results for input(s): LIPASE, AMYLASE in the last 168 hours. No results for input(s): AMMONIA in the last 168 hours. CBC:  Recent Labs Lab 08/06/15 1512 08/07/15 0605  WBC 19.5* 12.9*  NEUTROABS 16.6*  --   HGB 11.3* 10.0*  HCT 35.3* 30.5*  MCV 71.3* 69.5*  PLT 193 151   Cardiac Enzymes: No results for input(s): CKTOTAL, CKMB, CKMBINDEX, TROPONINI in the last 168 hours. BNP (last 3 results)  Recent Labs  11/06/14 2205  BNP 671.5*    ProBNP (last 3 results) No results for input(s): PROBNP in the last 8760 hours.  CBG: No results for input(s): GLUCAP in the last 168 hours.  No results found for this or any previous visit (from the past 240 hour(s)).   Studies: Dg Chest 2 View  08/06/2015  CLINICAL DATA:  Fever.  Recent fall EXAM: CHEST  2 VIEW COMPARISON:  11/06/2014 FINDINGS: Right lower lobe infiltrate is new and may represent pneumonia. Small right effusion Left lung is clear.  Negative for heart failure Fracture left humeral neck which appears acute. IMPRESSION: Right lower lobe infiltrate consistent with pneumonia Fracture left humeral neck Electronically Signed   By: Franchot Gallo M.D.   On: 08/06/2015 14:29   Ct Head Wo Contrast  08/06/2015  CLINICAL DATA:  [place] and hit head EXAM: CT HEAD WITHOUT CONTRAST TECHNIQUE: Contiguous axial images were obtained from the base of the skull through the vertex without intravenous contrast. COMPARISON:  CT head 10/18/2012 FINDINGS: Advanced atrophy. Chronic microvascular ischemic changes in the white matter. Negative for acute  infarct. Negative for acute hemorrhage or mass lesion. Atherosclerotic calcifications circle of Willis. No acute skull abnormality. Negative for fracture. IMPRESSION: Atrophy and chronic ischemia.  No acute abnormality. Electronically Signed   By: Franchot Gallo M.D.   On: 08/06/2015 15:03   Dg Humerus Left  08/06/2015  CLINICAL DATA:  Fall on left on Wednesday EXAM: LEFT HUMERUS - 2+ VIEW COMPARISON:  None. FINDINGS: Four views of the left humerus submitted. There is minimal impacted comminuted fracture of the left humeral neck extending in humeral head. IMPRESSION: There is minimal impacted comminuted fracture of the left humeral neck extending in humeral head. Electronically Signed   By: Lahoma Crocker M.D.   On: 08/06/2015 14:28    Scheduled Meds: . enoxaparin (LOVENOX) injection  40 mg Subcutaneous Q24H  . oseltamivir  30 mg Oral BID  . piperacillin-tazobactam (ZOSYN)  IV  3.375 g Intravenous Q8H  . sodium chloride flush  3 mL Intravenous Q12H  . tamsulosin  0.4 mg Oral Daily   Continuous Infusions: . sodium chloride 1,000 mL (08/07/15 0021)    Principal Problem:   Sepsis ([place]) Active Problems:   Pneumonia   Metabolic encephalopathy   Dementia   AKI (acute kidney injury) ([place])   Left humeral fracture    Time spent: 35 min    Kelvin Cellar  [facility] Pager [phone] 7PM-7AM, please contact  night-coverage at [email or web address], password [email or web address] 08/07/2015, 7:33 AM  LOS: 1 day

## 2015-08-08 LAB — BASIC METABOLIC PANEL
ANION GAP: 10 (ref 5–15)
BUN: 31 mg/dL — ABNORMAL HIGH (ref 6–20)
CALCIUM: 7.9 mg/dL — AB (ref 8.9–10.3)
CHLORIDE: 110 mmol/L (ref 101–111)
CO2: 23 mmol/L (ref 22–32)
Creatinine, Ser: 1.13 mg/dL (ref 0.61–1.24)
GFR calc non Af Amer: 55 mL/min — ABNORMAL LOW (ref >60)
Glucose, Bld: 98 mg/dL (ref 65–99)
Potassium: 3.8 mmol/L (ref 3.5–5.1)
Sodium: 143 mmol/L (ref 135–145)

## 2015-08-08 LAB — URINE CULTURE: Culture: NO GROWTH

## 2015-08-08 LAB — CBC
HCT: 27.8 % — ABNORMAL LOW (ref 39.0–52.0)
HEMOGLOBIN: 8.8 g/dL — AB (ref 13.0–17.0)
MCH: 22.7 pg — AB (ref 26.0–34.0)
MCHC: 31.7 g/dL (ref 30.0–36.0)
MCV: 71.8 fL — AB (ref 78.0–100.0)
Platelets: 234 10*3/uL (ref 150–400)
RBC: 3.87 MIL/uL — AB (ref 4.22–5.81)
RDW: 16.4 % — ABNORMAL HIGH (ref 11.5–15.5)
WBC: 7.6 10*3/uL (ref 4.0–10.5)

## 2015-08-08 MED ORDER — FUROSEMIDE 10 MG/ML IJ SOLN
20.0000 mg | Freq: Once | INTRAMUSCULAR | Status: AC
  Administered 2015-08-08: 20 mg via INTRAVENOUS
  Filled 2015-08-08: qty 2

## 2015-08-08 MED ORDER — ALBUTEROL SULFATE (2.5 MG/3ML) 0.083% IN NEBU
2.5000 mg | INHALATION_SOLUTION | RESPIRATORY_TRACT | Status: DC | PRN
  Administered 2015-08-08 (×2): 2.5 mg via RESPIRATORY_TRACT
  Filled 2015-08-08 (×2): qty 3

## 2015-08-08 NOTE — Evaluation (Signed)
Physical Therapy Evaluation Patient Details Name: Onie Hayashi MRN: [id number] DOB: April 19, 1923 Today's Date: 08/08/2015   History of Present Illness  82 yo male admitted with sepsis, +flu, L shoulder fx (conservative management per ortho). Hx of dementia, CVA.   Clinical Impression  On eval pt required Total assist +2 for bed mobility-rolled to R side. Total assist for hygiene due to bowel incontinence. Pt did not follow commands well. He kept his eyes closed for most of session. Even attempts at ranging LEs were met with resistance. At this time, recommend ST rehab at [facility].     Follow Up Recommendations SNF;Supervision/Assistance - 24 hour (unless family feels they can manage with pt at home)    Equipment Recommendations   (to be determined)    Recommendations for Other Services       Precautions / Restrictions Precautions Precautions: Fall Restrictions Weight Bearing Restrictions: No      Mobility  Bed Mobility Overal bed mobility: Needs Assistance Bed Mobility: Rolling Rolling: Total assist;+2 for physical assistance         General bed mobility comments: Roll to R side. Total assist for hygiene. Unable to attempt supine>sit due to pt not following commands, eyes closed despite verbal and tactile stimuli.   Transfers                    Ambulation/Gait                Stairs            Wheelchair Mobility    Modified Rankin (Stroke Patients Only)       Balance                                             Pertinent Vitals/Pain Pain Assessment: Faces Faces Pain Scale: No hurt    Home Living Family/patient expects to be discharged to:: Private residence Living Arrangements: Spouse/significant other;Children Available Help at Discharge: Family Type of Home: House Home Access: Stairs to enter     Home Layout: Two level Home Equipment: None      Prior Function Level of Independence: Needs assistance   Gait /  Transfers Assistance Needed: ambulatory without device at baseline     Comments: pt attends [facility] 5x/week during the day     Hand Dominance        Extremity/Trunk Assessment   Upper Extremity Assessment: LUE deficits/detail;Difficult to assess due to impaired cognition       LUE Deficits / Details: in sling   Lower Extremity Assessment: Difficult to assess due to impaired cognition         Communication   Communication: No difficulties  Cognition Arousal/Alertness:  (sleepy? pt kept eyes closed. very little verbal responses)   Overall Cognitive Status: History of cognitive impairments - at baseline Area of Impairment: Following commands       Following Commands: Follows one step commands inconsistently (did not follow commands for most of session)            General Comments      Exercises        Assessment/Plan    PT Assessment Patient needs continued PT services  PT Diagnosis Difficulty walking;Generalized weakness;Altered mental status   PT Problem List Decreased strength;Decreased activity tolerance;Decreased mobility;Decreased balance;Decreased cognition;Decreased knowledge of use of DME;Decreased safety awareness  PT Treatment Interventions Gait training;Functional  mobility training;Therapeutic activities;Patient/family education;Balance training;Therapeutic exercise;DME instruction   PT Goals (Current goals can be found in the Care Plan section) Acute Rehab PT Goals Patient Stated Goal: to increase mobility per daughter PT Goal Formulation: With family Time For Goal Achievement: 08/22/15 Potential to Achieve Goals: Fair    Frequency Min 3X/week   Barriers to discharge        Co-evaluation               End of Session   Activity Tolerance: Patient tolerated treatment well Patient left: in bed;with call bell/phone within reach;with bed alarm set;with family/visitor present           Time: 2263-3354 PT Time Calculation (min)  (ACUTE ONLY): 21 min   Charges:   PT Evaluation $PT Eval Moderate Complexity: 1 Procedure     PT G Codes:        Weston Anna, MPT Pager: [phone]

## 2015-08-08 NOTE — Progress Notes (Signed)
Pt became very wheezy and SOB. MD made aware. IVF stopped, Lasix and breathing treatment given. Callie Fielding RN

## 2015-08-08 NOTE — Progress Notes (Signed)
[facility] PROGRESS NOTE  Hamze Aubrey [id number] DOB: [birth year] DOA: 08/06/2015 PCP: Verline Lema, MD  Assessment/Plan: 1. Sepsis -Present on admission, evidenced by a white count of 19,500, lactate of 2.65, acute kidney injury, heart rates in the 120s with source of infection likely to be aspiration pneumonia/Influenza -Chest x-ray showing presence of right lower lobe infiltrate. Flu swab positive for influenza A -Blood culture showing no growth to date  -Overnight lactic acid normalizing to 1.5 from 2.65 on presentation. -Repeat lab work showing a downward trend in his white count from 19,500 to 7,600  -Continue supportive care, IV fluids, IV antimicrobial therapy. -He continues to spike temps.   2.  Suspected aspiration pneumonia -Family members reporting vomiting on Orange juice last Wednesday. He has a history of advanced dementia. -Chest x-ray showing presence of right lower lobe infiltrate which could argue for aspiration pneumonia, as he presented septic. -He was started on IV Zosyn  -Repeat CXR on 08/07/2015 showing persistent right lower lobe PNA -He was evaluated by SLP on 08/07/2015, having dysphagia secondary to dementia  3.  Influenza A -Patient exposed to family member who had influenza. -Family members reporting functional decline and flulike symptoms  -FLu swab positive -Started Tamiflu 30 mg PO BID  4.  Functional decline/failure to thrive -This was likely precipitated by multiple factors including infection, acute kidney injury, he hydration -Continue IV fluids, IV antimicrobials, physical therapy consult. At baseline he resides in the community, has advanced dementia, has been participating in adult daycare, requires assistance with most activities of daily living.  5.  Acute kidney injury. -Initial lab work showing crit and 1.33 with BUN of 30. Likely secondary to critical illness/hypovolemia -Repeat labs showing improved Cr of 1.02  6.  Left  humeral neck fracture -History of having a fall last Wednesday bathroom which likely causes fracture. -ED MD spoke with orthopedics recommended nonoperative management.  7.  Dysphagia -In setting of advanced dementia.  -SLP consulted.  -Getting treated for probable aspiration PNA -On Dysphagia 1 diet  Code Status: Full code Family Communication: I spoke to family members present at bedside Disposition Plan: Continue IV antibiotics, IV fluids, supportive care   Consultants:  Physical therapy  Antibiotics:  IV Zosyn  HPI/Subjective: Mr. Amundsen is a pleasant 82 year old gentleman with a past medical history of dementia, residing in the community, admitted to medicine service on 08/06/2015 when he presented with a functional decline. Family members reported him to fall last Wednesday in his bathroom. Since then they have reported him to be less interactive having minimal by mouth intake, excessive daytime sleepiness. We had also reported concerns over his exposure to a family member who had the flu. Initial workup included a chest x-ray that showed right lower lobe infiltrate consistent with pneumonia. Labs showed an elevated white count of 19,509 lactic acid 2.65. Patient having sepsis criteria. Placed on sepsis protocol, administered IV fluid resuscitation along with initiation of IV antibiotic therapy. Flu swab came back positive for influenza A.   Objective: Filed Vitals:   08/07/15 2156 08/08/15 0642  BP: 95/69 132/60  Pulse: 94 94  Temp: 98 F (36.7 C) 98.3 F (36.8 C)  Resp: 16 16    Intake/Output Summary (Last 24 hours) at 08/08/15 0711 Last data filed at 08/08/15 0643  Gross per 24 hour  Intake 1651.25 ml  Output    450 ml  Net 1201.25 ml   Filed Weights   08/06/15 1857  Weight: 72.98 kg (160 lb 14.3 oz)  Exam:   General:  He appears more awake, is more interactive and responding to my questions  Cardiovascular: Regular rate rhythm normal [id number] 26 systolic  ejection murmur  Respiratory: Normal respiratory effort, having coarse respiratory sounds, right basilar crackles and rhonchi  Abdomen: Soft nontender nondistended  Musculoskeletal: No edema  Data Reviewed: Basic Metabolic Panel:  Recent Labs Lab 08/06/15 1502 08/07/15 0605  NA 135 138  K 4.3 4.0  CL 99* 105  CO2 25 24  GLUCOSE 152* 92  BUN 30* 25*  CREATININE 1.33* 1.02  CALCIUM 8.4* 8.0*   Liver Function Tests:  Recent Labs Lab 08/06/15 1502  AST 40  ALT 20  ALKPHOS 66  BILITOT 1.1  PROT 6.9  ALBUMIN 3.5   No results for input(s): LIPASE, AMYLASE in the last 168 hours. No results for input(s): AMMONIA in the last 168 hours. CBC:  Recent Labs Lab 08/06/15 1512 08/07/15 0605 08/08/15 0606  WBC 19.5* 12.9* 7.6  NEUTROABS 16.6*  --   --   HGB 11.3* 10.0* 8.8*  HCT 35.3* 30.5* 27.8*  MCV 71.3* 69.5* 71.8*  PLT 193 151 234   Cardiac Enzymes: No results for input(s): CKTOTAL, CKMB, CKMBINDEX, TROPONINI in the last 168 hours. BNP (last 3 results)  Recent Labs  11/06/14 2205  BNP 671.5*    ProBNP (last 3 results) No results for input(s): PROBNP in the last 8760 hours.  CBG: No results for input(s): GLUCAP in the last 168 hours.  Recent Results (from the past 240 hour(s))  Culture, blood (routine x 2)     Status: None (Preliminary result)   Collection Time: 08/06/15  3:02 PM  Result Value Ref Range Status   Specimen Description BLOOD RIGHT ANTECUBITAL  Final   Special Requests BOTTLES DRAWN AEROBIC AND ANAEROBIC 5CC  Final   Culture   Final    NO GROWTH < 24 HOURS Performed at [facility]    Report Status PENDING  Incomplete  Culture, blood (routine x 2)     Status: None (Preliminary result)   Collection Time: 08/06/15  3:10 PM  Result Value Ref Range Status   Specimen Description BLOOD RIGHT ARM  Final   Special Requests BOTTLES DRAWN AEROBIC AND ANAEROBIC 5CC  Final   Culture   Final    NO GROWTH < 24 HOURS Performed at [facility]    Report Status PENDING  Incomplete  Urine culture     Status: None (Preliminary result)   Collection Time: 08/06/15  3:33 PM  Result Value Ref Range Status   Specimen Description URINE, CATHETERIZED  Final   Special Requests NONE  Final   Culture   Final    NO GROWTH < 24 HOURS Performed at [facility]    Report Status PENDING  Incomplete     Studies: Dg Chest 2 View  08/07/2015  CLINICAL DATA:  Cough and congestion. EXAM: CHEST - 2 VIEW COMPARISON:  Two-view chest x-ray 08/06/2015. FINDINGS: The heart size is exaggerated by low lung volumes. A right basilar pneumonia is again noted. The proximal left humerus fracture is again seen. IMPRESSION: 1. Persistent right basilar pneumonia. 2. Proximal left humerus fracture. Electronically Signed   By: San Morelle M.D.   On: 08/07/2015 08:21   Dg Chest 2 View  08/06/2015  CLINICAL DATA:  Fever.  Recent fall EXAM: CHEST  2 VIEW COMPARISON:  11/06/2014 FINDINGS: Right lower lobe infiltrate is new and may represent pneumonia. Small right effusion Left  lung is clear.  Negative for heart failure Fracture left humeral neck which appears acute. IMPRESSION: Right lower lobe infiltrate consistent with pneumonia Fracture left humeral neck Electronically Signed   By: Franchot Gallo M.D.   On: 08/06/2015 14:29   Ct Head Wo Contrast  08/06/2015  CLINICAL DATA:  [place] and hit head EXAM: CT HEAD WITHOUT CONTRAST TECHNIQUE: Contiguous axial images were obtained from the base of the skull through the vertex without intravenous contrast. COMPARISON:  CT head 10/18/2012 FINDINGS: Advanced atrophy. Chronic microvascular ischemic changes in the white matter. Negative for acute infarct. Negative for acute hemorrhage or mass lesion. Atherosclerotic calcifications circle of Willis. No acute skull abnormality. Negative for fracture. IMPRESSION: Atrophy and chronic ischemia.  No acute abnormality. Electronically Signed   By: Franchot Gallo M.D.    On: 08/06/2015 15:03   Dg Humerus Left  08/06/2015  CLINICAL DATA:  Fall on left on Wednesday EXAM: LEFT HUMERUS - 2+ VIEW COMPARISON:  None. FINDINGS: Four views of the left humerus submitted. There is minimal impacted comminuted fracture of the left humeral neck extending in humeral head. IMPRESSION: There is minimal impacted comminuted fracture of the left humeral neck extending in humeral head. Electronically Signed   By: Lahoma Crocker M.D.   On: 08/06/2015 14:28    Scheduled Meds: . enoxaparin (LOVENOX) injection  40 mg Subcutaneous Q24H  . oseltamivir  30 mg Oral BID  . piperacillin-tazobactam (ZOSYN)  IV  3.375 g Intravenous Q8H  . sodium chloride flush  3 mL Intravenous Q12H  . tamsulosin  0.4 mg Oral Daily   Continuous Infusions: . sodium chloride 1,000 mL (08/08/15 0124)    Principal Problem:   Sepsis ([place]) Active Problems:   Pneumonia   Metabolic encephalopathy   Dementia   AKI (acute kidney injury) ([place])   Left humeral fracture    Time spent: 35 min    Kelvin Cellar  [facility] Pager [phone] 7PM-7AM, please contact night-coverage at [email or web address], password [facility] 08/08/2015, 7:11 AM  LOS: 2 days

## 2015-08-09 DIAGNOSIS — A413 Sepsis due to Hemophilus influenzae: Secondary | ICD-10-CM

## 2015-08-09 DIAGNOSIS — N179 Acute kidney failure, unspecified: Secondary | ICD-10-CM

## 2015-08-09 DIAGNOSIS — J09X1 Influenza due to identified novel influenza A virus with pneumonia: Secondary | ICD-10-CM

## 2015-08-09 DIAGNOSIS — S42302S Unspecified fracture of shaft of humerus, left arm, sequela: Secondary | ICD-10-CM

## 2015-08-09 LAB — CBC
HCT: 26.5 % — ABNORMAL LOW (ref 39.0–52.0)
HEMOGLOBIN: 8.5 g/dL — AB (ref 13.0–17.0)
MCH: 22.7 pg — ABNORMAL LOW (ref 26.0–34.0)
MCHC: 32.1 g/dL (ref 30.0–36.0)
MCV: 70.9 fL — ABNORMAL LOW (ref 78.0–100.0)
Platelets: 267 10*3/uL (ref 150–400)
RBC: 3.74 MIL/uL — AB (ref 4.22–5.81)
RDW: 16.4 % — ABNORMAL HIGH (ref 11.5–15.5)
WBC: 8.6 10*3/uL (ref 4.0–10.5)

## 2015-08-09 LAB — BASIC METABOLIC PANEL
ANION GAP: 8 (ref 5–15)
BUN: 23 mg/dL — ABNORMAL HIGH (ref 6–20)
CALCIUM: 7.9 mg/dL — AB (ref 8.9–10.3)
CO2: 25 mmol/L (ref 22–32)
Chloride: 107 mmol/L (ref 101–111)
Creatinine, Ser: 1.22 mg/dL (ref 0.61–1.24)
GFR, EST AFRICAN AMERICAN: 58 mL/min — AB (ref >60)
GFR, EST NON AFRICAN AMERICAN: 50 mL/min — AB (ref >60)
Glucose, Bld: 107 mg/dL — ABNORMAL HIGH (ref 65–99)
Potassium: 3.5 mmol/L (ref 3.5–5.1)
Sodium: 140 mmol/L (ref 135–145)

## 2015-08-09 NOTE — Care Management Important Message (Signed)
Important Message  Patient Details IM Letter given to Kathy/Case Manager to present to Patient Name: Taelon Delrosario MRN: [id number] Date of Birth: 1922/06/06   Medicare Important Message Given:  Yes    Camillo Flaming 08/09/2015, 10:26 AMImportant Message  Patient Details  Name: Ameen Chico MRN: [id number] Date of Birth: May 31, 1922   Medicare Important Message Given:  Yes    Camillo Flaming 08/09/2015, 10:26 AM

## 2015-08-09 NOTE — Progress Notes (Signed)
Speech Language Pathology Treatment: Dysphagia  Patient Details Name: Douglas Mckee MRN: [id number] DOB: 07/26/1922 Today's Date: 08/09/2015 Time: 1040-1100 SLP Time Calculation (min) (ACUTE ONLY): 20 min  Assessment / Plan / Recommendation Clinical Impression  Pt seen with two daughters, one about to feed pt breakfast. Initially family slightly resistant to education and recommendations, but with demonstration daughter more verbally willing to follow precautions. Provided cues to family for upright posture, instruction to be aware of swallow trigger and how to provide verbal cues to aid pt in initiating swallow. Demonstrated thickening with water as pt continues to have significantly delayed swallow with coughing following thin liquids. Daughters feel pt is back to cognitive baseline, so likely pt would benefit from thickened liquids into the long term. Discussed risk of aspiration and nature of dysphagia with dementia with waxing and waning function. Family verbalized understanding but would like benefit from f/u education prior to d/c if possible. There are 7 daughters, I have met with 4 of them.    HPI HPI: Douglas Mckee is a 82 y.o. male with a past medical history of dementia, at baseline he is ambulatory, resides in the community, attends the [facility] program, able to feed himself, requires assistance with pain being and dressing, brought to the emergency department for functional decline. His family members reporting that he had a fall last Wednesday. That same day he vomited his orange juice. Since then family members reporting a steep functional decline as he has become less interactive, having minimal by mouth intake, excessive daytime sleepiness, having generalized weakness. Chest x-ray showing presence of right lower lobe infiltrate. Flu swab positive for influenza A. Pt suspected to have sepsis from source of infection likely to be aspiration pneumonia/Influenza.       SLP Plan  Continue with  current plan of care     Recommendations  Diet recommendations: Dysphagia 1 (puree);Nectar-thick liquid Liquids provided via: Cup;Straw Medication Administration: Crushed with puree Supervision: Full supervision/cueing for compensatory strategies;Trained caregiver to feed patient Compensations: Slow rate;Small sips/bites Postural Changes and/or Swallow Maneuvers: Seated upright 90 degrees             Oral Care Recommendations: Oral care BID Follow up Recommendations: 24 hour supervision/assistance Plan: Continue with current plan of care     [place] Lorijean Husser, MA CCC-SLP [phone]  Lynann Beaver 08/09/2015, 11:06 AM

## 2015-08-09 NOTE — NC FL2 (Signed)
[place] LEVEL OF CARE SCREENING TOOL     IDENTIFICATION  Patient Name: Mohamedali Doty Birthdate: [birth year] Sex: male Admission Date (Current Location): 08/06/2015  [facility] and [place] Number:  Herbalist and Address:  [facility],  [place] [address] [place]      Provider Number: [id number]  Attending Physician Name and Address:  Robbie Lis, MD  Relative Name and Phone Number:       Current Level of Care: Hospital Recommended Level of Care: [place] Prior Approval Number:    Date Approved/Denied:   PASRR Number: [id number]  Discharge Plan: SNF    Current Diagnoses: Patient Active Problem List   Diagnosis Date Noted  . Pneumonia 08/06/2015  . Sepsis ([place]) 08/06/2015  . AKI (acute kidney injury) ([place]) 08/06/2015  . Left humeral fracture 08/06/2015  . Dementia 10/21/2012  . PSA elevation 10/20/2012  . Hypertensive urgency, malignant 10/18/2012  . UTI (lower urinary tract infection) 10/18/2012  . Metabolic encephalopathy 99991111  . TIA (transient ischemic attack) 10/18/2012  . Microcytic anemia 10/18/2012  . BPH (benign prostatic hyperplasia) 10/18/2012  . CVA, old, cognitive deficits 10/18/2012  . Alzheimer's dementia 10/18/2012    Orientation RESPIRATION BLADDER Height & Weight     Self  Normal Incontinent, External catheter Weight: 160 lb 14.3 oz (72.98 kg) Height:  5\' 11"  (180.3 cm)  BEHAVIORAL SYMPTOMS/MOOD NEUROLOGICAL BOWEL NUTRITION STATUS      Incontinent Diet (Dysphasia 1 Diet)  AMBULATORY STATUS COMMUNICATION OF NEEDS Skin   Extensive Assist Verbally Normal                       Personal Care Assistance Level of Assistance  Bathing, Feeding, Dressing Bathing Assistance: Limited assistance Feeding assistance: Limited assistance Dressing Assistance: Limited assistance     Functional Limitations Info             SPECIAL CARE FACTORS FREQUENCY  PT (By licensed PT), OT  (By licensed OT)     PT Frequency: 5 OT Frequency: 5            Contractures      Additional Factors Info  Code Status, Allergies, Isolation Precautions Code Status Info: Fullcode Allergies Info: NKDA     Isolation Precautions Info: Droplet - tested positive for Influenza A , started on Tamiflu 3/11 (last dose = 3/16)     Current Medications (08/09/2015):  This is the current hospital active medication list Current Facility-Administered Medications  Medication Dose Route Frequency Provider Last Rate Last Dose  . acetaminophen (TYLENOL) tablet 650 mg  650 mg Oral Q6H PRN Kelvin Cellar, MD   650 mg at 08/07/15 1409   Or  . acetaminophen (TYLENOL) suppository 650 mg  650 mg Rectal Q6H PRN Kelvin Cellar, MD      . albuterol (PROVENTIL) (2.5 MG/3ML) 0.083% nebulizer solution 2.5 mg  2.5 mg Nebulization Q4H PRN Kelvin Cellar, MD   2.5 mg at 08/08/15 2159  . enoxaparin (LOVENOX) injection 40 mg  40 mg Subcutaneous Q24H Kelvin Cellar, MD   40 mg at 08/08/15 2122  . morphine 2 MG/ML injection 1 mg  1 mg Intravenous Q4H PRN Kelvin Cellar, MD      . ondansetron (ZOFRAN) tablet 4 mg  4 mg Oral Q6H PRN Kelvin Cellar, MD       Or  . ondansetron (ZOFRAN) injection 4 mg  4 mg Intravenous Q6H PRN Kelvin Cellar, MD      .  oseltamivir (TAMIFLU) 6 MG/ML suspension 30 mg  30 mg Oral BID Kelvin Cellar, MD   30 mg at 08/09/15 1012  . oxyCODONE (Oxy IR/ROXICODONE) immediate release tablet 5 mg  5 mg Oral Q4H PRN Kelvin Cellar, MD      . piperacillin-tazobactam (ZOSYN) IVPB 3.375 g  3.375 g Intravenous Q8H Emiliano Dyer, RPH   3.375 g at 08/09/15 1012  . [place]   Oral PRN Kelvin Cellar, MD      . sodium chloride flush (NS) 0.9 % injection 3 mL  3 mL Intravenous Q12H Kelvin Cellar, MD   3 mL at 08/06/15 2021  . tamsulosin (FLOMAX) capsule 0.4 mg  0.4 mg Oral Daily Kelvin Cellar, MD   0.4 mg at 08/09/15 1012     Discharge Medications: Please see discharge  summary for a list of discharge medications.  Relevant Imaging Results:  Relevant Lab Results:   Additional Information SSN: [id number]  Standley Brooking, LCSW

## 2015-08-09 NOTE — Care Management Note (Signed)
Case Management Note  Patient Details  Name: Douglas Mckee MRN: [id number] Date of Birth: 02-May-1923  Subjective/Objective:   Noted declined SNF. Will discuss/offer [place] agency list w/family.                 Action/Plan:d/c plan home w/HHC.   Expected Discharge Date:   (UNKNOWN)               Expected Discharge Plan:  [place]  In-House Referral:     Discharge planning Services  CM Consult  Post Acute Care Choice:    Choice offered to:  Adult Children  DME Arranged:    DME Agency:     HH Arranged:    HH Agency:     Status of Service:  In process, will continue to follow  Medicare Important Message Given:  Yes Date Medicare IM Given:    Medicare IM give by:    Date Additional Medicare IM Given:    Additional Medicare Important Message give by:     If discussed at [place] of Stay Meetings, dates discussed:    Additional Comments:  Dessa Phi, RN 08/09/2015, 12:43 PM

## 2015-08-09 NOTE — Progress Notes (Signed)
Pharmacy Antibiotic Note  Douglas Mckee is a 82 y.o. male admitted on 08/06/2015 with sepsis, RLL pnemonia with possible aspiration. Pharmacy has been consulted for Zosyn dosing.  Plan: Zosyn 3.375g IV q8h (4 hour infusion). Day 4 Labs stable now and afebrile Recommend deescalate abx's to Augmentin 875mg  PO BID for remainder of treatment  Will sign off   Height: 5\' 11"  (180.3 cm) Weight: 160 lb 14.3 oz (72.98 kg) IBW/kg (Calculated) : 75.3  Temp (24hrs), Avg:99.2 F (37.3 C), Min:97.7 F (36.5 C), Max:100 F (37.8 C)   Recent Labs Lab 08/06/15 1502 08/06/15 1512 08/06/15 1520 08/06/15 1955 08/06/15 2243 08/07/15 0605 08/08/15 0606 08/09/15 0509  WBC  --  19.5*  --   --   --  12.9* 7.6 8.6  CREATININE 1.33*  --   --   --   --  1.02 1.13 1.22  LATICACIDVEN  --   --  2.65* 2.5* 1.5  --   --   --     Estimated Creatinine Clearance: 41.6 mL/min (by C-G formula based on Cr of 1.22).   CrCl 37 ml/min/1.47m2 (normalized)  No Known Allergies  Antimicrobials this admission: 3/10 >> azithromycin x 1 3/10 >> zosyn >>  Dose adjustments this admission: ---  Microbiology results: 3/10 BCx: ngtd 3/10 UCx: ngf 3/10 flu: positive influenza A - on Tamiflu   Thank you for allowing pharmacy to be a part of this patient's care.  Adrian Saran, PharmD, BCPS Pager [phone] 08/09/2015 9:06 AM

## 2015-08-09 NOTE — Progress Notes (Signed)
Patient ID: Douglas Mckee, male   DOB: 10/08/1922, 82 y.o.   MRN: [id number] [facility] PROGRESS NOTE  Douglas Mckee MRN:[id number] DOB: 12/14/1922 DOA: 08/06/2015 PCP: PERRY, ANGELA L, MD  Brief narrative:    82-year-old gentleman with a past medical history of dementia, residing in the community, admitted to [facility] 08/06/2015 with progressive functional decline. He also had a fall about a week prior to the admission. Per family report patient has been less interactive and has minimal by mouth intake, excessive daytime sleepiness. The family was also concerned about exposure to family members who had flu.  On this admission, patient was found to have right lower lobe infiltrate consistent with pneumonia. He is white blood cell count was 19, lactic acid 2.65. He is influenza panel was positive for influenza a for which reason he is on Tamiflu.   Assessment/Plan:    Principal problem: Sepsis secondary to influenza a pneumonia / aspiration pneumonitis - Sepsis criteria met on the admission with elevated white blood cell count, lactic acidosis, tachycardia and evidence of pneumonia on chest x-ray. Further we do note that patient has influenza a pneumonia based on influenza panel. - Also because of the location of pneumonia at the right lung base there is a concern for aspiration especially knowing that patient has history of dementia and poor by mouth intake and progressive functional decline - Sepsis etiology is improving. White blood cell count is within normal limits, lactic acid has by now resolved to within normal limits. Respiratory status is stable. - Patient is currently on Tamiflu and Zosyn  Active problems: Aspiration risk - Secondary to progressive dementia - Patient was seen by SLP on 08/07/2015, has dysphagia  - Per SLP, dysphagia 1 diet   Functional decline / failure to thrive - Likely multifactorial secondary to progressive dementia in combination with acute  sepsis from influenza - Appreciate physical therapy evaluation  Acute kidney injury - Secondary to sepsis - Creatinine improved to within normal limits with IV fluids  Left humeral neck fracture - S/P fall - Per ortho, non operative management    DVT Prophylaxis  - Lovenox subcutaneous ordered  Code Status: Full.  Family Communication:  Family not at the bedside Disposition Plan: Likely to skilled nursing facility by 08/11/2015   IV access:  Peripheral IV  Procedures and diagnostic studies:    Dg Chest 2 View 08/07/2015  1. Persistent right basilar pneumonia. 2. Proximal left humerus fracture.   Dg Chest 2 View 08/06/2015  Right lower lobe infiltrate consistent with pneumonia Fracture left humeral neck   Ct Head Wo Contrast 08/06/2015 Atrophy and chronic ischemia.  No acute abnormality.   Dg Humerus Left 08/06/2015 There is minimal impacted comminuted fracture of the left humeral neck extending in humeral head.   Medical Consultants:  None   Other Consultants:  PT SLP  IAnti-Infectives:   Zosyn Tamiflu   , , MD  [facility] Pager [phone]  Time spent in minutes: 25 minutes  If 7PM-7AM, please contact night-coverage [email or web address] Password [email or web address] 08/09/2015, 10:26 AM   LOS: 3 days    HPI/Subjective: No acute overnight events. No respiratory distress.   Objective: Filed Vitals:   08/08/15 1759 08/08/15 2120 08/08/15 2159 08/09/15 0515  BP:  138/57  151/69  Pulse:  100  95  Temp:  100 F (37.8 C)  97.7 F (36.5 C)  TempSrc:  Oral  Axillary  Resp:  20  18  Height:      Weight:        SpO2: 100% 95% 96% 100%    Intake/Output Summary (Last 24 hours) at 08/09/15 1026 Last data filed at 08/09/15 0700  Gross per 24 hour  Intake   1275 ml  Output   2225 ml  Net   -950 ml    Exam:   General:  Pt is not in acute distress  Cardiovascular: Regular rate and rhythm, S1/S2 appreciated   Respiratory: Clear to auscultation bilaterally, no  wheezing, no crackles, no rhonchi  Abdomen: Soft, non tender, non distended, bowel sounds present  Extremities: No edema, pulses palpable bilaterally  Neuro: Grossly nonfocal  Data Reviewed: Basic Metabolic Panel:  Recent Labs Lab 08/06/15 1502 08/07/15 0605 08/08/15 0606 08/09/15 0509  NA 135 138 143 140  K 4.3 4.0 3.8 3.5  CL 99* 105 110 107  CO2 _0 GLUCOSE 152* 92 98 107*  BUN 30* 25* 31* 23*  CREATININE 1.33* 1.02 1.13 1.22  CALCIUM 8.4* 8.0* 7.9* 7.9*   Liver Function Tests:  Recent Labs Lab 08/06/15 1502  AST 40  ALT 20  ALKPHOS 66  BILITOT 1.1  PROT 6.9  ALBUMIN 3.5   No results for input(s): LIPASE, AMYLASE in the last 168 hours. No results for input(s): AMMONIA in the last 168 hours. CBC:  Recent Labs Lab 08/06/15 1512 08/07/15 0605 08/08/15 0606 08/09/15 0509  WBC 19.5* 12.9* 7.6 8.6  NEUTROABS 16.6*  --   --   --   HGB 11.3* 10.0* 8.8* 8.5*  HCT 35.3* 30.5* 27.8* 26.5*  MCV 71.3* 69.5* 71.8* 70.9*  PLT 193 151 234 267   Cardiac Enzymes: No results for input(s): CKTOTAL, CKMB, CKMBINDEX, TROPONINI in the last 168 hours. BNP: Invalid input(s): POCBNP CBG: No results for input(s): GLUCAP in the last 168 hours.  Recent Results (from the past 240 hour(s))  Culture, blood (routine x 2)     Status: None (Preliminary result)   Collection Time: 08/06/15  3:02 PM  Result Value Ref Range Status   Specimen Description BLOOD RIGHT ANTECUBITAL  Final   Special Requests BOTTLES DRAWN AEROBIC AND ANAEROBIC 5CC  Final   Culture   Final    NO GROWTH 2 DAYS Performed at [facility]    Report Status PENDING  Incomplete  Culture, blood (routine x 2)     Status: None (Preliminary result)   Collection Time: 08/06/15  3:10 PM  Result Value Ref Range Status   Specimen Description BLOOD RIGHT ARM  Final   Special Requests BOTTLES DRAWN AEROBIC AND ANAEROBIC 5CC  Final   Culture   Final    NO GROWTH 2 DAYS Performed at [facility]    Report Status PENDING  Incomplete  Urine culture     Status: None   Collection Time: 08/06/15  3:33 PM  Result Value Ref Range Status   Specimen Description URINE, CATHETERIZED  Final   Special Requests NONE  Final   Culture   Final    NO GROWTH 2 DAYS Performed at [facility]    Report Status 08/08/2015 FINAL  Final     Scheduled Meds: . enoxaparin (LOVENOX) injection  40 mg Subcutaneous Q24H  . oseltamivir  30 mg Oral BID  . piperacillin-tazobactam (ZOSYN)  IV  3.375 g Intravenous Q8H  . sodium chloride flush  3 mL Intravenous Q12H  . tamsulosin  0.4 mg Oral Daily   Continuous Infusions:

## 2015-08-09 NOTE — Progress Notes (Signed)
CSW reviewed PT evaluation recommending SNF; Supervision/assistance 24 hour. CSW spoke with patient's granddaughter, Alvie Heidelberg at bedside who confirmed that patient will have plenty of help at home - lives with daughters - Sharlet [place] & [place]. RNCM, Juliann Pulse made aware of family's decision to decline SNF.   No further CSW needs identified - CSW signing off.   Raynaldo Opitz, [facility] Clinical Social Worker cell #: [phone]

## 2015-08-09 NOTE — Care Management Note (Signed)
Case Management Note  Patient Details  Name: Douglas Mckee MRN: [id number] Date of Birth: 09-Nov-1922  Subjective/Objective:  Went into rm to discuss [place] for d/c plans w/family-Spoke to August in rm-explained HHC(intermittent services,pvt care duty services(out of pocket expense)-they are unable to pay for pvt duty care, & understand that if more work is on them they choose SNF, also explained that when MD says d/c, they want to have the d/c plans taken care of before d/c since insurance company will not pay for services after d/c, & when patient is medically stable for d/c-they voiced understanding. CSW notified of family wanting to explore SNF.                  Action/Plan:dj/c plan SNF.   Expected Discharge Date:   (UNKNOWN)               Expected Discharge Plan:  Skilled Nursing Facility  In-House Referral:     Discharge planning Services  CM Consult  Post Acute Care Choice:    Choice offered to:  Adult Children  DME Arranged:    DME Agency:     HH Arranged:    HH Agency:     Status of Service:  In process, will continue to follow  Medicare Important Message Given:  Yes Date Medicare IM Given:    Medicare IM give by:    Date Additional Medicare IM Given:    Additional Medicare Important Message give by:     If discussed at [place] of Stay Meetings, dates discussed:    Additional Comments:  Dessa Phi, RN 08/09/2015, 1:34 PM

## 2015-08-10 DIAGNOSIS — J101 Influenza due to other identified influenza virus with other respiratory manifestations: Secondary | ICD-10-CM

## 2015-08-10 DIAGNOSIS — J690 Pneumonitis due to inhalation of food and vomit: Secondary | ICD-10-CM

## 2015-08-10 NOTE — Progress Notes (Addendum)
Patient ID: Douglas Mckee, male   DOB: Feb 26, 1923, 82 y.o.   MRN: [id number] [facility] PROGRESS NOTE  Constantine Ruddick [id number] DOB: 1922-07-03 DOA: 08/06/2015 PCP: Verline Lema, MD  Brief narrative:    82 year old gentleman with a past medical history of dementia, residing in the community, admitted to [facility] 08/06/2015 with progressive functional decline. He also had a fall about a week prior to the admission. Per family report patient has been less interactive and has minimal by mouth intake, excessive daytime sleepiness. The family was also concerned about exposure to family members who had flu.  On this admission, patient was found to have right lower lobe infiltrate consistent with pneumonia. He is white blood cell count was 19, lactic acid 2.65. He is influenza panel was positive for influenza a for which reason he is on Tamiflu.  Assessment/Plan:    Principal problem: Sepsis secondary to influenza a pneumonia / aspiration pneumonitis - Sepsis criteria met on the admission with elevated white blood cell count, lactic acidosis, tachycardia and evidence of pneumonia on chest x-ray. Source of infection is influenza A virus. Continue Tamiflu per pharmacy dosing. - Patient also suspected to have aspiration pneumonia considering that he is pneumonia is in right lung base. Zosyn should cover for aspiration pneumonitis - Check CBC tomorrow morning. - Patient is hemodynamically stable and respiratory status is stable at this time.   Active problems: Aspiration risk - Secondary to progressive dementia - Per SLP evaluation on 08/07/2015, dysphagia 1 diet   Functional decline / failure to thrive - Likely secondary to progressive dementia - Per physical therapy evaluation recommendation is for skilled nursing facility placement - Dysphagia 1 diet as mentioned above  Acute kidney injury - Secondary to sepsis - Creatinine normalized with IV fluids - Check BMP tomorrow  morning  Left humeral neck fracture - S/P fall - Per ortho, non operative management   Microcytic anemia - Hemoglobin is stable at 8.5 - No current indications for transfusion   DVT Prophylaxis  - Lovenox subcutaneous ordered  Code Status: Full.  Family Communication:  Family not at the bedside,  Disposition Plan: Likely to skilled nursing facility by 08/12/2015   IV access:  Peripheral IV  Procedures and diagnostic studies:    Dg Chest 2 View 08/07/2015  1. Persistent right basilar pneumonia. 2. Proximal left humerus fracture.   Dg Chest 2 View 08/06/2015  Right lower lobe infiltrate consistent with pneumonia Fracture left humeral neck   Ct Head Wo Contrast 08/06/2015 Atrophy and chronic ischemia.  No acute abnormality.   Dg Humerus Left 08/06/2015 There is minimal impacted comminuted fracture of the left humeral neck extending in humeral head.   Medical Consultants:  None   Other Consultants:  PT SLP  IAnti-Infectives:   Zosyn Tamiflu   Leisa Lenz, MD  [facility] Pager [phone]  Time spent in minutes: 25 minutes  If 7PM-7AM, please contact night-coverage [email or web address] Password [email or web address] 08/10/2015, 8:35 AM   LOS: 4 days    HPI/Subjective: No acute overnight events. No agitation.   Objective: Filed Vitals:   08/09/15 0515 08/09/15 1241 08/09/15 2152 08/10/15 0618  BP: 151/69 127/85 157/73 151/69  Pulse: 95 94 96 88  Temp: 97.7 F (36.5 C) 98.5 F (36.9 C) 98 F (36.7 C) 98.2 F (36.8 C)  TempSrc: Axillary Oral Oral Oral  Resp: '18 18 18 16  ' Height:      Weight:      SpO2: 100% 98% 99% 99%  Intake/Output Summary (Last 24 hours) at 08/10/15 0835 Last data filed at 08/10/15 0700  Gross per 24 hour  Intake    270 ml  Output   2050 ml  Net  -1780 ml    Exam:   General:  Pt is more alert this am, no distress   Cardiovascular: RRR, S1/S2 (+)  Respiratory: No wheezing, no crackles, no rhonchi  Abdomen: (+) BS, non tender    Extremities: No swelling, palpable pulses   Neuro: Nonfocal  Data Reviewed: Basic Metabolic Panel:  Recent Labs Lab 08/06/15 1502 08/07/15 0605 08/08/15 0606 08/09/15 0509  NA 135 138 143 140  K 4.3 4.0 3.8 3.5  CL 99* 105 110 107  CO2 '25 24 23 25  ' GLUCOSE 152* 92 98 107*  BUN 30* 25* 31* 23*  CREATININE 1.33* 1.02 1.13 1.22  CALCIUM 8.4* 8.0* 7.9* 7.9*   Liver Function Tests:  Recent Labs Lab 08/06/15 1502  AST 40  ALT 20  ALKPHOS 66  BILITOT 1.1  PROT 6.9  ALBUMIN 3.5   No results for input(s): LIPASE, AMYLASE in the last 168 hours. No results for input(s): AMMONIA in the last 168 hours. CBC:  Recent Labs Lab 08/06/15 1512 08/07/15 0605 08/08/15 0606 08/09/15 0509  WBC 19.5* 12.9* 7.6 8.6  NEUTROABS 16.6*  --   --   --   HGB 11.3* 10.0* 8.8* 8.5*  HCT 35.3* 30.5* 27.8* 26.5*  MCV 71.3* 69.5* 71.8* 70.9*  PLT 193 151 234 267   Cardiac Enzymes: No results for input(s): CKTOTAL, CKMB, CKMBINDEX, TROPONINI in the last 168 hours. BNP: Invalid input(s): POCBNP CBG: No results for input(s): GLUCAP in the last 168 hours.  Recent Results (from the past 240 hour(s))  Culture, blood (routine x 2)     Status: None (Preliminary result)   Collection Time: 08/06/15  3:02 PM  Result Value Ref Range Status   Specimen Description BLOOD RIGHT ANTECUBITAL  Final   Special Requests BOTTLES DRAWN AEROBIC AND ANAEROBIC 5CC  Final   Culture   Final    NO GROWTH 3 DAYS Performed at [facility]    Report Status PENDING  Incomplete  Culture, blood (routine x 2)     Status: None (Preliminary result)   Collection Time: 08/06/15  3:10 PM  Result Value Ref Range Status   Specimen Description BLOOD RIGHT ARM  Final   Special Requests BOTTLES DRAWN AEROBIC AND ANAEROBIC 5CC  Final   Culture   Final    NO GROWTH 3 DAYS Performed at [facility]    Report Status PENDING  Incomplete  Urine culture     Status: None   Collection Time: 08/06/15  3:33 PM   Result Value Ref Range Status   Specimen Description URINE, CATHETERIZED  Final   Special Requests NONE  Final   Culture   Final    NO GROWTH 2 DAYS Performed at [facility]    Report Status 08/08/2015 FINAL  Final     Scheduled Meds: . enoxaparin (LOVENOX) injection  40 mg Subcutaneous Q24H  . oseltamivir  30 mg Oral BID  . piperacillin-tazobactam (ZOSYN)  IV  3.375 g Intravenous Q8H  . sodium chloride flush  3 mL Intravenous Q12H  . tamsulosin  0.4 mg Oral Daily   Continuous Infusions:

## 2015-08-10 NOTE — Progress Notes (Signed)
Physical Therapy Treatment Patient Details Name: Douglas Mckee MRN: [id number] DOB: Apr 01, 1923 Today's Date: 08/10/2015    History of Present Illness 82 yo male admitted with sepsis, +flu, L shoulder fx (conservative management per ortho). Hx of dementia, CVA.     PT Comments    Pt responds to functional commands.  Daughter/care giver Lorrie in room and also assisted get pt OOB to [facility] (twice).  Pt able to stand ans support own weight however requires assist to complete turns and unable to take functional steps when attempted.    Follow Up Recommendations  Home health PT (family declining rec for SNF and plan to provide 24/7 care)     Equipment Recommendations       Recommendations for Other Services       Precautions / Restrictions Precautions Precautions: Fall Precaution Comments: Hx Alzheimers Restrictions Weight Bearing Restrictions: No    Mobility  Bed Mobility Overal bed mobility: Needs Assistance Bed Mobility: Supine to Sit     Supine to sit: Max assist;+2 for physical assistance;+2 for safety/equipment     General bed mobility comments: increased time and use of pad to complete scooting.  L UE in sling.  Once upright, pt was able to sit EOB with Min Assist but max VC's to stay upright.    Transfers Overall transfer level: Needs assistance Equipment used: None Transfers: Sit to/from Stand Sit to Stand: Max assist;+2 physical assistance;+2 safety/equipment         General transfer comment: .  MAX VC's for direction.  Assisted off elevated bad and on/off BSC twice.    Ambulation/Gait             General Gait Details: attempted + 2 HHA however pt unable to functionally step.  Recliner and BSC swithched out from behind.    Stairs            Wheelchair Mobility    Modified Rankin (Stroke Patients Only)       Balance                                    Cognition Arousal/Alertness: Awake/alert   Overall Cognitive Status:  History of cognitive impairments - at baseline Area of Impairment: Safety/judgement;Awareness;Problem solving       Following Commands: Follows one step commands inconsistently            Exercises      General Comments        Pertinent Vitals/Pain      Home Living                      Prior Function            PT Goals (current goals can now be found in the care plan section) Progress towards PT goals: Progressing toward goals    Frequency  Min 3X/week    PT Plan      Co-evaluation             End of Session Equipment Utilized During Treatment: Gait belt Activity Tolerance: Patient tolerated treatment well Patient left: in chair;with call bell/phone within reach;with family/visitor present     Time: 1100-1130 PT Time Calculation (min) (ACUTE ONLY): 30 min  Charges:  $Therapeutic Activity: 8-22 mins                    G Codes:  Rica Koyanagi  PTA [facility]  Acute  Rehab Pager      [phone]

## 2015-08-10 NOTE — Clinical Social Work Placement (Signed)
CSW spoke with patient's daughter, Anderson [place] to confirm that they are agreeable with plan for SNF and requesting [place]. CSW spoke with Ivin Booty at [facility] to confirm that they would be able to take patient when ready. CSW has completed FL2 & will continue to follow and assist with discharge when ready.    Raynaldo Opitz, [facility] Clinical Social Worker cell #: [phone]     CLINICAL SOCIAL WORK PLACEMENT  NOTE  Date:  08/10/2015  Patient Details  Name: Douglas Mckee MRN: [id number] Date of Birth: 1922/12/26  Clinical Social Work is seeking post-discharge placement for this patient at the [place] level of care (*CSW will initial, date and re-position this form in  chart as items are completed):  Yes   Patient/family provided with [place] Work Department's list of facilities offering this level of care within the geographic area requested by the patient (or if unable, by the patient's family).  Yes   Patient/family informed of their freedom to choose among providers that offer the needed level of care, that participate in Medicare, Medicaid or managed care program needed by the patient, have an available bed and are willing to accept the patient.  Yes   Patient/family informed of [facility]'s ownership interest in [facility] and [facility], as well as of the fact that they are under no obligation to receive care at these facilities.  PASRR submitted to EDS on 08/10/15     PASRR number received on 08/10/15     Existing PASRR number confirmed on       FL2 transmitted to all facilities in geographic area requested by pt/family on 08/10/15     FL2 transmitted to all facilities within larger geographic area on       Patient informed that his/her managed care company has contracts with or will negotiate with certain facilities, including the following:        Yes   Patient/family informed of bed offers  received.  Patient chooses bed at [facility]     Physician recommends and patient chooses bed at      Patient to be transferred to [facility] on  .  Patient to be transferred to facility by       Patient family notified on   of transfer.  Name of family member notified:        PHYSICIAN       Additional Comment:    _______________________________________________ Standley Brooking, LCSW 08/10/2015, 2:28 PM

## 2015-08-11 ENCOUNTER — Inpatient Hospital Stay (HOSPITAL_COMMUNITY): Payer: Medicare Other

## 2015-08-11 DIAGNOSIS — J189 Pneumonia, unspecified organism: Secondary | ICD-10-CM

## 2015-08-11 LAB — CULTURE, BLOOD (ROUTINE X 2)
CULTURE: NO GROWTH
Culture: NO GROWTH

## 2015-08-11 LAB — BASIC METABOLIC PANEL
Anion gap: 8 (ref 5–15)
BUN: 16 mg/dL (ref 6–20)
CALCIUM: 8.5 mg/dL — AB (ref 8.9–10.3)
CO2: 28 mmol/L (ref 22–32)
CREATININE: 0.98 mg/dL (ref 0.61–1.24)
Chloride: 107 mmol/L (ref 101–111)
GLUCOSE: 114 mg/dL — AB (ref 65–99)
POTASSIUM: 3.8 mmol/L (ref 3.5–5.1)
Sodium: 143 mmol/L (ref 135–145)

## 2015-08-11 LAB — CBC
HEMATOCRIT: 27.2 % — AB (ref 39.0–52.0)
Hemoglobin: 8.8 g/dL — ABNORMAL LOW (ref 13.0–17.0)
MCH: 22.1 pg — ABNORMAL LOW (ref 26.0–34.0)
MCHC: 32.4 g/dL (ref 30.0–36.0)
MCV: 68.3 fL — ABNORMAL LOW (ref 78.0–100.0)
PLATELETS: 372 10*3/uL (ref 150–400)
RBC: 3.98 MIL/uL — ABNORMAL LOW (ref 4.22–5.81)
RDW: 16.4 % — AB (ref 11.5–15.5)
WBC: 13.2 10*3/uL — ABNORMAL HIGH (ref 4.0–10.5)

## 2015-08-11 MED ORDER — LEVOFLOXACIN 500 MG PO TABS
500.0000 mg | ORAL_TABLET | Freq: Every day | ORAL | Status: DC
  Administered 2015-08-11 – 2015-08-12 (×2): 500 mg via ORAL
  Filled 2015-08-11 (×2): qty 1

## 2015-08-11 NOTE — Progress Notes (Signed)
Pharmacy Antibiotic Note  Douglas Mckee is a 82 y.o. male admitted on 08/06/2015 with pneumonia.  Pharmacy has been consulted for levofloxacin dosing.  Plan: Levofloxacin 500 mg PO daily  Height: 5\' 11"  (180.3 cm) Weight: 160 lb 14.3 oz (72.98 kg) IBW/kg (Calculated) : 75.3  Temp (24hrs), Avg:98 F (36.7 C), Min:97.7 F (36.5 C), Max:98.2 F (36.8 C)   Recent Labs Lab 08/06/15 1502 08/06/15 1512 08/06/15 1520 08/06/15 1955 08/06/15 2243 08/07/15 0605 08/08/15 0606 08/09/15 0509 08/11/15 0526  WBC  --  19.5*  --   --   --  12.9* 7.6 8.6 13.2*  CREATININE 1.33*  --   --   --   --  1.02 1.13 1.22 0.98  LATICACIDVEN  --   --  2.65* 2.5* 1.5  --   --   --   --     Estimated Creatinine Clearance: 51.7 mL/min (by C-G formula based on Cr of 0.98).    No Known Allergies  Antimicrobials this admission: 3/10 >> azithromycin x 1 3/10 >> zosyn >> 3/15 3/11 >> Tamiflu >> 3/15 3/15 >> levofloxacin >>  Dose adjustments this admission: ----  Microbiology results: 3/10 BCx: NGTD 3/10 UCx: NGF 3/10 Influenza: + Influenza A  Thank you for allowing pharmacy to be a part of this patient's care.   Royetta Asal, PharmD, BCPS Pager [phone] 08/11/2015 9:01 AM

## 2015-08-11 NOTE — Progress Notes (Addendum)
Patient ID: Douglas Mckee, male   DOB: 11/21/1922, 82 y.o.   MRN: [id number] [facility] PROGRESS NOTE  Douglas Mckee [id number] DOB: 1922/07/08 DOA: 08/06/2015 PCP: Verline Lema, MD  Brief narrative:    82 year old gentleman with a past medical history of dementia, residing in the community, admitted to [facility] long hospital 08/06/2015 with progressive functional decline. He also had a fall about a week prior to the admission. Per family report patient has been less interactive and has minimal by mouth intake, excessive daytime sleepiness. The family was also concerned about exposure to family members who had flu.  On this admission, patient was found to have right lower lobe infiltrate consistent with pneumonia. He is white blood cell count was 19, lactic acid 2.65. He is influenza panel was positive for influenza a for which reason he is on Tamiflu.  Assessment/Plan:    Principal problem: Sepsis secondary to influenza A pneumonia / right lower lobe pneumonia / aspiration pneumonitis - Sepsis criteria met on the admission with elevated white blood cell count, lactic acidosis, tachycardia and evidence of pneumonia on chest x-ray. Source of infection is influenza A virus. Continue Tamiflu. - aspiration pneumonia also suspected considering right lower lobe pneumonia seen on chest x-ray. He is on Zosyn but we'll switch to Levaquin today in anticipation of discharge tomorrow to skilled nursing facility. - Respiratory status remains stable - Blood cultures negative so far  Active problems: Aspiration risk - Secondary to progressive dementia - Per SLP evaluation on 08/07/2015 - dysphagia 1 diet   Functional decline / failure to thrive - Likely secondary to progressive dementia - D/C to SNF per PT recommendations   Acute kidney injury - Secondary to sepsis - Creatinine normalized with IV fluids  Left humeral neck fracture - S/P fall - Per ortho, non operative management    Microcytic anemia - Hemoglobin is stable at 8.9   DVT Prophylaxis  - Lovenox subQ in hospital   Code Status: Full.  Family Communication:  Family not at the bedside, updated over the phone daily Disposition Plan: To SNF 08/12/2015   IV access:  Peripheral IV  Procedures and diagnostic studies:    Dg Chest 2 View 08/07/2015  1. Persistent right basilar pneumonia. 2. Proximal left humerus fracture.   Dg Chest 2 View 08/06/2015  Right lower lobe infiltrate consistent with pneumonia Fracture left humeral neck   Ct Head Wo Contrast 08/06/2015 Atrophy and chronic ischemia.  No acute abnormality.   Dg Humerus Left 08/06/2015 There is minimal impacted comminuted fracture of the left humeral neck extending in humeral head.   Medical Consultants:  None   Other Consultants:  PT SLP  IAnti-Infectives:   Zosyn 08/06/2015 --> 08/11/2015 Levaquin 08/11/2015 --> Tamiflu 08/07/2015 -->   Leisa Lenz, MD  [facility] Pager [phone]  Time spent in minutes: 15 minutes  If 7PM-7AM, please contact night-coverage [email or web address] Password [email or web address] 08/11/2015, 9:10 AM   LOS: 5 days    HPI/Subjective: No acute overnight events. Feels better this am.   Objective: Filed Vitals:   08/10/15 0618 08/10/15 1340 08/10/15 2138 08/11/15 0610  BP: 151/69 142/59 157/66 153/71  Pulse: 88 92 96 88  Temp: 98.2 F (36.8 C) 97.7 F (36.5 C) 98.2 F (36.8 C) 98 F (36.7 C)  TempSrc: Oral Oral Oral Oral  Resp: _0 Height:      Weight:      SpO2: 99% 98% 98% 100%    Intake/Output Summary (Last 24 hours)  at 08/11/15 0910 Last data filed at 08/11/15 0700  Gross per 24 hour  Intake    270 ml  Output   1600 ml  Net  -1330 ml    Exam:   General:  Pt is not in distress  Cardiovascular: Rate controlled, appreciate S1, S2   Respiratory: bilateral air entry, no wheezing   Abdomen: non tender, non distended, (+) BS  Extremities: No edema, bilateral pulses    Neuro: No focal  deficits   Data Reviewed: Basic Metabolic Panel:  Recent Labs Lab 08/06/15 1502 08/07/15 0605 08/08/15 0606 08/09/15 0509 08/11/15 0526  NA 135 138 143 140 143  K 4.3 4.0 3.8 3.5 3.8  CL 99* 105 110 107 107  CO2 _0 GLUCOSE 152* 92 98 107* 114*  BUN 30* 25* 31* 23* 16  CREATININE 1.33* 1.02 1.13 1.22 0.98  CALCIUM 8.4* 8.0* 7.9* 7.9* 8.5*   Liver Function Tests:  Recent Labs Lab 08/06/15 1502  AST 40  ALT 20  ALKPHOS 66  BILITOT 1.1  PROT 6.9  ALBUMIN 3.5   No results for input(s): LIPASE, AMYLASE in the last 168 hours. No results for input(s): AMMONIA in the last 168 hours. CBC:  Recent Labs Lab 08/06/15 1512 08/07/15 0605 08/08/15 0606 08/09/15 0509 08/11/15 0526  WBC 19.5* 12.9* 7.6 8.6 13.2*  NEUTROABS 16.6*  --   --   --   --   HGB 11.3* 10.0* 8.8* 8.5* 8.8*  HCT 35.3* 30.5* 27.8* 26.5* 27.2*  MCV 71.3* 69.5* 71.8* 70.9* 68.3*  PLT 193 151 234 267 372   Cardiac Enzymes: No results for input(s): CKTOTAL, CKMB, CKMBINDEX, TROPONINI in the last 168 hours. BNP: Invalid input(s): POCBNP CBG: No results for input(s): GLUCAP in the last 168 hours.  Recent Results (from the past 240 hour(s))  Culture, blood (routine x 2)     Status: None (Preliminary result)   Collection Time: 08/06/15  3:02 PM  Result Value Ref Range Status   Specimen Description BLOOD RIGHT ANTECUBITAL  Final   Special Requests BOTTLES DRAWN AEROBIC AND ANAEROBIC 5CC  Final   Culture   Final    NO GROWTH 4 DAYS Performed at [facility]    Report Status PENDING  Incomplete  Culture, blood (routine x 2)     Status: None (Preliminary result)   Collection Time: 08/06/15  3:10 PM  Result Value Ref Range Status   Specimen Description BLOOD RIGHT ARM  Final   Special Requests BOTTLES DRAWN AEROBIC AND ANAEROBIC 5CC  Final   Culture   Final    NO GROWTH 4 DAYS Performed at [facility]    Report Status PENDING  Incomplete  Urine culture     Status: None    Collection Time: 08/06/15  3:33 PM  Result Value Ref Range Status   Specimen Description URINE, CATHETERIZED  Final   Special Requests NONE  Final   Culture   Final    NO GROWTH 2 DAYS Performed at [facility]    Report Status 08/08/2015 FINAL  Final     Scheduled Meds: . enoxaparin (LOVENOX) injection  40 mg Subcutaneous Q24H  . levofloxacin  500 mg Oral Daily  . oseltamivir  30 mg Oral BID  . sodium chloride flush  3 mL Intravenous Q12H  . tamsulosin  0.4 mg Oral Daily

## 2015-08-12 DIAGNOSIS — R2681 Unsteadiness on feet: Secondary | ICD-10-CM | POA: Diagnosis not present

## 2015-08-12 DIAGNOSIS — G9340 Encephalopathy, unspecified: Secondary | ICD-10-CM | POA: Diagnosis not present

## 2015-08-12 DIAGNOSIS — R531 Weakness: Secondary | ICD-10-CM | POA: Diagnosis not present

## 2015-08-12 DIAGNOSIS — J101 Influenza due to other identified influenza virus with other respiratory manifestations: Secondary | ICD-10-CM | POA: Diagnosis not present

## 2015-08-12 DIAGNOSIS — S42302S Unspecified fracture of shaft of humerus, left arm, sequela: Secondary | ICD-10-CM | POA: Diagnosis not present

## 2015-08-12 DIAGNOSIS — N400 Enlarged prostate without lower urinary tract symptoms: Secondary | ICD-10-CM | POA: Diagnosis not present

## 2015-08-12 DIAGNOSIS — D72829 Elevated white blood cell count, unspecified: Secondary | ICD-10-CM | POA: Diagnosis not present

## 2015-08-12 DIAGNOSIS — D509 Iron deficiency anemia, unspecified: Secondary | ICD-10-CM | POA: Diagnosis not present

## 2015-08-12 DIAGNOSIS — I10 Essential (primary) hypertension: Secondary | ICD-10-CM | POA: Diagnosis not present

## 2015-08-12 DIAGNOSIS — N401 Enlarged prostate with lower urinary tract symptoms: Secondary | ICD-10-CM | POA: Diagnosis not present

## 2015-08-12 DIAGNOSIS — D638 Anemia in other chronic diseases classified elsewhere: Secondary | ICD-10-CM | POA: Diagnosis not present

## 2015-08-12 DIAGNOSIS — R1312 Dysphagia, oropharyngeal phase: Secondary | ICD-10-CM | POA: Diagnosis not present

## 2015-08-12 DIAGNOSIS — Z8673 Personal history of transient ischemic attack (TIA), and cerebral infarction without residual deficits: Secondary | ICD-10-CM | POA: Diagnosis not present

## 2015-08-12 DIAGNOSIS — N390 Urinary tract infection, site not specified: Secondary | ICD-10-CM | POA: Diagnosis not present

## 2015-08-12 DIAGNOSIS — D649 Anemia, unspecified: Secondary | ICD-10-CM | POA: Diagnosis not present

## 2015-08-12 DIAGNOSIS — J09X1 Influenza due to identified novel influenza A virus with pneumonia: Secondary | ICD-10-CM | POA: Diagnosis not present

## 2015-08-12 DIAGNOSIS — M79602 Pain in left arm: Secondary | ICD-10-CM | POA: Diagnosis not present

## 2015-08-12 DIAGNOSIS — Z4789 Encounter for other orthopedic aftercare: Secondary | ICD-10-CM | POA: Diagnosis not present

## 2015-08-12 DIAGNOSIS — R1310 Dysphagia, unspecified: Secondary | ICD-10-CM | POA: Diagnosis not present

## 2015-08-12 DIAGNOSIS — A419 Sepsis, unspecified organism: Secondary | ICD-10-CM | POA: Diagnosis not present

## 2015-08-12 DIAGNOSIS — S42302D Unspecified fracture of shaft of humerus, left arm, subsequent encounter for fracture with routine healing: Secondary | ICD-10-CM | POA: Diagnosis not present

## 2015-08-12 DIAGNOSIS — J189 Pneumonia, unspecified organism: Secondary | ICD-10-CM | POA: Diagnosis not present

## 2015-08-12 DIAGNOSIS — S42302A Unspecified fracture of shaft of humerus, left arm, initial encounter for closed fracture: Secondary | ICD-10-CM | POA: Diagnosis not present

## 2015-08-12 DIAGNOSIS — Z79899 Other long term (current) drug therapy: Secondary | ICD-10-CM | POA: Diagnosis not present

## 2015-08-12 DIAGNOSIS — R5381 Other malaise: Secondary | ICD-10-CM | POA: Diagnosis not present

## 2015-08-12 DIAGNOSIS — M6281 Muscle weakness (generalized): Secondary | ICD-10-CM | POA: Diagnosis not present

## 2015-08-12 DIAGNOSIS — F0390 Unspecified dementia without behavioral disturbance: Secondary | ICD-10-CM | POA: Diagnosis not present

## 2015-08-12 DIAGNOSIS — N179 Acute kidney failure, unspecified: Secondary | ICD-10-CM | POA: Diagnosis not present

## 2015-08-12 DIAGNOSIS — J690 Pneumonitis due to inhalation of food and vomit: Secondary | ICD-10-CM | POA: Diagnosis not present

## 2015-08-12 DIAGNOSIS — A413 Sepsis due to Hemophilus influenzae: Secondary | ICD-10-CM | POA: Diagnosis not present

## 2015-08-12 DIAGNOSIS — D473 Essential (hemorrhagic) thrombocythemia: Secondary | ICD-10-CM | POA: Diagnosis not present

## 2015-08-12 DIAGNOSIS — R488 Other symbolic dysfunctions: Secondary | ICD-10-CM | POA: Diagnosis not present

## 2015-08-12 LAB — BASIC METABOLIC PANEL
Anion gap: 8 (ref 5–15)
BUN: 16 mg/dL (ref 4–21)
BUN: 16 mg/dL (ref 6–20)
CALCIUM: 8.5 mg/dL — AB (ref 8.9–10.3)
CO2: 26 mmol/L (ref 22–32)
CREATININE: 1 mg/dL (ref 0.6–1.3)
Chloride: 106 mmol/L (ref 101–111)
Creatinine, Ser: 0.96 mg/dL (ref 0.61–1.24)
GFR calc Af Amer: >60 mL/min (ref >60)
GLUCOSE: 103 mg/dL
GLUCOSE: 103 mg/dL — AB (ref 65–99)
POTASSIUM: 4.1 mmol/L (ref 3.5–5.1)
SODIUM: 140 mmol/L (ref 137–147)
Sodium: 140 mmol/L (ref 135–145)

## 2015-08-12 LAB — CBC AND DIFFERENTIAL: WBC: 12.5 10*3/mL

## 2015-08-12 LAB — CBC
HEMATOCRIT: 29.4 % — AB (ref 39.0–52.0)
Hemoglobin: 9.3 g/dL — ABNORMAL LOW (ref 13.0–17.0)
MCH: 22.5 pg — AB (ref 26.0–34.0)
MCHC: 31.6 g/dL (ref 30.0–36.0)
MCV: 71.2 fL — ABNORMAL LOW (ref 78.0–100.0)
Platelets: 452 10*3/uL — ABNORMAL HIGH (ref 150–400)
RBC: 4.13 MIL/uL — ABNORMAL LOW (ref 4.22–5.81)
RDW: 16.6 % — AB (ref 11.5–15.5)
WBC: 12.5 10*3/uL — ABNORMAL HIGH (ref 4.0–10.5)

## 2015-08-12 MED ORDER — ACETAMINOPHEN 325 MG PO TABS: 650.0000 mg | ORAL_TABLET | Freq: Four times a day (QID) | ORAL | Status: DC | PRN

## 2015-08-12 MED ORDER — ALBUTEROL SULFATE (2.5 MG/3ML) 0.083% IN NEBU
2.5000 mg | INHALATION_SOLUTION | RESPIRATORY_TRACT | Status: DC | PRN
Start: 2015-08-12 — End: 2015-12-10

## 2015-08-12 MED ORDER — LEVOFLOXACIN 500 MG PO TABS: 500.0000 mg | ORAL_TABLET | Freq: Every day | ORAL | Status: DC

## 2015-08-12 MED ORDER — ONDANSETRON HCL 4 MG PO TABS: 4.0000 mg | ORAL_TABLET | Freq: Four times a day (QID) | ORAL | Status: DC | PRN

## 2015-08-12 MED ORDER — RESOURCE THICKENUP CLEAR PO POWD
1.0000 | ORAL | Status: DC | PRN
Start: 2015-08-12 — End: 2015-08-31

## 2015-08-12 NOTE — Progress Notes (Signed)
Report called to [facility]. Hospital course reviewed and all questions answered. Callie Fielding RN

## 2015-08-12 NOTE — Progress Notes (Signed)
Physical Therapy Treatment Patient Details Name: Douglas Mckee MRN: [id number] DOB: 05/12/1923 Today's Date: 08/12/2015    History of Present Illness 82 yo male admitted with sepsis, +flu, L shoulder fx (conservative management per ortho). Hx of dementia, CVA.     PT Comments    Pt in bed incont BM and condom cath off.  Assisted to [facility] to + 2 assist. Assisted with hygiene.  Attempted to amb + 2 assist however unable to take functional steps.  Assisted 1/4 turn to recliner.    Follow Up Recommendations  SNF (family has decided to D/C to SNF as indicated on LPT eval.  )     Equipment Recommendations       Recommendations for Other Services       Precautions / Restrictions Precautions Precautions: Fall Precaution Comments: Hx Alzheimers Restrictions Weight Bearing Restrictions: No    Mobility  Bed Mobility Overal bed mobility: Needs Assistance Bed Mobility: Supine to Sit Rolling: Total assist;+2 for physical assistance         General bed mobility comments: increased time and use of pad to complete scooting.  L UE in sling.  Once upright, pt was able to sit EOB with Min Assist but max VC's to stay upright.    Transfers Overall transfer level: Needs assistance Equipment used: None Transfers: Sit to/from Stand Sit to Stand: Max assist;+2 physical assistance;+2 safety/equipment         General transfer comment: .  MAX VC's for direction.  Assisted off elevated bad and on/off BSC.    Ambulation/Gait             General Gait Details: attempted + 2 HHA however unable to functionally take steps.  Performed 1/4 pivot only from [facility] to recliner.   Stairs            Wheelchair Mobility    Modified Rankin (Stroke Patients Only)       Balance                                    Cognition Arousal/Alertness: Awake/alert   Overall Cognitive Status: History of cognitive impairments - at baseline Area of Impairment:  Safety/judgement;Awareness;Problem solving               General Comments: responds best to "Daddy"    Exercises      General Comments        Pertinent Vitals/Pain Pain Assessment: No/denies pain    Home Living                      Prior Function            PT Goals (current goals can now be found in the care plan section) Progress towards PT goals: Progressing toward goals    Frequency  Min 3X/week    PT Plan Current plan remains appropriate    Co-evaluation             End of Session Equipment Utilized During Treatment: Gait belt Activity Tolerance: Patient tolerated treatment well Patient left: in chair;with call bell/phone within reach;with family/visitor present     Time: [id number] PT Time Calculation (min) (ACUTE ONLY): 25 min  Charges:  $Therapeutic Activity: 23-37 mins                    G Codes:      Latravion Graves  PTA [employer]  Acute  Rehab Pager      [phone]

## 2015-08-12 NOTE — Discharge Instructions (Signed)
Aspiration Pneumonia  Aspiration pneumonia is an infection in your lungs. It occurs when food, liquid, or stomach contents (vomit) are inhaled (aspirated) into your lungs. When these things get into your lungs, swelling (inflammation) and infection can occur. This can make it difficult for you to breathe. Aspiration pneumonia is a serious condition and can be life threatening. RISK FACTORS Aspiration pneumonia is more likely to occur when a person's cough (gag) reflex or ability to swallow has been decreased. Some things that can do this include:   Having a brain injury or disease, such as stroke, seizures, Parkinson's disease, dementia, or amyotrophic lateral sclerosis (ALS).   Being given general anesthetic for procedures.   Being in a coma (unconscious).   Having a narrowing of the tube that carries food to the stomach (esophagus).   Drinking too much alcohol. If a person passes out and vomits, vomit can be swallowed into the lungs.   Taking certain medicines, such as tranquilizers or sedatives.  SIGNS AND SYMPTOMS   Coughing after swallowing food or liquids.   Breathing problems, such as wheezing or shortness of breath.   Bluish skin. This can be caused by lack of oxygen.   Coughing up food or mucus. The mucus might contain blood, greenish material, or yellowish-white fluid (pus).   Fever.   Chest pain.   Being more tired than usual (fatigue).   Sweating more than usual.   Bad breath.  DIAGNOSIS  A physical exam will be done. During the exam, the health care provider will listen to your lungs with a stethoscope to check for:   Crackling sounds in the lungs.  Decreased breath sounds.  A rapid heartbeat. Various tests may be ordered. These may include:   Chest X-ray.   CT scan.   Swallowing study. This test looks at how food is swallowed and whether it goes into your breathing tube (trachea) or food pipe (esophagus).   Sputum culture. Saliva and  mucus (sputum) are collected from the lungs or the tubes that carry air to the lungs (bronchi). The sputum is then tested for bacteria.   Bronchoscopy. This test uses a flexible tube (bronchoscope) to see inside the lungs. TREATMENT  Treatment will usually include antibiotic medicines. Other medicines may also be used to reduce fever or pain. You may need to be treated in the hospital. In the hospital, your breathing will be carefully monitored. Depending on how well you are breathing, you may need to be given oxygen, or you may need breathing support from a breathing machine (ventilator). For people who fail a swallowing study, a feeding tube might be placed in the stomach, or they may be asked to avoid certain food textures or liquids when they eat. HOME CARE INSTRUCTIONS   Carefully follow any special eating instructions you were given, such as avoiding certain food textures or thickening liquids. This reduces the risk of developing aspiration pneumonia again.  Only take over-the-counter or prescription medicines as directed by your health care provider. Follow the directions carefully.   If you were prescribed antibiotics, take them as directed. Finish them even if you start to feel better.   Rest as instructed by your health care provider.   Keep all follow-up appointments with your health care provider.  SEEK MEDICAL CARE IF:   You develop worsening shortness of breath, wheezing, or difficulty breathing.   You develop a fever.   You have chest pain.  MAKE SURE YOU:   Understand these instructions.  Will watch   your condition.  Will get help right away if you are not doing well or get worse.   This information is not intended to replace advice given to you by your health care provider. Make sure you discuss any questions you have with your health care provider.   Document Released: 03/12/2009 Document Revised: 05/20/2013 Document Reviewed: 10/31/2012 [employer]  Interactive Patient Education 2016 [employer]  

## 2015-08-12 NOTE — Discharge Summary (Signed)
Physician Discharge Summary  Douglas Mckee MRN:[id number] DOB: 11/02/1922 DOA: 08/06/2015  PCP: PERRY, ANGELA L, MD  Admit date: 08/06/2015 Discharge date: 08/12/2015  Recommendations for Outpatient Follow-up:  Take Levaquin for 7 more days on discharge Please have palliative care service in SNF address goals of care  Discharge Diagnoses:  Principal Problem:   Sepsis (HCC) Active Problems:   Metabolic encephalopathy   Dementia   Pneumonia   AKI (acute kidney injury) (HCC)   Left humeral fracture    Discharge Condition: stable   Diet recommendation: as tolerated   History of present illness:  82-year-old gentleman with a past medical history of dementia, residing in the community, admitted to  hospital 08/06/2015 with progressive functional decline. He also had a fall about a week prior to the admission. Per family report patient has been less interactive and has minimal by mouth intake, excessive daytime sleepiness. The family was also concerned about exposure to family members who had flu.  On this admission, patient was found to have right lower lobe infiltrate consistent with pneumonia. He is white blood cell count was 19, lactic acid 2.65. He is influenza panel was positive for influenza a for which reason he was started on Tamiflu.  Hospital Course:  Assessment/Plan:    Principal problem: Sepsis secondary to influenza A pneumonia / leukocytosis - Sepsis criteria were met on the admission with elevated white blood cell count, lactic acidosis, tachycardia and evidence of pneumonia on chest x-ray. Source of infection presumed influenza A virus. Pt was on Tamiflu from 3/11 through 3/16. - Stable respiratory status  - Leukocytosis improving    Active problems: Aspiration risk / Aspiration pneumonia right lower lobe  - Aspiration pneumonia also suspected considering right lower lobe pneumonia seen on chest x-ray. - Repeat CXR 3/15 showed improvement - Pt was on  zosyn and this was changed to Levaquin 3/15. He will continue Levaquin for 7 days on discharge. - Blood cultures negative so far - Per SLP evaluation on 08/07/2015 - dysphagia 1 diet   Functional decline / failure to thrive - Likely secondary to progressive dementia  Acute kidney injury - Secondary to sepsis - Creatinine normalized with fluids  Left humeral neck fracture - S/P fall - Per ortho, non operative management   Microcytic anemia - Hemoglobin is stable at 9.3 - No current indications for transfusion   DVT Prophylaxis  - Lovenox subQ in hospital   Code Status: Full.  Family Communication: Family not at the bedside, updated over the phone daily   IV access:  Peripheral IV  Procedures and diagnostic studies:   Dg Chest 2 View 08/07/2015 1. Persistent right basilar pneumonia. 2. Proximal left humerus fracture.   Dg Chest 2 View 08/06/2015 Right lower lobe infiltrate consistent with pneumonia Fracture left humeral neck   Ct Head Wo Contrast 08/06/2015 Atrophy and chronic ischemia. No acute abnormality.   Dg Humerus Left 08/06/2015 There is minimal impacted comminuted fracture of the left humeral neck extending in humeral head.   Medical Consultants:  None   Other Consultants:  PT SLP  IAnti-Infectives:   Zosyn 08/06/2015 --> 08/11/2015 Levaquin 08/11/2015 --> for 7 more days on discharge  Tamiflu 08/07/2015 --> 08/12/2015     Signed:  , , MD  [facility] 08/12/2015, 8:31 AM  Pager #: [phone]  Time spent in minutes: more than 30 minutes  Discharge Exam: Filed Vitals:   08/11/15 2151 08/12/15 0543  BP: 145/58 144/73  Pulse: 102 95  Temp: 98.7   F (37.1 C) 98.6 F (37 C)  Resp: 18 18   Filed Vitals:   08/11/15 0610 08/11/15 1300 08/11/15 2151 08/12/15 0543  BP: 153/71 153/62 145/58 144/73  Pulse: 88 82 102 95  Temp: 98 F (36.7 C) 98 F (36.7 C) 98.7 F (37.1 C) 98.6 F (37 C)  TempSrc: Oral Oral  Oral Oral  Resp: _0 Height:      Weight:      SpO2: 100% 100% 99% 98%    General: Pt is alert, not in acute distress Cardiovascular: Regular rate and rhythm, S1/S2 + Respiratory: Clear to auscultation bilaterally, no wheezing, no crackles, no rhonchi Abdominal: Soft, non tender, non distended, bowel sounds +, no guarding Extremities: no edema, no cyanosis, pulses palpable bilaterally DP and PT Neuro: Grossly nonfocal  Discharge Instructions  Discharge Instructions    Call MD for:  difficulty breathing, headache or visual disturbances    Complete by:  As directed      Call MD for:  persistant dizziness or light-headedness    Complete by:  As directed      Call MD for:  persistant nausea and vomiting    Complete by:  As directed      Call MD for:  severe uncontrolled pain    Complete by:  As directed      Diet - low sodium heart healthy    Complete by:  As directed      Discharge instructions    Complete by:  As directed   Take Levaquin for 7 more days on discharge Please have palliative care service in SNF address goals of care     Increase activity slowly    Complete by:  As directed             Medication List    TAKE these medications        acetaminophen 325 MG tablet  Commonly known as:  TYLENOL  Take 2 tablets (650 mg total) by mouth every 6 (six) hours as needed for mild pain (or Fever >/= 101).     albuterol (2.5 MG/3ML) 0.083% nebulizer solution  Commonly known as:  PROVENTIL  Take 3 mLs (2.5 mg total) by nebulization every 4 (four) hours as needed for wheezing or shortness of breath.     ALKA SELTZER PLUS PO  Take 1 each by mouth every 6 (six) hours as needed (flu symotoms).     fish oil-omega-3 fatty acids 1000 MG capsule  Take 1 g by mouth every morning.     levofloxacin 500 MG tablet  Commonly known as:  LEVAQUIN  Take 1 tablet (500 mg total) by mouth daily.     multivitamin with minerals Tabs tablet  Take 1 tablet by mouth every  morning.     ondansetron 4 MG tablet  Commonly known as:  ZOFRAN  Take 1 tablet (4 mg total) by mouth every 6 (six) hours as needed for nausea.     RESOURCE THICKENUP CLEAR Powd  Take 120 g by mouth as needed.     tamsulosin 0.4 MG Caps capsule  Commonly known as:  FLOMAX  Take 1 capsule (0.4 mg total) by mouth daily.           Follow-up Information    Follow up with PERRY, ANGELA L, MD. Schedule an appointment as soon as possible for a visit in 1 week.   Specialty:  Internal Medicine   Why:  Follow up appt after recent  hospitalization   Contact information:   [address] [place] [address] [phone]        The results of significant diagnostics from this hospitalization (including imaging, microbiology, ancillary and laboratory) are listed below for reference.    Significant Diagnostic Studies: Dg Chest 2 View  08/07/2015  CLINICAL DATA:  Cough and congestion. EXAM: CHEST - 2 VIEW COMPARISON:  Two-view chest x-ray 08/06/2015. FINDINGS: The heart size is exaggerated by low lung volumes. A right basilar pneumonia is again noted. The proximal left humerus fracture is again seen. IMPRESSION: 1. Persistent right basilar pneumonia. 2. Proximal left humerus fracture. Electronically Signed   By: Christopher  Mattern M.D.   On: 08/07/2015 08:21   Dg Chest 2 View  08/06/2015  CLINICAL DATA:  Fever.  Recent fall EXAM: CHEST  2 VIEW COMPARISON:  11/06/2014 FINDINGS: Right lower lobe infiltrate is new and may represent pneumonia. Small right effusion Left lung is clear.  Negative for heart failure Fracture left humeral neck which appears acute. IMPRESSION: Right lower lobe infiltrate consistent with pneumonia Fracture left humeral neck Electronically Signed   By: Charles  Clark M.D.   On: 08/06/2015 14:29   Ct Head Wo Contrast  08/06/2015  CLINICAL DATA:  Fell and hit head EXAM: CT HEAD WITHOUT CONTRAST TECHNIQUE: Contiguous axial images were obtained from the base of the skull  through the vertex without intravenous contrast. COMPARISON:  CT head 10/18/2012 FINDINGS: Advanced atrophy. Chronic microvascular ischemic changes in the white matter. Negative for acute infarct. Negative for acute hemorrhage or mass lesion. Atherosclerotic calcifications circle of Willis. No acute skull abnormality. Negative for fracture. IMPRESSION: Atrophy and chronic ischemia.  No acute abnormality. Electronically Signed   By: Charles  Clark M.D.   On: 08/06/2015 15:03   Dg Chest Port 1 View  08/11/2015  CLINICAL DATA:  Pneumonia. EXAM: PORTABLE CHEST 1 VIEW COMPARISON:  08/07/2015 and 08/06/2015 FINDINGS: Exam demonstrates low lung volumes with improving mild opacification in the right base likely resolving infection or atelectasis. Minimal linear density in the left base likely atelectasis. No evidence of effusion. Cardiomediastinal silhouette is within normal. There are degenerative changes of the spine. Evidence of patient's acute left humeral neck/ head fracture. IMPRESSION: Interval improvement in minimal right base opacification likely atelectasis or infection. Subtle linear left base opacification likely atelectasis. Stable known acute left humeral neck/ head fracture. Electronically Signed   By: Daniel  Boyle M.D.   On: 08/11/2015 11:35   Dg Humerus Left  08/06/2015  CLINICAL DATA:  Fall on left on Wednesday EXAM: LEFT HUMERUS - 2+ VIEW COMPARISON:  None. FINDINGS: Four views of the left humerus submitted. There is minimal impacted comminuted fracture of the left humeral neck extending in humeral head. IMPRESSION: There is minimal impacted comminuted fracture of the left humeral neck extending in humeral head. Electronically Signed   By: Liviu  Pop M.D.   On: 08/06/2015 14:28    Microbiology: Recent Results (from the past 240 hour(s))  Culture, blood (routine x 2)     Status: None   Collection Time: 08/06/15  3:02 PM  Result Value Ref Range Status   Specimen Description BLOOD RIGHT  ANTECUBITAL  Final   Special Requests BOTTLES DRAWN AEROBIC AND ANAEROBIC 5CC  Final   Culture   Final    NO GROWTH 5 DAYS Performed at [facility]    Report Status 08/11/2015 FINAL  Final  Culture, blood (routine x 2)     Status: None   Collection Time:   08/06/15  3:10 PM  Result Value Ref Range Status   Specimen Description BLOOD RIGHT ARM  Final   Special Requests BOTTLES DRAWN AEROBIC AND ANAEROBIC 5CC  Final   Culture   Final    NO GROWTH 5 DAYS Performed at [facility]    Report Status 08/11/2015 FINAL  Final  Urine culture     Status: None   Collection Time: 08/06/15  3:33 PM  Result Value Ref Range Status   Specimen Description URINE, CATHETERIZED  Final   Special Requests NONE  Final   Culture   Final    NO GROWTH 2 DAYS Performed at [facility]    Report Status 08/08/2015 FINAL  Final     Labs: Basic Metabolic Panel:  Recent Labs Lab 08/07/15 0605 08/08/15 0606 08/09/15 0509 08/11/15 0526 08/12/15 0518  NA 138 143 140 143 140  K 4.0 3.8 3.5 3.8 4.1  CL 105 110 107 107 106  CO2 24 23 25 28 26  GLUCOSE 92 98 107* 114* 103*  BUN 25* 31* 23* 16 16  CREATININE 1.02 1.13 1.22 0.98 0.96  CALCIUM 8.0* 7.9* 7.9* 8.5* 8.5*   Liver Function Tests:  Recent Labs Lab 08/06/15 1502  AST 40  ALT 20  ALKPHOS 66  BILITOT 1.1  PROT 6.9  ALBUMIN 3.5   No results for input(s): LIPASE, AMYLASE in the last 168 hours. No results for input(s): AMMONIA in the last 168 hours. CBC:  Recent Labs Lab 08/06/15 1512 08/07/15 0605 08/08/15 0606 08/09/15 0509 08/11/15 0526 08/12/15 0518  WBC 19.5* 12.9* 7.6 8.6 13.2* 12.5*  NEUTROABS 16.6*  --   --   --   --   --   HGB 11.3* 10.0* 8.8* 8.5* 8.8* 9.3*  HCT 35.3* 30.5* 27.8* 26.5* 27.2* 29.4*  MCV 71.3* 69.5* 71.8* 70.9* 68.3* 71.2*  PLT 193 151 234 267 372 452*   Cardiac Enzymes: No results for input(s): CKTOTAL, CKMB, CKMBINDEX, TROPONINI in the last 168 hours. BNP: BNP (last 3  results)  Recent Labs  11/06/14 2205  BNP 671.5*    ProBNP (last 3 results) No results for input(s): PROBNP in the last 8760 hours.  CBG: No results for input(s): GLUCAP in the last 168 hours.      

## 2015-08-12 NOTE — Care Management Note (Signed)
Case Management Note  Patient Details  Name: Douglas Mckee MRN: [id number] Date of Birth: [birth year]  Subjective/Objective:                    Action/Plan:d/c SNF   Expected Discharge Date:   (UNKNOWN)               Expected Discharge Plan:  [place]  In-House Referral:  Clinical Social Work  Discharge planning Services  CM Consult  Post Acute Care Choice:    Choice offered to:  Adult Children  DME Arranged:    DME Agency:     HH Arranged:    HH Agency:     Status of Service:  Completed, signed off  Medicare Important Message Given:  Yes Date Medicare IM Given:    Medicare IM give by:    Date Additional Medicare IM Given:    Additional Medicare Important Message give by:     If discussed at [place] of Stay Meetings, dates discussed:    Additional Comments:  Dessa Phi, RN 08/12/2015, 11:00 AM

## 2015-08-13 ENCOUNTER — Encounter: Payer: Self-pay | Admitting: Adult Health

## 2015-08-13 ENCOUNTER — Non-Acute Institutional Stay (SKILLED_NURSING_FACILITY): Payer: Medicare Other | Admitting: Adult Health

## 2015-08-13 DIAGNOSIS — N400 Enlarged prostate without lower urinary tract symptoms: Secondary | ICD-10-CM

## 2015-08-13 DIAGNOSIS — F0390 Unspecified dementia without behavioral disturbance: Secondary | ICD-10-CM

## 2015-08-13 DIAGNOSIS — J189 Pneumonia, unspecified organism: Secondary | ICD-10-CM

## 2015-08-13 DIAGNOSIS — R531 Weakness: Secondary | ICD-10-CM

## 2015-08-13 DIAGNOSIS — D72829 Elevated white blood cell count, unspecified: Secondary | ICD-10-CM | POA: Diagnosis not present

## 2015-08-13 DIAGNOSIS — D509 Iron deficiency anemia, unspecified: Secondary | ICD-10-CM | POA: Diagnosis not present

## 2015-08-13 DIAGNOSIS — J181 Lobar pneumonia, unspecified organism: Secondary | ICD-10-CM

## 2015-08-13 DIAGNOSIS — I10 Essential (primary) hypertension: Secondary | ICD-10-CM | POA: Diagnosis not present

## 2015-08-13 DIAGNOSIS — S42302S Unspecified fracture of shaft of humerus, left arm, sequela: Secondary | ICD-10-CM | POA: Diagnosis not present

## 2015-08-13 DIAGNOSIS — R1310 Dysphagia, unspecified: Secondary | ICD-10-CM | POA: Diagnosis not present

## 2015-08-13 NOTE — Progress Notes (Signed)
Patient ID: Douglas Mckee, male   DOB: [birth year], 82 y.o.   MRN: [id number]    DATE:  08/13/2015   MRN:  [id number]  BIRTHDAY: 15-Dec-1922  Facility:  [facility] Home Location:  [facility] Room Number: 606-P  LEVEL OF CARE:  SNF [phone])  Contact Information    Name Relation Home Work [place] Daughter [phone]     Delsa Sale Daughter [phone]         Code Status History    Date Active Date Inactive Code Status Order ID Comments User Context   08/06/2015  5:27 PM 08/12/2015  4:57 PM Full Code [id number]  Kelvin Cellar, MD Inpatient   10/18/2012  8:59 PM 10/20/2012  2:48 PM Full Code [id number]  Janece Canterbury, MD ED       Chief Complaint  Patient presents with  . Hospitalization Follow-up    HISTORY OF PRESENT ILLNESS:  This is a 82 year old male who has been admitted to [facility] on 08/12/15 from [facility]. He has PMH of dementia and BPH. He has been hospitalized due to pneumonia and and influenza A virus. He was given Tamiflu from 3/11 to 3/16. CXR revealed right lower lobe pneumonia and was suspected for aspiration pneumonia. He was started on Zosyn and changed to [place] on 3/15. He had a fall @ home and x-ray revealed minimal impacted comminuted fracture of the left humeral neck extending in the humeral head. Orthopedic was consulted and recommended non-operative management.  He has been admitted for a short-term rehabilitation.  PAST MEDICAL HISTORY:  Past Medical History  Diagnosis Date  . Alzheimers disease   . Gout   . CVA (cerebral infarction)   . Kidney infection   . Pneumonia   . Sepsis ([place])   . AKI (acute kidney injury) ([place])   . Left humeral fracture   . PSA elevation   . Hypertensive urgency, malignant   . UTI (lower urinary tract infection)   . Metabolic encephalopathy   . TIA (transient ischemic attack)   . Microcytic anemia   . BPH (benign prostatic hyperplasia)      CURRENT  MEDICATIONS: Reviewed  Patient's Medications  New Prescriptions   No medications on file  Previous Medications   ACETAMINOPHEN (TYLENOL) 325 MG TABLET    Take 2 tablets (650 mg total) by mouth every 6 (six) hours as needed for mild pain (or Fever >/= 101).   ALBUTEROL (PROVENTIL) (2.5 MG/3ML) 0.083% NEBULIZER SOLUTION    Take 3 mLs (2.5 mg total) by nebulization every 4 (four) hours as needed for wheezing or shortness of breath.   FISH OIL-OMEGA-3 FATTY ACIDS 1000 MG CAPSULE    Take 1 g by mouth every morning.    LEVOFLOXACIN (LEVAQUIN) 500 MG TABLET    Take 1 tablet (500 mg total) by mouth daily.   MALTODEXTRIN-XANTHAN GUM (RESOURCE THICKENUP CLEAR) POWD    Take 120 g by mouth as needed.   MULTIPLE VITAMIN (MULTIVITAMIN WITH MINERALS) TABS    Take 1 tablet by mouth every morning.    ONDANSETRON (ZOFRAN) 4 MG TABLET    Take 1 tablet (4 mg total) by mouth every 6 (six) hours as needed for nausea.   PHENYLEPH-DOXYLAMINE-DM-APAP (ALKA SELTZER PLUS PO)    Take 1 each by mouth every 6 (six) hours as needed (flu symotoms).   TAMSULOSIN (FLOMAX) 0.4 MG CAPS    Take 1 capsule (0.4 mg total) by mouth daily.  Modified Medications  No medications on file  Discontinued Medications   No medications on file     No Known Allergies   REVIEW OF SYSTEMS:  Unable to obtain due to advanced dementia  PHYSICAL EXAMINATION  GENERAL APPEARANCE: Well nourished. In no acute distress. Normal body habitus SKIN:  Skin is warm and dry. There are no suspicious lesions or rash HEAD: Normal in size and contour. No evidence of trauma EYES: Lids open and close normally. No blepharitis, entropion or ectropion. PERRL. Conjunctivae are clear and sclerae are white. Lenses are without opacity EARS: Pinnae are normal. Hard of hearing; has hearing aide on left ear MOUTH and THROAT: Lips are without lesions. Oral mucosa is moist and without lesions. Tongue is normal in shape, size, and color and without lesions NECK:  supple, trachea midline, no neck masses, no thyroid tenderness, no thyromegaly LYMPHATICS: no LAN in the neck, no supraclavicular LAN RESPIRATORY: breathing is even & unlabored, BS CTAB CARDIAC: RRR, no murmur,no extra heart sounds, no edema GI: abdomen soft, normal BS, no masses, no tenderness, no hepatomegaly, no splenomegaly EXTREMITIES:  Able to move RUE and BLE; Left arm on sling but able to move fingers PSYCHIATRIC: Alert to person only. Affect and behavior are appropriate  LABS/RADIOLOGY: Labs reviewed: Basic Metabolic Panel:  Recent Labs  08/09/15 0509 08/11/15 0526 08/12/15 0518  NA 140 143 140  K 3.5 3.8 4.1  CL 107 107 106  CO2 25 28 26   GLUCOSE 107* 114* 103*  BUN 23* 16 16  CREATININE 1.22 0.98 0.96  CALCIUM 7.9* 8.5* 8.5*   Liver Function Tests:  Recent Labs  08/06/15 1502  AST 40  ALT 20  ALKPHOS 66  BILITOT 1.1  PROT 6.9  ALBUMIN 3.5   CBC:  Recent Labs  11/06/14 2205 08/06/15 1512  08/09/15 0509 08/11/15 0526 08/12/15 0518  WBC 15.2* 19.5*  < > 8.6 13.2* 12.5*  NEUTROABS 12.3* 16.6*  --   --   --   --   HGB 11.8* 11.3*  < > 8.5* 8.8* 9.3*  HCT 35.8* 35.3*  < > 26.5* 27.2* 29.4*  MCV 70.5* 71.3*  < > 70.9* 68.3* 71.2*  PLT 328 193  < > 267 372 452*  < > = values in this interval not displayed.    Dg Chest 2 View  08/07/2015  CLINICAL DATA:  Cough and congestion. EXAM: CHEST - 2 VIEW COMPARISON:  Two-view chest x-ray 08/06/2015. FINDINGS: The heart size is exaggerated by low lung volumes. A right basilar pneumonia is again noted. The proximal left humerus fracture is again seen. IMPRESSION: 1. Persistent right basilar pneumonia. 2. Proximal left humerus fracture. Electronically Signed   By: San Morelle M.D.   On: 08/07/2015 08:21   Dg Chest 2 View  08/06/2015  CLINICAL DATA:  Fever.  Recent fall EXAM: CHEST  2 VIEW COMPARISON:  11/06/2014 FINDINGS: Right lower lobe infiltrate is new and may represent pneumonia. Small right effusion  Left lung is clear.  Negative for heart failure Fracture left humeral neck which appears acute. IMPRESSION: Right lower lobe infiltrate consistent with pneumonia Fracture left humeral neck Electronically Signed   By: Franchot Gallo M.D.   On: 08/06/2015 14:29   Ct Head Wo Contrast  08/06/2015  CLINICAL DATA:  [place] and hit head EXAM: CT HEAD WITHOUT CONTRAST TECHNIQUE: Contiguous axial images were obtained from the base of the skull through the vertex without intravenous contrast. COMPARISON:  CT head 10/18/2012 FINDINGS: Advanced atrophy. Chronic microvascular ischemic  changes in the white matter. Negative for acute infarct. Negative for acute hemorrhage or mass lesion. Atherosclerotic calcifications circle of Willis. No acute skull abnormality. Negative for fracture. IMPRESSION: Atrophy and chronic ischemia.  No acute abnormality. Electronically Signed   By: Franchot Gallo M.D.   On: 08/06/2015 15:03   Dg Chest Port 1 View  08/11/2015  CLINICAL DATA:  Pneumonia. EXAM: PORTABLE CHEST 1 VIEW COMPARISON:  08/07/2015 and 08/06/2015 FINDINGS: Exam demonstrates low lung volumes with improving mild opacification in the right base likely resolving infection or atelectasis. Minimal linear density in the left base likely atelectasis. No evidence of effusion. Cardiomediastinal silhouette is within normal. There are degenerative changes of the spine. Evidence of patient's acute left humeral neck/ head fracture. IMPRESSION: Interval improvement in minimal right base opacification likely atelectasis or infection. Subtle linear left base opacification likely atelectasis. Stable known acute left humeral neck/ head fracture. Electronically Signed   By: Marin Olp M.D.   On: 08/11/2015 11:35   Dg Humerus Left  08/06/2015  CLINICAL DATA:  Fall on left on Wednesday EXAM: LEFT HUMERUS - 2+ VIEW COMPARISON:  None. FINDINGS: Four views of the left humerus submitted. There is minimal impacted comminuted fracture of the left  humeral neck extending in humeral head. IMPRESSION: There is minimal impacted comminuted fracture of the left humeral neck extending in humeral head. Electronically Signed   By: Lahoma Crocker M.D.   On: 08/06/2015 14:28    ASSESSMENT/PLAN:  Generalized weakness - for rehabilitation  CAP - continue Levaquin 500 mg 1 tab {PO Q D X 7 days and Alka Seltzer Plus 1 Q 6 H PRN  BPH - continue Flomax 0.4 mg 1 capsule PO daily  Leukocytosis - wbc 12.5; check CBC  Dysphgia - SLP to evaluate and treat  Anemia - hgb 9.3; re-check CBC, serum iron, TIBC, B12 and folate  Left humeral neck fracture - orthopedic recommended non-operative management; continue sling  Hypertension - BP re-check 158/72; will monitor and check BP/HR BID X 1 week  Dementia - advanced; continue supportive care     Goals of care:  Short-term rehabilitation    [facility], NP [place]

## 2015-08-16 LAB — BASIC METABOLIC PANEL
BUN: 13 mg/dL (ref 4–21)
CREATININE: 1.1 mg/dL (ref 0.6–1.3)
Glucose: 97 mg/dL
POTASSIUM: 4.1 mmol/L (ref 3.4–5.3)
SODIUM: 144 mmol/L (ref 137–147)

## 2015-08-16 LAB — CBC AND DIFFERENTIAL
HCT: 34 % — AB (ref 41–53)
HEMOGLOBIN: 10.1 g/dL — AB (ref 13.5–17.5)
Neutrophils Absolute: 10 /uL
PLATELETS: 594 10*3/uL — AB (ref 150–399)
WBC: 14.4 10^3/mL

## 2015-08-17 ENCOUNTER — Non-Acute Institutional Stay (SKILLED_NURSING_FACILITY): Payer: Medicare Other | Admitting: Internal Medicine

## 2015-08-17 ENCOUNTER — Encounter: Payer: Self-pay | Admitting: Internal Medicine

## 2015-08-17 DIAGNOSIS — R5381 Other malaise: Secondary | ICD-10-CM

## 2015-08-17 DIAGNOSIS — D473 Essential (hemorrhagic) thrombocythemia: Secondary | ICD-10-CM | POA: Diagnosis not present

## 2015-08-17 DIAGNOSIS — D638 Anemia in other chronic diseases classified elsewhere: Secondary | ICD-10-CM

## 2015-08-17 DIAGNOSIS — N400 Enlarged prostate without lower urinary tract symptoms: Secondary | ICD-10-CM

## 2015-08-17 DIAGNOSIS — S42302S Unspecified fracture of shaft of humerus, left arm, sequela: Secondary | ICD-10-CM

## 2015-08-17 DIAGNOSIS — D72829 Elevated white blood cell count, unspecified: Secondary | ICD-10-CM

## 2015-08-17 DIAGNOSIS — R1310 Dysphagia, unspecified: Secondary | ICD-10-CM | POA: Diagnosis not present

## 2015-08-17 DIAGNOSIS — J101 Influenza due to other identified influenza virus with other respiratory manifestations: Secondary | ICD-10-CM

## 2015-08-17 DIAGNOSIS — F0390 Unspecified dementia without behavioral disturbance: Secondary | ICD-10-CM

## 2015-08-17 DIAGNOSIS — I10 Essential (primary) hypertension: Secondary | ICD-10-CM | POA: Diagnosis not present

## 2015-08-17 DIAGNOSIS — J690 Pneumonitis due to inhalation of food and vomit: Secondary | ICD-10-CM

## 2015-08-17 DIAGNOSIS — D75839 Thrombocytosis, unspecified: Secondary | ICD-10-CM

## 2015-08-17 NOTE — Progress Notes (Signed)
LOCATION: [facility]  PCP: Verline Lema, MD   Code Status: Full Code  Goals of care: Advanced Directive information Advanced Directives 08/06/2015  Does patient have an advance directive? Yes  Type of Advance Directive [place]  Does patient want to make changes to advanced directive? No - Patient declined  Copy of advanced directive(s) in chart? No - copy requested     Extended Emergency Contact Information Primary Emergency Contact: Farrior,Celeste Address: [address]          [place] [address] [place] Phone: [phone] Mobile Phone: [phone] Relation: Daughter Secondary Emergency Contact: Delsa Sale Address: [address]          [place], [place] [address] [place] of [place] Phone: [phone] Mobile Phone: [phone] Relation: Daughter   No Known Allergies  Chief Complaint  Patient presents with  . New Admit To SNF    New Admission     HPI:  Patient is a 82 y.o. male seen today for short term rehabilitation post hospital admission from 08/06/15-08/12/15 with sepsis in setting on influenza A and right lower lobe aspiration pneumonia. He has completed the tamiflu and is on antibiotics for pneumonia. He had AKI and received iv fluids. He also had left humeral neck fracture, was seen by orthopedics and medical management was recommended. He was seen by SLP team and is now on dysphagia diet. He has advanced dementia and is unable to participate in HPI and ROS. Per nursing staff, has been pocketing his food. No other concerns. He is a high fall risk. Per nursing staff, his BP reading has been elevated.   Review of Systems: unable to obtain.    Past Medical History  Diagnosis Date  . Alzheimers disease   . Gout   . CVA (cerebral infarction)   . Kidney infection   . Pneumonia   . Sepsis ([place])   . AKI (acute kidney injury) ([place])   . Left humeral fracture   . PSA elevation   .  Hypertensive urgency, malignant   . UTI (lower urinary tract infection)   . Metabolic encephalopathy   . TIA (transient ischemic attack)   . Microcytic anemia   . BPH (benign prostatic hyperplasia)    Past Surgical History  Procedure Laterality Date  . Cataract extraction Left 2012  . Mouth surgery  01/2012    tooth extractions and bone shaved    Social History:   reports that he has never smoked. He does not have any smokeless tobacco history on file. He reports that he does not drink alcohol or use illicit drugs.  Family History  Problem Relation Age of Onset  . High blood pressure Sister   . Diabetes Neg Hx     Medications:   Medication List       This list is accurate as of: 08/17/15  3:33 PM.  Always use your most recent med list.               acetaminophen 325 MG tablet  Commonly known as:  TYLENOL  Take 2 tablets (650 mg total) by mouth every 6 (six) hours as needed for mild pain (or Fever >/= 101).     albuterol (2.5 MG/3ML) 0.083% nebulizer solution  Commonly known as:  PROVENTIL  Take 3 mLs (2.5 mg total) by nebulization every 4 (four) hours as needed for wheezing or shortness of breath.     ALKA SELTZER PLUS PO  Take 1 each by mouth every 6 (six) hours as needed (flu symotoms). Reported on 08/17/2015     fish oil-omega-3 fatty acids 1000 MG capsule  Take 1 g by mouth every morning.     levofloxacin 500 MG tablet  Commonly known as:  LEVAQUIN  Take 1 tablet (500 mg total) by mouth daily.     multivitamin with minerals Tabs tablet  Take 1 tablet by mouth every morning.     ondansetron 4 MG tablet  Commonly known as:  ZOFRAN  Take 1 tablet (4 mg total) by mouth every 6 (six) hours as needed for nausea.     RESOURCE THICKENUP CLEAR Powd  Take 120 g by mouth as needed.     tamsulosin 0.4 MG Caps capsule  Commonly known as:  FLOMAX  Take 1 capsule (0.4 mg total) by mouth daily.        Immunizations:  There is no immunization history on file for  this patient.   Physical Exam: Filed Vitals:   08/17/15 1527  BP: 156/76  Pulse: 78  Temp: 98.1 F (36.7 C)  TempSrc: Oral  Resp: 20  Height: 5\' 11"  (1.803 m)  Weight: 167 lb 9.6 oz (76.023 kg)  SpO2: 96%   Body mass index is 23.39 kg/(m^2).  General- elderly male, frail and ill appearing, in no acute distress Head- normocephalic, atraumatic Nose- no maxillary or frontal sinus tenderness, no nasal discharge Throat- moist mucus membrane  Eyes- PERRLA, EOMI, no pallor, no icterus, no discharge, normal conjunctiva, normal sclera Neck- no cervical lymphadenopathy Cardiovascular- normal s1,s2, no murmur, trace leg edema Respiratory- bilateral clear to auscultation, no wheeze, no rhonchi, no crackles, no use of accessory muscles Abdomen- bowel sounds present, soft, non tender Musculoskeletal- able to move all 4 extremities, generalized weakness, sling to left forearm Neurological- unable to assess Skin- warm and dry Psychiatry- calm this visit    Labs reviewed: Basic Metabolic Panel:  Recent Labs  08/09/15 0509 08/11/15 0526 08/12/15 08/12/15 0518  NA 140 143 140 140  K 3.5 3.8  --  4.1  CL 107 107  --  106  CO2 25 28  --  26  GLUCOSE 107* 114*  --  103*  BUN 23* 16 16 16   CREATININE 1.22 0.98 1.0 0.96  CALCIUM 7.9* 8.5*  --  8.5*   Liver Function Tests:  Recent Labs  08/06/15 1502  AST 40  ALT 20  ALKPHOS 66  BILITOT 1.1  PROT 6.9  ALBUMIN 3.5   No results for input(s): LIPASE, AMYLASE in the last 8760 hours. No results for input(s): AMMONIA in the last 8760 hours. CBC:  Recent Labs  11/06/14 2205 08/06/15 1512  08/09/15 0509 08/11/15 0526 08/12/15 08/12/15 0518  WBC 15.2* 19.5*  < > 8.6 13.2* 12.5 12.5*  NEUTROABS 12.3* 16.6*  --   --   --   --   --   HGB 11.8* 11.3*  < > 8.5* 8.8*  --  9.3*  HCT 35.8* 35.3*  < > 26.5* 27.2*  --  29.4*  MCV 70.5* 71.3*  < > 70.9* 68.3*  --  71.2*  PLT 328 193  < > 267 372  --  452*  < > = values in this  interval not displayed.   Radiological Exams: Dg Chest 2 View  08/07/2015  CLINICAL DATA:  Cough and congestion. EXAM: CHEST - 2 VIEW COMPARISON:  Two-view chest x-ray 08/06/2015. FINDINGS: The heart size is exaggerated by low lung  volumes. A right basilar pneumonia is again noted. The proximal left humerus fracture is again seen. IMPRESSION: 1. Persistent right basilar pneumonia. 2. Proximal left humerus fracture. Electronically Signed   By: San Morelle M.D.   On: 08/07/2015 08:21   Dg Chest 2 View  08/06/2015  CLINICAL DATA:  Fever.  Recent fall EXAM: CHEST  2 VIEW COMPARISON:  11/06/2014 FINDINGS: Right lower lobe infiltrate is new and may represent pneumonia. Small right effusion Left lung is clear.  Negative for heart failure Fracture left humeral neck which appears acute. IMPRESSION: Right lower lobe infiltrate consistent with pneumonia Fracture left humeral neck Electronically Signed   By: Franchot Gallo M.D.   On: 08/06/2015 14:29   Ct Head Wo Contrast  08/06/2015  CLINICAL DATA:  [place] and hit head EXAM: CT HEAD WITHOUT CONTRAST TECHNIQUE: Contiguous axial images were obtained from the base of the skull through the vertex without intravenous contrast. COMPARISON:  CT head 10/18/2012 FINDINGS: Advanced atrophy. Chronic microvascular ischemic changes in the white matter. Negative for acute infarct. Negative for acute hemorrhage or mass lesion. Atherosclerotic calcifications circle of Willis. No acute skull abnormality. Negative for fracture. IMPRESSION: Atrophy and chronic ischemia.  No acute abnormality. Electronically Signed   By: Franchot Gallo M.D.   On: 08/06/2015 15:03   Dg Chest Port 1 View  08/11/2015  CLINICAL DATA:  Pneumonia. EXAM: PORTABLE CHEST 1 VIEW COMPARISON:  08/07/2015 and 08/06/2015 FINDINGS: Exam demonstrates low lung volumes with improving mild opacification in the right base likely resolving infection or atelectasis. Minimal linear density in the left base likely  atelectasis. No evidence of effusion. Cardiomediastinal silhouette is within normal. There are degenerative changes of the spine. Evidence of patient's acute left humeral neck/ head fracture. IMPRESSION: Interval improvement in minimal right base opacification likely atelectasis or infection. Subtle linear left base opacification likely atelectasis. Stable known acute left humeral neck/ head fracture. Electronically Signed   By: Marin Olp M.D.   On: 08/11/2015 11:35   Dg Humerus Left  08/06/2015  CLINICAL DATA:  Fall on left on Wednesday EXAM: LEFT HUMERUS - 2+ VIEW COMPARISON:  None. FINDINGS: Four views of the left humerus submitted. There is minimal impacted comminuted fracture of the left humeral neck extending in humeral head. IMPRESSION: There is minimal impacted comminuted fracture of the left humeral neck extending in humeral head. Electronically Signed   By: Lahoma Crocker M.D.   On: 08/06/2015 14:28    Assessment/Plan  Physical deconditioning Will have him work with physical therapy and occupational therapy team to help with gait training and muscle strengthening exercises.fall precautions. Skin care. Encourage to be out of bed.   Influenza A Completed medications. Clinically breathing is stable, monitor  Aspiration pneumonia Continue levaquin 500 mg daily until 07/22/15. Aspiration precautions. To work with SLP team  Dysphagia Aspiration precautions, dysphagia diet  Leukocytosis Afebrile, wbc 14.4 on review. Send u/a with c/s and a cxr PA/ lat to evaluate further  Left humeral neck fracture continue sling to left arm and tylenol as needed for pain  Microcytic anemia Hb low. Iron panel is suggestive of anemia of chronic disease. Start ferrous sulfate 325 mg po daily and check cbc in 2 week  HTN Elevated bp reading. Family has refused medication for HTN. Monitor bp bid for now  BPH  continue Flomax 0.4 mg daily  Dementia  Advanced., continue supportive care, assistance  with ADLs, fall precautions and pressure ulcer prophylaxis  Thrombocytosis Likely reactive thrombocytosis, monitor platelet count  Goals of care: short term rehabilitation and possible LTC   Labs/tests ordered: cbc with diff3/23/17. Cbc in 2 weeks  Family/ staff Communication: reviewed care plan with patient and nursing supervisor    Blanchie Serve, MD Internal Medicine [place], [place] [address] Cell Phone (Monday-Friday 8 am - 5 pm): [phone] On Call: [phone] and follow prompts after 5 pm and on weekends Office Phone: [phone] Office Fax: [phone]

## 2015-08-19 LAB — BASIC METABOLIC PANEL
BUN: 15 mg/dL (ref 4–21)
CREATININE: 1.1 mg/dL (ref 0.6–1.3)
GLUCOSE: 84 mg/dL
POTASSIUM: 4.4 mmol/L (ref 3.4–5.3)
SODIUM: 141 mmol/L (ref 137–147)

## 2015-08-23 LAB — CBC AND DIFFERENTIAL
HCT: 33 % — AB (ref 41–53)
Hemoglobin: 10.2 g/dL — AB (ref 13.5–17.5)
Neutrophils Absolute: 6 /uL
Platelets: 451 10*3/uL — AB (ref 150–399)
WBC: 9.3 10^3/mL

## 2015-08-28 DIAGNOSIS — R2681 Unsteadiness on feet: Secondary | ICD-10-CM | POA: Diagnosis not present

## 2015-08-28 DIAGNOSIS — M6281 Muscle weakness (generalized): Secondary | ICD-10-CM | POA: Diagnosis not present

## 2015-08-28 DIAGNOSIS — N179 Acute kidney failure, unspecified: Secondary | ICD-10-CM | POA: Diagnosis not present

## 2015-08-28 DIAGNOSIS — F0390 Unspecified dementia without behavioral disturbance: Secondary | ICD-10-CM | POA: Diagnosis not present

## 2015-08-28 DIAGNOSIS — N400 Enlarged prostate without lower urinary tract symptoms: Secondary | ICD-10-CM | POA: Diagnosis not present

## 2015-08-28 DIAGNOSIS — R1312 Dysphagia, oropharyngeal phase: Secondary | ICD-10-CM | POA: Diagnosis not present

## 2015-08-28 DIAGNOSIS — R531 Weakness: Secondary | ICD-10-CM | POA: Diagnosis not present

## 2015-08-28 DIAGNOSIS — S42302D Unspecified fracture of shaft of humerus, left arm, subsequent encounter for fracture with routine healing: Secondary | ICD-10-CM | POA: Diagnosis not present

## 2015-08-28 DIAGNOSIS — R488 Other symbolic dysfunctions: Secondary | ICD-10-CM | POA: Diagnosis not present

## 2015-08-28 DIAGNOSIS — N401 Enlarged prostate with lower urinary tract symptoms: Secondary | ICD-10-CM | POA: Diagnosis not present

## 2015-08-28 DIAGNOSIS — Z8673 Personal history of transient ischemic attack (TIA), and cerebral infarction without residual deficits: Secondary | ICD-10-CM | POA: Diagnosis not present

## 2015-08-28 DIAGNOSIS — J690 Pneumonitis due to inhalation of food and vomit: Secondary | ICD-10-CM | POA: Diagnosis not present

## 2015-08-28 DIAGNOSIS — J189 Pneumonia, unspecified organism: Secondary | ICD-10-CM | POA: Diagnosis not present

## 2015-08-28 DIAGNOSIS — D638 Anemia in other chronic diseases classified elsewhere: Secondary | ICD-10-CM | POA: Diagnosis not present

## 2015-08-28 DIAGNOSIS — A419 Sepsis, unspecified organism: Secondary | ICD-10-CM | POA: Diagnosis not present

## 2015-08-28 DIAGNOSIS — M79602 Pain in left arm: Secondary | ICD-10-CM | POA: Diagnosis not present

## 2015-08-28 DIAGNOSIS — I10 Essential (primary) hypertension: Secondary | ICD-10-CM | POA: Diagnosis not present

## 2015-08-28 DIAGNOSIS — Z4789 Encounter for other orthopedic aftercare: Secondary | ICD-10-CM | POA: Diagnosis not present

## 2015-08-28 DIAGNOSIS — S42302A Unspecified fracture of shaft of humerus, left arm, initial encounter for closed fracture: Secondary | ICD-10-CM | POA: Diagnosis not present

## 2015-08-28 DIAGNOSIS — S42302S Unspecified fracture of shaft of humerus, left arm, sequela: Secondary | ICD-10-CM | POA: Diagnosis not present

## 2015-08-28 DIAGNOSIS — G9340 Encephalopathy, unspecified: Secondary | ICD-10-CM | POA: Diagnosis not present

## 2015-08-31 ENCOUNTER — Encounter: Payer: Self-pay | Admitting: Adult Health

## 2015-08-31 ENCOUNTER — Non-Acute Institutional Stay (SKILLED_NURSING_FACILITY): Payer: Medicare Other | Admitting: Adult Health

## 2015-08-31 DIAGNOSIS — I10 Essential (primary) hypertension: Secondary | ICD-10-CM | POA: Diagnosis not present

## 2015-08-31 DIAGNOSIS — J690 Pneumonitis due to inhalation of food and vomit: Secondary | ICD-10-CM | POA: Diagnosis not present

## 2015-08-31 DIAGNOSIS — F0390 Unspecified dementia without behavioral disturbance: Secondary | ICD-10-CM

## 2015-08-31 DIAGNOSIS — S42302S Unspecified fracture of shaft of humerus, left arm, sequela: Secondary | ICD-10-CM | POA: Diagnosis not present

## 2015-08-31 DIAGNOSIS — N400 Enlarged prostate without lower urinary tract symptoms: Secondary | ICD-10-CM | POA: Diagnosis not present

## 2015-08-31 DIAGNOSIS — R531 Weakness: Secondary | ICD-10-CM | POA: Diagnosis not present

## 2015-08-31 DIAGNOSIS — D638 Anemia in other chronic diseases classified elsewhere: Secondary | ICD-10-CM | POA: Diagnosis not present

## 2015-08-31 NOTE — Progress Notes (Signed)
Patient ID: Douglas Mckee, male   DOB: 1922/11/10, 82 y.o.   MRN: [id number]    DATE:  08/31/15   MRN:  [id number]  BIRTHDAY: 11-09-1922  Facility:  Nursing Home Location:  [facility] Room Number: 606-P  LEVEL OF CARE:  SNF [phone])  Contact Information    Name Relation Home Work [place] Daughter [phone]  [phone]   Delsa Sale Daughter [phone]  [phone]       Code Status History    Date Active Date Inactive Code Status Order ID Comments User Context   08/06/2015  5:27 PM 08/12/2015  4:57 PM Full Code [id number]  Kelvin Cellar, MD Inpatient   10/18/2012  8:59 PM 10/20/2012  2:48 PM Full Code [id number]  Janece Canterbury, MD ED       Chief Complaint  Patient presents with  . Discharge Note    HISTORY OF PRESENT ILLNESS:  This is a 82 year old male who is for discharge home with Home health PT foe endurance, OT for ADLs, CNA for showers and Skilled Nurse for wound care. DME:  18" X 18" standard wheelchair with gel cushion.  He has been admitted to [facility] on 08/12/15 from [facility]. He has PMH of dementia and BPH. He has been hospitalized due to pneumonia and and influenza A virus. He was given Tamiflu from 3/11 to 3/16. CXR revealed right lower lobe pneumonia and was suspected for aspiration pneumonia. He was started on Zosyn and changed to [place] on 3/15. He had a fall @ home and x-ray revealed minimal impacted comminuted fracture of the left humeral neck extending in the humeral head. Orthopedic was consulted and recommended non-operative management.  Patient was admitted to this facility for short-term rehabilitation after the patient's recent hospitalization.  Patient has completed SNF rehabilitation and therapy has cleared the patient for discharge.  PAST MEDICAL HISTORY:  Past Medical History:  Diagnosis Date  . AKI (acute kidney injury) ([place])   . Alzheimers disease   . Anemia of chronic disease    . Aspiration pneumonia of right lower lobe ([place])   . BPH (benign prostatic hyperplasia)   . CVA (cerebral infarction)   . Dysphagia   . Gout   . Hypertensive urgency, malignant   . Influenza A   . Kidney infection   . Left humeral fracture   . Leukocytosis   . Metabolic encephalopathy   . Microcytic anemia   . Physical deconditioning   . Pneumonia   . PSA elevation   . Sepsis ([place])   . Thrombocytosis ([place])   . TIA (transient ischemic attack)   . UTI (lower urinary tract infection)      CURRENT MEDICATIONS: Reviewed  Patient's Medications  New Prescriptions   ALBUTEROL (PROVENTIL HFA;VENTOLIN HFA) 108 (90 BASE) MCG/ACT INHALER    Inhale 2 puffs into the lungs every 4 (four) hours as needed for wheezing or shortness of breath (or cough).  Previous Medications   ACETAMINOPHEN (TYLENOL) 325 MG TABLET    Take 2 tablets (650 mg total) by mouth every 6 (six) hours as needed for mild pain (or Fever >/= 101).   FERROUS SULFATE 325 (65 FE) MG TABLET    Take 325 mg by mouth daily with breakfast.   FISH OIL-OMEGA-3 FATTY ACIDS 1000 MG CAPSULE    Take 1 g by mouth every morning.    MULTIPLE VITAMIN (MULTIVITAMIN WITH MINERALS) TABS    Take 1 tablet by mouth every morning.  ONDANSETRON (ZOFRAN) 4 MG TABLET    Take 1 tablet (4 mg total) by mouth every 6 (six) hours as needed for nausea.   PHENYLEPH-DOXYLAMINE-DM-APAP (ALKA SELTZER PLUS PO)    Take 1 each by mouth every 6 (six) hours as needed (flu symotoms). Reported on 08/17/2015   TAMSULOSIN (FLOMAX) 0.4 MG CAPS    Take 1 capsule (0.4 mg total) by mouth daily.  Modified Medications   No medications on file  Discontinued Medications   ALBUTEROL (PROVENTIL) (2.5 MG/3ML) 0.083% NEBULIZER SOLUTION    Take 3 mLs (2.5 mg total) by nebulization every 4 (four) hours as needed for wheezing or shortness of breath.   LEVOFLOXACIN (LEVAQUIN) 500 MG TABLET    Take 1 tablet (500 mg total) by mouth daily.   MALTODEXTRIN-XANTHAN GUM (RESOURCE THICKENUP  CLEAR) POWD    Take 120 g by mouth as needed.     No Known Allergies   REVIEW OF SYSTEMS:  Unable to obtain due to advanced dementia  PHYSICAL EXAMINATION  GENERAL APPEARANCE: Well nourished. In no acute distress. Normal body habitus SKIN:  Skin is warm and dry. There are no suspicious lesions or rash HEAD: Normal in size and contour. No evidence of trauma EYES: Lids open and close normally. No blepharitis, entropion or ectropion. PERRL. Conjunctivae are clear and sclerae are white. Lenses are without opacity EARS: Pinnae are normal. Hard of hearing; has hearing aide on left ear MOUTH and THROAT: Lips are without lesions. Oral mucosa is moist and without lesions. Tongue is normal in shape, size, and color and without lesions NECK: supple, trachea midline, no neck masses, no thyroid tenderness, no thyromegaly LYMPHATICS: no LAN in the neck, no supraclavicular LAN RESPIRATORY: breathing is even & unlabored, BS CTAB CARDIAC: RRR, no murmur,no extra heart sounds, no edema GI: abdomen soft, normal BS, no masses, no tenderness, no hepatomegaly, no splenomegaly EXTREMITIES:  Able to move RUE and BLE; Left arm on sling but able to move fingers PSYCHIATRIC: Alert to person only. Affect and behavior are appropriate  LABS/RADIOLOGY: Labs reviewed: Basic Metabolic Panel:  Recent Labs  08/11/15 0526  08/12/15 0518 08/16/15 08/19/15   NA 143  < > 140 144 141   K 3.8  --  4.1 4.1 4.4   CL 107  --  106  --   --    CO2 28  --  26  --   --    GLUCOSE 114*  --  103*  --   --    BUN 16  < > 16 13 15    CREATININE 0.98  < > 0.96 1.1 1.1   CALCIUM 8.5*  --  8.5*  --   --    < > = values in this interval not displayed. Liver Function Tests:  Recent Labs  08/06/15 1502  AST 40  ALT 20  ALKPHOS 66  BILITOT 1.1  PROT 6.9  ALBUMIN 3.5   CBC:   Recent Labs  08/06/15 1512  08/11/15 0526  08/12/15 0518 08/16/15 08/23/15   WBC 19.5*  < > 13.2*  < > 12.5* 14.4 9.3   NEUTROABS 16.6*  --    --   --   --  10 6   HGB 11.3*  < > 8.8*  --  9.3* 10.1* 10.2*   HCT 35.3*  < > 27.2*  --  29.4* 34* 33*   MCV 71.3*  < > 68.3*  --  71.2*  --   --  PLT 193  < > 372  --  452* 594* 451*   < > = values in this interval not displayed.    ASSESSMENT/PLAN:  Generalized weakness - for Home health PT, OT, CNA and Skilled Nurse; for therapeutic strengthening exercises; fall precaution  CAP - finished Levaquin course; resolved  BPH - continue Flomax 0.4 mg 1 capsule PO daily  Leukocytosis - wbc 12.5; re-check wbc 9.3, resolved  Anemia - hgb 9.3; re-check hgb 10.2, recently started on FeSO4  325 mg 1 tab PO daily  Left humeral neck fracture - orthopedic recommended non-operative management; continue sling; continue Acetaminophen 325 mg 2 tabs =650 mg PO Q 6 hours PRN for pain  Hypertension - stable; not on any medication  Dementia - advanced; continue supportive care; fall precaution      I have filled out patient's discharge paperwork and written prescriptions.  Patient will receive home health PT, OT, Skilled Nurse and CNA.  DME provided:  18" X 18" standard wheelchair with gel cushion  Total discharge time: Greater than 30 minutes Greater than 50% was spent in counseling and coordination of care with the patient.   Discharge time involved coordination of the discharge process with social worker, nursing staff and therapy department. Medical justification for home health services/DME verified.    Durenda Age, NP [employer] [phone]

## 2015-09-01 DIAGNOSIS — S42302A Unspecified fracture of shaft of humerus, left arm, initial encounter for closed fracture: Secondary | ICD-10-CM | POA: Diagnosis not present

## 2015-09-08 DIAGNOSIS — G309 Alzheimer's disease, unspecified: Secondary | ICD-10-CM | POA: Diagnosis not present

## 2015-09-08 DIAGNOSIS — R1310 Dysphagia, unspecified: Secondary | ICD-10-CM | POA: Diagnosis not present

## 2015-09-08 DIAGNOSIS — D509 Iron deficiency anemia, unspecified: Secondary | ICD-10-CM | POA: Diagnosis not present

## 2015-09-08 DIAGNOSIS — Z8701 Personal history of pneumonia (recurrent): Secondary | ICD-10-CM | POA: Diagnosis not present

## 2015-09-08 DIAGNOSIS — R269 Unspecified abnormalities of gait and mobility: Secondary | ICD-10-CM | POA: Diagnosis not present

## 2015-09-08 DIAGNOSIS — I10 Essential (primary) hypertension: Secondary | ICD-10-CM | POA: Diagnosis not present

## 2015-09-08 DIAGNOSIS — F0280 Dementia in other diseases classified elsewhere without behavioral disturbance: Secondary | ICD-10-CM | POA: Diagnosis not present

## 2015-09-08 DIAGNOSIS — N400 Enlarged prostate without lower urinary tract symptoms: Secondary | ICD-10-CM | POA: Diagnosis not present

## 2015-09-08 DIAGNOSIS — M6281 Muscle weakness (generalized): Secondary | ICD-10-CM | POA: Diagnosis not present

## 2015-09-10 DIAGNOSIS — F0280 Dementia in other diseases classified elsewhere without behavioral disturbance: Secondary | ICD-10-CM | POA: Diagnosis not present

## 2015-09-10 DIAGNOSIS — R1310 Dysphagia, unspecified: Secondary | ICD-10-CM | POA: Diagnosis not present

## 2015-09-10 DIAGNOSIS — M6281 Muscle weakness (generalized): Secondary | ICD-10-CM | POA: Diagnosis not present

## 2015-09-10 DIAGNOSIS — G309 Alzheimer's disease, unspecified: Secondary | ICD-10-CM | POA: Diagnosis not present

## 2015-09-10 DIAGNOSIS — D509 Iron deficiency anemia, unspecified: Secondary | ICD-10-CM | POA: Diagnosis not present

## 2015-09-10 DIAGNOSIS — R269 Unspecified abnormalities of gait and mobility: Secondary | ICD-10-CM | POA: Diagnosis not present

## 2015-09-11 DIAGNOSIS — M6281 Muscle weakness (generalized): Secondary | ICD-10-CM | POA: Diagnosis not present

## 2015-09-11 DIAGNOSIS — R1310 Dysphagia, unspecified: Secondary | ICD-10-CM | POA: Diagnosis not present

## 2015-09-11 DIAGNOSIS — G309 Alzheimer's disease, unspecified: Secondary | ICD-10-CM | POA: Diagnosis not present

## 2015-09-11 DIAGNOSIS — D509 Iron deficiency anemia, unspecified: Secondary | ICD-10-CM | POA: Diagnosis not present

## 2015-09-11 DIAGNOSIS — F0280 Dementia in other diseases classified elsewhere without behavioral disturbance: Secondary | ICD-10-CM | POA: Diagnosis not present

## 2015-09-11 DIAGNOSIS — R269 Unspecified abnormalities of gait and mobility: Secondary | ICD-10-CM | POA: Diagnosis not present

## 2015-09-13 DIAGNOSIS — R269 Unspecified abnormalities of gait and mobility: Secondary | ICD-10-CM | POA: Diagnosis not present

## 2015-09-13 DIAGNOSIS — R1310 Dysphagia, unspecified: Secondary | ICD-10-CM | POA: Diagnosis not present

## 2015-09-13 DIAGNOSIS — G309 Alzheimer's disease, unspecified: Secondary | ICD-10-CM | POA: Diagnosis not present

## 2015-09-13 DIAGNOSIS — D509 Iron deficiency anemia, unspecified: Secondary | ICD-10-CM | POA: Diagnosis not present

## 2015-09-13 DIAGNOSIS — F0280 Dementia in other diseases classified elsewhere without behavioral disturbance: Secondary | ICD-10-CM | POA: Diagnosis not present

## 2015-09-13 DIAGNOSIS — M6281 Muscle weakness (generalized): Secondary | ICD-10-CM | POA: Diagnosis not present

## 2015-09-15 DIAGNOSIS — R269 Unspecified abnormalities of gait and mobility: Secondary | ICD-10-CM | POA: Diagnosis not present

## 2015-09-15 DIAGNOSIS — G309 Alzheimer's disease, unspecified: Secondary | ICD-10-CM | POA: Diagnosis not present

## 2015-09-15 DIAGNOSIS — M6281 Muscle weakness (generalized): Secondary | ICD-10-CM | POA: Diagnosis not present

## 2015-09-15 DIAGNOSIS — D509 Iron deficiency anemia, unspecified: Secondary | ICD-10-CM | POA: Diagnosis not present

## 2015-09-15 DIAGNOSIS — R1310 Dysphagia, unspecified: Secondary | ICD-10-CM | POA: Diagnosis not present

## 2015-09-15 DIAGNOSIS — F0280 Dementia in other diseases classified elsewhere without behavioral disturbance: Secondary | ICD-10-CM | POA: Diagnosis not present

## 2015-09-16 DIAGNOSIS — R269 Unspecified abnormalities of gait and mobility: Secondary | ICD-10-CM | POA: Diagnosis not present

## 2015-09-16 DIAGNOSIS — D509 Iron deficiency anemia, unspecified: Secondary | ICD-10-CM | POA: Diagnosis not present

## 2015-09-16 DIAGNOSIS — G309 Alzheimer's disease, unspecified: Secondary | ICD-10-CM | POA: Diagnosis not present

## 2015-09-16 DIAGNOSIS — M6281 Muscle weakness (generalized): Secondary | ICD-10-CM | POA: Diagnosis not present

## 2015-09-16 DIAGNOSIS — F0280 Dementia in other diseases classified elsewhere without behavioral disturbance: Secondary | ICD-10-CM | POA: Diagnosis not present

## 2015-09-16 DIAGNOSIS — R1310 Dysphagia, unspecified: Secondary | ICD-10-CM | POA: Diagnosis not present

## 2015-09-17 DIAGNOSIS — F0280 Dementia in other diseases classified elsewhere without behavioral disturbance: Secondary | ICD-10-CM | POA: Diagnosis not present

## 2015-09-17 DIAGNOSIS — D509 Iron deficiency anemia, unspecified: Secondary | ICD-10-CM | POA: Diagnosis not present

## 2015-09-17 DIAGNOSIS — M6281 Muscle weakness (generalized): Secondary | ICD-10-CM | POA: Diagnosis not present

## 2015-09-17 DIAGNOSIS — R1310 Dysphagia, unspecified: Secondary | ICD-10-CM | POA: Diagnosis not present

## 2015-09-17 DIAGNOSIS — R269 Unspecified abnormalities of gait and mobility: Secondary | ICD-10-CM | POA: Diagnosis not present

## 2015-09-17 DIAGNOSIS — G309 Alzheimer's disease, unspecified: Secondary | ICD-10-CM | POA: Diagnosis not present

## 2015-09-20 DIAGNOSIS — R1310 Dysphagia, unspecified: Secondary | ICD-10-CM | POA: Diagnosis not present

## 2015-09-20 DIAGNOSIS — D509 Iron deficiency anemia, unspecified: Secondary | ICD-10-CM | POA: Diagnosis not present

## 2015-09-20 DIAGNOSIS — R269 Unspecified abnormalities of gait and mobility: Secondary | ICD-10-CM | POA: Diagnosis not present

## 2015-09-20 DIAGNOSIS — M6281 Muscle weakness (generalized): Secondary | ICD-10-CM | POA: Diagnosis not present

## 2015-09-20 DIAGNOSIS — F0280 Dementia in other diseases classified elsewhere without behavioral disturbance: Secondary | ICD-10-CM | POA: Diagnosis not present

## 2015-09-20 DIAGNOSIS — G309 Alzheimer's disease, unspecified: Secondary | ICD-10-CM | POA: Diagnosis not present

## 2015-09-21 DIAGNOSIS — R1310 Dysphagia, unspecified: Secondary | ICD-10-CM | POA: Diagnosis not present

## 2015-09-21 DIAGNOSIS — G309 Alzheimer's disease, unspecified: Secondary | ICD-10-CM | POA: Diagnosis not present

## 2015-09-21 DIAGNOSIS — R269 Unspecified abnormalities of gait and mobility: Secondary | ICD-10-CM | POA: Diagnosis not present

## 2015-09-21 DIAGNOSIS — F0280 Dementia in other diseases classified elsewhere without behavioral disturbance: Secondary | ICD-10-CM | POA: Diagnosis not present

## 2015-09-21 DIAGNOSIS — D509 Iron deficiency anemia, unspecified: Secondary | ICD-10-CM | POA: Diagnosis not present

## 2015-09-21 DIAGNOSIS — M6281 Muscle weakness (generalized): Secondary | ICD-10-CM | POA: Diagnosis not present

## 2015-09-22 DIAGNOSIS — M25512 Pain in left shoulder: Secondary | ICD-10-CM | POA: Diagnosis not present

## 2015-09-22 DIAGNOSIS — S42302D Unspecified fracture of shaft of humerus, left arm, subsequent encounter for fracture with routine healing: Secondary | ICD-10-CM | POA: Diagnosis not present

## 2015-09-23 DIAGNOSIS — R269 Unspecified abnormalities of gait and mobility: Secondary | ICD-10-CM | POA: Diagnosis not present

## 2015-09-23 DIAGNOSIS — D509 Iron deficiency anemia, unspecified: Secondary | ICD-10-CM | POA: Diagnosis not present

## 2015-09-23 DIAGNOSIS — R1310 Dysphagia, unspecified: Secondary | ICD-10-CM | POA: Diagnosis not present

## 2015-09-23 DIAGNOSIS — G309 Alzheimer's disease, unspecified: Secondary | ICD-10-CM | POA: Diagnosis not present

## 2015-09-23 DIAGNOSIS — F0280 Dementia in other diseases classified elsewhere without behavioral disturbance: Secondary | ICD-10-CM | POA: Diagnosis not present

## 2015-09-23 DIAGNOSIS — M6281 Muscle weakness (generalized): Secondary | ICD-10-CM | POA: Diagnosis not present

## 2015-09-24 DIAGNOSIS — G309 Alzheimer's disease, unspecified: Secondary | ICD-10-CM | POA: Diagnosis not present

## 2015-09-24 DIAGNOSIS — M6281 Muscle weakness (generalized): Secondary | ICD-10-CM | POA: Diagnosis not present

## 2015-09-24 DIAGNOSIS — D509 Iron deficiency anemia, unspecified: Secondary | ICD-10-CM | POA: Diagnosis not present

## 2015-09-24 DIAGNOSIS — R269 Unspecified abnormalities of gait and mobility: Secondary | ICD-10-CM | POA: Diagnosis not present

## 2015-09-24 DIAGNOSIS — R1310 Dysphagia, unspecified: Secondary | ICD-10-CM | POA: Diagnosis not present

## 2015-09-24 DIAGNOSIS — F0280 Dementia in other diseases classified elsewhere without behavioral disturbance: Secondary | ICD-10-CM | POA: Diagnosis not present

## 2015-09-28 DIAGNOSIS — R269 Unspecified abnormalities of gait and mobility: Secondary | ICD-10-CM | POA: Diagnosis not present

## 2015-09-28 DIAGNOSIS — D509 Iron deficiency anemia, unspecified: Secondary | ICD-10-CM | POA: Diagnosis not present

## 2015-09-28 DIAGNOSIS — R1310 Dysphagia, unspecified: Secondary | ICD-10-CM | POA: Diagnosis not present

## 2015-09-28 DIAGNOSIS — M6281 Muscle weakness (generalized): Secondary | ICD-10-CM | POA: Diagnosis not present

## 2015-09-28 DIAGNOSIS — G309 Alzheimer's disease, unspecified: Secondary | ICD-10-CM | POA: Diagnosis not present

## 2015-09-28 DIAGNOSIS — F0280 Dementia in other diseases classified elsewhere without behavioral disturbance: Secondary | ICD-10-CM | POA: Diagnosis not present

## 2015-09-29 DIAGNOSIS — F0280 Dementia in other diseases classified elsewhere without behavioral disturbance: Secondary | ICD-10-CM | POA: Diagnosis not present

## 2015-09-29 DIAGNOSIS — D509 Iron deficiency anemia, unspecified: Secondary | ICD-10-CM | POA: Diagnosis not present

## 2015-09-29 DIAGNOSIS — R1310 Dysphagia, unspecified: Secondary | ICD-10-CM | POA: Diagnosis not present

## 2015-09-29 DIAGNOSIS — G309 Alzheimer's disease, unspecified: Secondary | ICD-10-CM | POA: Diagnosis not present

## 2015-09-29 DIAGNOSIS — M6281 Muscle weakness (generalized): Secondary | ICD-10-CM | POA: Diagnosis not present

## 2015-09-29 DIAGNOSIS — R269 Unspecified abnormalities of gait and mobility: Secondary | ICD-10-CM | POA: Diagnosis not present

## 2015-09-30 DIAGNOSIS — D509 Iron deficiency anemia, unspecified: Secondary | ICD-10-CM | POA: Diagnosis not present

## 2015-09-30 DIAGNOSIS — R269 Unspecified abnormalities of gait and mobility: Secondary | ICD-10-CM | POA: Diagnosis not present

## 2015-09-30 DIAGNOSIS — R1310 Dysphagia, unspecified: Secondary | ICD-10-CM | POA: Diagnosis not present

## 2015-09-30 DIAGNOSIS — F0280 Dementia in other diseases classified elsewhere without behavioral disturbance: Secondary | ICD-10-CM | POA: Diagnosis not present

## 2015-09-30 DIAGNOSIS — G309 Alzheimer's disease, unspecified: Secondary | ICD-10-CM | POA: Diagnosis not present

## 2015-09-30 DIAGNOSIS — M6281 Muscle weakness (generalized): Secondary | ICD-10-CM | POA: Diagnosis not present

## 2015-10-01 DIAGNOSIS — R269 Unspecified abnormalities of gait and mobility: Secondary | ICD-10-CM | POA: Diagnosis not present

## 2015-10-01 DIAGNOSIS — F0280 Dementia in other diseases classified elsewhere without behavioral disturbance: Secondary | ICD-10-CM | POA: Diagnosis not present

## 2015-10-01 DIAGNOSIS — R1310 Dysphagia, unspecified: Secondary | ICD-10-CM | POA: Diagnosis not present

## 2015-10-01 DIAGNOSIS — M6281 Muscle weakness (generalized): Secondary | ICD-10-CM | POA: Diagnosis not present

## 2015-10-01 DIAGNOSIS — G309 Alzheimer's disease, unspecified: Secondary | ICD-10-CM | POA: Diagnosis not present

## 2015-10-01 DIAGNOSIS — D509 Iron deficiency anemia, unspecified: Secondary | ICD-10-CM | POA: Diagnosis not present

## 2015-10-05 DIAGNOSIS — R269 Unspecified abnormalities of gait and mobility: Secondary | ICD-10-CM | POA: Diagnosis not present

## 2015-10-05 DIAGNOSIS — M6281 Muscle weakness (generalized): Secondary | ICD-10-CM | POA: Diagnosis not present

## 2015-10-05 DIAGNOSIS — R1310 Dysphagia, unspecified: Secondary | ICD-10-CM | POA: Diagnosis not present

## 2015-10-05 DIAGNOSIS — F0280 Dementia in other diseases classified elsewhere without behavioral disturbance: Secondary | ICD-10-CM | POA: Diagnosis not present

## 2015-10-05 DIAGNOSIS — D509 Iron deficiency anemia, unspecified: Secondary | ICD-10-CM | POA: Diagnosis not present

## 2015-10-05 DIAGNOSIS — G309 Alzheimer's disease, unspecified: Secondary | ICD-10-CM | POA: Diagnosis not present

## 2015-10-06 DIAGNOSIS — M6281 Muscle weakness (generalized): Secondary | ICD-10-CM | POA: Diagnosis not present

## 2015-10-06 DIAGNOSIS — F0280 Dementia in other diseases classified elsewhere without behavioral disturbance: Secondary | ICD-10-CM | POA: Diagnosis not present

## 2015-10-06 DIAGNOSIS — G309 Alzheimer's disease, unspecified: Secondary | ICD-10-CM | POA: Diagnosis not present

## 2015-10-06 DIAGNOSIS — R269 Unspecified abnormalities of gait and mobility: Secondary | ICD-10-CM | POA: Diagnosis not present

## 2015-10-06 DIAGNOSIS — R1310 Dysphagia, unspecified: Secondary | ICD-10-CM | POA: Diagnosis not present

## 2015-10-06 DIAGNOSIS — D509 Iron deficiency anemia, unspecified: Secondary | ICD-10-CM | POA: Diagnosis not present

## 2015-10-07 DIAGNOSIS — R269 Unspecified abnormalities of gait and mobility: Secondary | ICD-10-CM | POA: Diagnosis not present

## 2015-10-07 DIAGNOSIS — G309 Alzheimer's disease, unspecified: Secondary | ICD-10-CM | POA: Diagnosis not present

## 2015-10-07 DIAGNOSIS — F0280 Dementia in other diseases classified elsewhere without behavioral disturbance: Secondary | ICD-10-CM | POA: Diagnosis not present

## 2015-10-07 DIAGNOSIS — D509 Iron deficiency anemia, unspecified: Secondary | ICD-10-CM | POA: Diagnosis not present

## 2015-10-07 DIAGNOSIS — R1310 Dysphagia, unspecified: Secondary | ICD-10-CM | POA: Diagnosis not present

## 2015-10-07 DIAGNOSIS — M6281 Muscle weakness (generalized): Secondary | ICD-10-CM | POA: Diagnosis not present

## 2015-10-08 DIAGNOSIS — R1310 Dysphagia, unspecified: Secondary | ICD-10-CM | POA: Diagnosis not present

## 2015-10-08 DIAGNOSIS — F0280 Dementia in other diseases classified elsewhere without behavioral disturbance: Secondary | ICD-10-CM | POA: Diagnosis not present

## 2015-10-08 DIAGNOSIS — M6281 Muscle weakness (generalized): Secondary | ICD-10-CM | POA: Diagnosis not present

## 2015-10-08 DIAGNOSIS — G309 Alzheimer's disease, unspecified: Secondary | ICD-10-CM | POA: Diagnosis not present

## 2015-10-08 DIAGNOSIS — D509 Iron deficiency anemia, unspecified: Secondary | ICD-10-CM | POA: Diagnosis not present

## 2015-10-08 DIAGNOSIS — R269 Unspecified abnormalities of gait and mobility: Secondary | ICD-10-CM | POA: Diagnosis not present

## 2015-10-12 DIAGNOSIS — F0280 Dementia in other diseases classified elsewhere without behavioral disturbance: Secondary | ICD-10-CM | POA: Diagnosis not present

## 2015-10-12 DIAGNOSIS — G309 Alzheimer's disease, unspecified: Secondary | ICD-10-CM | POA: Diagnosis not present

## 2015-10-12 DIAGNOSIS — M6281 Muscle weakness (generalized): Secondary | ICD-10-CM | POA: Diagnosis not present

## 2015-10-12 DIAGNOSIS — R269 Unspecified abnormalities of gait and mobility: Secondary | ICD-10-CM | POA: Diagnosis not present

## 2015-10-12 DIAGNOSIS — D509 Iron deficiency anemia, unspecified: Secondary | ICD-10-CM | POA: Diagnosis not present

## 2015-10-12 DIAGNOSIS — R1310 Dysphagia, unspecified: Secondary | ICD-10-CM | POA: Diagnosis not present

## 2015-10-14 DIAGNOSIS — R1310 Dysphagia, unspecified: Secondary | ICD-10-CM | POA: Diagnosis not present

## 2015-10-14 DIAGNOSIS — M6281 Muscle weakness (generalized): Secondary | ICD-10-CM | POA: Diagnosis not present

## 2015-10-14 DIAGNOSIS — G309 Alzheimer's disease, unspecified: Secondary | ICD-10-CM | POA: Diagnosis not present

## 2015-10-14 DIAGNOSIS — F0280 Dementia in other diseases classified elsewhere without behavioral disturbance: Secondary | ICD-10-CM | POA: Diagnosis not present

## 2015-10-14 DIAGNOSIS — D509 Iron deficiency anemia, unspecified: Secondary | ICD-10-CM | POA: Diagnosis not present

## 2015-10-14 DIAGNOSIS — R269 Unspecified abnormalities of gait and mobility: Secondary | ICD-10-CM | POA: Diagnosis not present

## 2015-10-15 DIAGNOSIS — G309 Alzheimer's disease, unspecified: Secondary | ICD-10-CM | POA: Diagnosis not present

## 2015-10-15 DIAGNOSIS — R1310 Dysphagia, unspecified: Secondary | ICD-10-CM | POA: Diagnosis not present

## 2015-10-15 DIAGNOSIS — M6281 Muscle weakness (generalized): Secondary | ICD-10-CM | POA: Diagnosis not present

## 2015-10-15 DIAGNOSIS — R269 Unspecified abnormalities of gait and mobility: Secondary | ICD-10-CM | POA: Diagnosis not present

## 2015-10-15 DIAGNOSIS — D509 Iron deficiency anemia, unspecified: Secondary | ICD-10-CM | POA: Diagnosis not present

## 2015-10-15 DIAGNOSIS — F0280 Dementia in other diseases classified elsewhere without behavioral disturbance: Secondary | ICD-10-CM | POA: Diagnosis not present

## 2015-11-03 DIAGNOSIS — R1310 Dysphagia, unspecified: Secondary | ICD-10-CM | POA: Diagnosis not present

## 2015-11-03 DIAGNOSIS — F0280 Dementia in other diseases classified elsewhere without behavioral disturbance: Secondary | ICD-10-CM | POA: Diagnosis not present

## 2015-11-03 DIAGNOSIS — M6281 Muscle weakness (generalized): Secondary | ICD-10-CM | POA: Diagnosis not present

## 2015-11-03 DIAGNOSIS — R269 Unspecified abnormalities of gait and mobility: Secondary | ICD-10-CM | POA: Diagnosis not present

## 2015-11-03 DIAGNOSIS — D509 Iron deficiency anemia, unspecified: Secondary | ICD-10-CM | POA: Diagnosis not present

## 2015-11-03 DIAGNOSIS — G309 Alzheimer's disease, unspecified: Secondary | ICD-10-CM | POA: Diagnosis not present

## 2015-11-12 DIAGNOSIS — S42302D Unspecified fracture of shaft of humerus, left arm, subsequent encounter for fracture with routine healing: Secondary | ICD-10-CM | POA: Diagnosis not present

## 2015-12-10 ENCOUNTER — Encounter (HOSPITAL_COMMUNITY): Payer: Self-pay | Admitting: Emergency Medicine

## 2015-12-10 ENCOUNTER — Ambulatory Visit (INDEPENDENT_AMBULATORY_CARE_PROVIDER_SITE_OTHER): Payer: Medicare Other

## 2015-12-10 ENCOUNTER — Ambulatory Visit (HOSPITAL_COMMUNITY)
Admission: EM | Admit: 2015-12-10 | Discharge: 2015-12-10 | Disposition: A | Discharge status: Home / Self Care | Payer: Medicare Other | Attending: Internal Medicine | Admitting: Internal Medicine

## 2015-12-10 DIAGNOSIS — J209 Acute bronchitis, unspecified: Secondary | ICD-10-CM

## 2015-12-10 DIAGNOSIS — R05 Cough: Secondary | ICD-10-CM | POA: Diagnosis not present

## 2015-12-10 MED ORDER — AZITHROMYCIN 250 MG PO TABS: 250.0000 mg | ORAL_TABLET | Freq: Every day | ORAL | Status: AC

## 2015-12-10 MED ORDER — ALBUTEROL SULFATE HFA 108 (90 BASE) MCG/ACT IN AERS: 2.0000 | INHALATION_SPRAY | RESPIRATORY_TRACT | Status: AC | PRN

## 2015-12-10 NOTE — Discharge Instructions (Signed)
Use tessalon perles (benzonatate) for cough as needed.  No new findings on chest xray today to suggest a new pneumonia.  Recheck for fever >100.5, worsening level of alertness (can't wake up), increased confusion, or if cough is not starting to improve in several days.   Prescription for albuterol inhaler (may help cough) was sent to the pharmacy.  Acute Bronchitis Bronchitis is when the airways that extend from the windpipe into the lungs get red, puffy, and painful (inflamed). Bronchitis often causes thick spit (mucus) to develop. This leads to a cough. A cough is the most common symptom of bronchitis. In acute bronchitis, the condition usually begins suddenly and goes away over time (usually in 2 weeks). Smoking, allergies, and asthma can make bronchitis worse. Repeated episodes of bronchitis may cause more lung problems. HOME CARE  Rest.  Drink enough fluids to keep your pee (urine) clear or pale yellow (unless you need to limit fluids as told by your doctor).  Only take over-the-counter or prescription medicines as told by your doctor.  Avoid smoking and secondhand smoke. These can make bronchitis worse. If you are a smoker, think about using nicotine gum or skin patches. Quitting smoking will help your lungs heal faster.  Reduce the chance of getting bronchitis again by:  Washing your hands often.  Avoiding people with cold symptoms.  Trying not to touch your hands to your mouth, nose, or eyes.  Follow up with your doctor as told. GET HELP IF: Your symptoms do not improve after 1 week of treatment. Symptoms include:  Cough.  Fever.  Coughing up thick spit.  Body aches.  Chest congestion.  Chills.  Shortness of breath.  Sore throat. GET HELP RIGHT AWAY IF:   You have an increased fever.  You have chills.  You have severe shortness of breath.  You have bloody thick spit (sputum).  You throw up (vomit) often.  You lose too much body fluid  (dehydration).  You have a severe headache.  You faint. MAKE SURE YOU:   Understand these instructions.  Will watch your condition.  Will get help right away if you are not doing well or get worse.   This information is not intended to replace advice given to you by your health care provider. Make sure you discuss any questions you have with your health care provider.   Document Released: 11/01/2007 Document Revised: 01/15/2013 Document Reviewed: 11/05/2012 Elsevier Interactive Patient Education [employer].

## 2015-12-10 NOTE — ED Provider Notes (Addendum)
CSN: [id number]     Arrival date & time 12/10/15  1002 History   First MD Initiated Contact with Patient 12/10/15 1043     Chief Complaint  Patient presents with  . Cough   HPI  82 year old gentleman presents today with cough for the last week. In the last 2 days, has had malaise. Had posttussive emesis this morning.  No fever. No runny/congested nose. Mild leg edema that improves when he is up and walking. Family reports that he had influenza and pneumonia, then a stay at rehabilitation before discharge home, in March of this year.Marland Kitchen History of hypertension, not currently on meds.  Past Medical History  Diagnosis Date  . Alzheimers disease   . Gout   . CVA (cerebral infarction)   . Kidney infection   . Pneumonia   . Sepsis ([place])   . AKI (acute kidney injury) ([place])   . Left humeral fracture   . PSA elevation   . Hypertensive urgency, malignant   . UTI (lower urinary tract infection)   . Metabolic encephalopathy   . TIA (transient ischemic attack)   . Microcytic anemia   . BPH (benign prostatic hyperplasia)   . Physical deconditioning   . Influenza A   . Aspiration pneumonia of right lower lobe ([place])   . Anemia of chronic disease   . Thrombocytosis ([place])   . Leukocytosis   . Dysphagia    Past Surgical History  Procedure Laterality Date  . Cataract extraction Left 2012  . Mouth surgery  01/2012    tooth extractions and bone shaved    Family History  Problem Relation Age of Onset  . High blood pressure Sister   . Diabetes Neg Hx    Social History  Substance Use Topics  . Smoking status: Never Smoker   . Smokeless tobacco: None  . Alcohol Use: No    Review of Systems  All other systems reviewed and are negative.   Allergies  Review of patient's allergies indicates no known allergies.  Home Medications   Prior to Admission medications   Medication Sig Start Date End Date Taking? Authorizing Provider  acetaminophen (TYLENOL) 325 MG tablet Take 2 tablets (650  mg total) by mouth every 6 (six) hours as needed for mild pain (or Fever >/= 101). 08/12/15   Robbie Lis, MD  ferrous sulfate 325 (65 FE) MG tablet Take 325 mg by mouth daily with breakfast.    Historical Provider, MD  fish oil-omega-3 fatty acids 1000 MG capsule Take 1 g by mouth every morning.     Historical Provider, MD  Multiple Vitamin (MULTIVITAMIN WITH MINERALS) TABS Take 1 tablet by mouth every morning.     Historical Provider, MD  ondansetron (ZOFRAN) 4 MG tablet Take 1 tablet (4 mg total) by mouth every 6 (six) hours as needed for nausea. 08/12/15   Robbie Lis, MD  Phenyleph-Doxylamine-DM-APAP (ALKA SELTZER PLUS PO) Take 1 each by mouth every 6 (six) hours as needed (flu symotoms). Reported on 08/17/2015    Historical Provider, MD  tamsulosin (FLOMAX) 0.4 MG CAPS Take 1 capsule (0.4 mg total) by mouth daily. 10/20/12   Domenic Polite, MD      BP 130/67 mmHg  Pulse 60  Temp(Src) 100.2 F (37.9 C) (Temporal)  Resp 16  SpO2 100% Physical Exam  Constitutional:  Sleeping in wheelchair, nicely groomed Somewhat difficult to arouse, but does wake.  HENT:  Head: Atraumatic.  Eyes:  Conjugate gaze, no eye redness/drainage  Cardiovascular:  Normal rate and regular rhythm.   Heart rate 100s on exam  Pulmonary/Chest: No respiratory distress. He has no wheezes.  Some rhonchi left base greater than right base. Wheezy cough  Abdominal: He exhibits no distension.  Musculoskeletal: Normal range of motion.  Trace bilateral lower extremity pitting edema  Neurological:  Pleasant, cooperative  Skin: Skin is warm and dry.  No cyanosis  Nursing note and vitals reviewed.   ED Course  Procedures (including critical care time)  Dg Chest 2 View  12/10/2015  CLINICAL DATA:  Increasing cough, wheezing.  Vomiting. EXAM: CHEST  2 VIEW COMPARISON:  08/11/2015 FINDINGS: Heart is normal size. Peribronchial thickening and interstitial prominence within the lungs. Bibasilar for atelectasis or  infiltrates, similar to prior study. No visible effusions. No acute bony abnormality. IMPRESSION: Mild bronchitic changes. Bibasilar atelectasis or infiltrates, similar to prior study. Electronically Signed   By: Rolm Baptise M.D.   On: 12/10/2015 11:26    MDM   1. Acute bronchitis, unspecified organism    Meds ordered this encounter  Medications  . albuterol (PROVENTIL HFA;VENTOLIN HFA) 108 (90 Base) MCG/ACT inhaler    Sig: Inhale 2 puffs into the lungs every 4 (four) hours as needed for wheezing or shortness of breath (or cough).    Dispense:  1 Inhaler    Refill:  0    Please dispense with spacer device and instruct patient in use.  Marland Kitchen azithromycin (ZITHROMAX) 250 MG tablet    Sig: Take 1 tablet (250 mg total) by mouth daily. Take first 2 tablets together, then 1 every day until finished.    Dispense:  6 tablet    Refill:  0   Recheck if not starting to improve in a few days. To ED if worse.  Use tessalon perles (benzonatate) for cough as needed.  No new findings on chest xray today to suggest a new pneumonia.  Recheck for fever >100.5, worsening level of alertness (can't wake up), increased confusion, or if cough is not starting to improve in several days.   Prescription for albuterol inhaler (may help cough) was sent to the pharmacy. Sherlene Shams, MD 12/11/15 1314  Sherlene Shams, MD 12/11/15 [phone]

## 2015-12-10 NOTE — ED Notes (Signed)
Dr. Valere Dross made aware of temp prior to discharge.

## 2015-12-10 NOTE — ED Notes (Signed)
Pt's daughter reports he has had a cough for about a week. It has worsened over the last two days. PT's daughter has given home remedies.

## 2015-12-18 ENCOUNTER — Encounter (HOSPITAL_COMMUNITY): Payer: Self-pay | Admitting: *Deleted

## 2015-12-18 ENCOUNTER — Emergency Department (HOSPITAL_COMMUNITY): Payer: Medicare Other

## 2015-12-18 ENCOUNTER — Encounter (HOSPITAL_COMMUNITY): Payer: Self-pay

## 2015-12-18 ENCOUNTER — Ambulatory Visit (HOSPITAL_COMMUNITY)
Admission: EM | Admit: 2015-12-18 | Discharge: 2015-12-18 | Disposition: A | Discharge status: Nursing Home (Includes ICF) | Payer: Medicare Other | Attending: Family Medicine | Admitting: Family Medicine

## 2015-12-18 ENCOUNTER — Emergency Department (HOSPITAL_COMMUNITY)
Admission: EM | Admit: 2015-12-18 | Discharge: 2015-12-18 | Disposition: A | Discharge status: Home / Self Care | Payer: Medicare Other | Attending: Emergency Medicine | Admitting: Emergency Medicine

## 2015-12-18 DIAGNOSIS — Z79899 Other long term (current) drug therapy: Secondary | ICD-10-CM | POA: Diagnosis not present

## 2015-12-18 DIAGNOSIS — R059 Cough, unspecified: Secondary | ICD-10-CM

## 2015-12-18 DIAGNOSIS — R05 Cough: Secondary | ICD-10-CM

## 2015-12-18 DIAGNOSIS — Z8673 Personal history of transient ischemic attack (TIA), and cerebral infarction without residual deficits: Secondary | ICD-10-CM | POA: Insufficient documentation

## 2015-12-18 DIAGNOSIS — G309 Alzheimer's disease, unspecified: Secondary | ICD-10-CM | POA: Insufficient documentation

## 2015-12-18 DIAGNOSIS — R053 Chronic cough: Secondary | ICD-10-CM

## 2015-12-18 LAB — CBC
HCT: 37.4 % — ABNORMAL LOW (ref 39.0–52.0)
HEMOGLOBIN: 11.6 g/dL — AB (ref 13.0–17.0)
MCH: 21.3 pg — AB (ref 26.0–34.0)
MCHC: 31 g/dL (ref 30.0–36.0)
MCV: 68.8 fL — ABNORMAL LOW (ref 78.0–100.0)
PLATELETS: 386 10*3/uL (ref 150–400)
RBC: 5.44 MIL/uL (ref 4.22–5.81)
RDW: 17.1 % — ABNORMAL HIGH (ref 11.5–15.5)
WBC: 9.9 10*3/uL (ref 4.0–10.5)

## 2015-12-18 LAB — BASIC METABOLIC PANEL
Anion gap: 7 (ref 5–15)
BUN: 11 mg/dL (ref 6–20)
CHLORIDE: 105 mmol/L (ref 101–111)
CO2: 27 mmol/L (ref 22–32)
CREATININE: 1 mg/dL (ref 0.61–1.24)
Calcium: 8.9 mg/dL (ref 8.9–10.3)
Glucose, Bld: 96 mg/dL (ref 65–99)
POTASSIUM: 4 mmol/L (ref 3.5–5.1)
SODIUM: 139 mmol/L (ref 135–145)

## 2015-12-18 LAB — BRAIN NATRIURETIC PEPTIDE: B NATRIURETIC PEPTIDE 5: 69.2 pg/mL (ref 0.0–100.0)

## 2015-12-18 NOTE — Discharge Instructions (Signed)
Return to the ED with any concerns including difficulty breathing, chest pain, fainting, decreased level of alertness/lethargy, or any other alarming symptoms °

## 2015-12-18 NOTE — ED Notes (Addendum)
Error in documentation for pain scale; pt's breathing is quiet, normal, and unlabored.

## 2015-12-18 NOTE — ED Notes (Signed)
Crackers provided w/ family stating pt is now awake and c/o hunger.

## 2015-12-18 NOTE — ED Notes (Signed)
Pt at baseline ambulation upon discharge

## 2015-12-18 NOTE — ED Notes (Addendum)
Patient here with ongoing cough and congestion after being seen at [facility] and treated with antibiotics for bronchitis. Family concerned because of ongoing dry cough. Skin warm and dry, patient in NAD. Denies fever, no pain

## 2015-12-18 NOTE — ED Notes (Signed)
Family member to tranport pt to ED for further eval and treatment.  Family states they are going to take him to eat first, and state they don't know if they'll stay in ED if it's crowded.  Reiterated to family that it is our recommendation that he be transferred to ED; family verbalized understanding.  Report called to Coralyn Pear, ED First Nurse.

## 2015-12-18 NOTE — ED Provider Notes (Signed)
CSN: [id number]     Arrival date & time 12/18/15  1442 History   First MD Initiated Contact with Patient 12/18/15 1839     Chief Complaint  Patient presents with  . Cough  . Nasal Congestion     (Consider location/radiation/quality/duration/timing/severity/associated sxs/prior Treatment) HPI  Pt presenting with c/o ongoing cough.  He was treated for bronchitis with albuterol inhaler as well as antibiotics at beginning of illness - he has had ongoing cough- helped by inhaler and robitussin DM.  Family is concerned that he may have pneumonia.  Cough is nonproductive.  No fever/chills.  He continues to eat and drink well.  No chest pain.   There are no other associated systemic symptoms, there are no other alleviating or modifying factors.   Past Medical History  Diagnosis Date  . Alzheimers disease   . Gout   . CVA (cerebral infarction)   . Kidney infection   . Pneumonia   . Sepsis ([place])   . AKI (acute kidney injury) ([place])   . Left humeral fracture   . PSA elevation   . Hypertensive urgency, malignant   . UTI (lower urinary tract infection)   . Metabolic encephalopathy   . TIA (transient ischemic attack)   . Microcytic anemia   . BPH (benign prostatic hyperplasia)   . Physical deconditioning   . Influenza A   . Aspiration pneumonia of right lower lobe ([place])   . Anemia of chronic disease   . Thrombocytosis ([place])   . Leukocytosis   . Dysphagia    Past Surgical History  Procedure Laterality Date  . Cataract extraction Left 2012  . Mouth surgery  01/2012    tooth extractions and bone shaved    Family History  Problem Relation Age of Onset  . High blood pressure Sister   . Diabetes Neg Hx    Social History  Substance Use Topics  . Smoking status: Never Smoker   . Smokeless tobacco: None  . Alcohol Use: No    Review of Systems  ROS reviewed and all otherwise negative except for mentioned in HPI    Allergies  Review of patient's allergies indicates no known  allergies.  Home Medications   Prior to Admission medications   Medication Sig Start Date End Date Taking? Authorizing Provider  acetaminophen (TYLENOL) 325 MG tablet Take 2 tablets (650 mg total) by mouth every 6 (six) hours as needed for mild pain (or Fever >/= 101). 08/12/15   Robbie Lis, MD  albuterol (PROVENTIL HFA;VENTOLIN HFA) 108 (90 Base) MCG/ACT inhaler Inhale 2 puffs into the lungs every 4 (four) hours as needed for wheezing or shortness of breath (or cough). 12/10/15   Sherlene Shams, MD  ferrous sulfate 325 (65 FE) MG tablet Take 325 mg by mouth daily with breakfast.    Historical Provider, MD  fish oil-omega-3 fatty acids 1000 MG capsule Take 1 g by mouth every morning.     Historical Provider, MD  Multiple Vitamin (MULTIVITAMIN WITH MINERALS) TABS Take 1 tablet by mouth every morning.     Historical Provider, MD  ondansetron (ZOFRAN) 4 MG tablet Take 1 tablet (4 mg total) by mouth every 6 (six) hours as needed for nausea. 08/12/15   Robbie Lis, MD  Phenyleph-Doxylamine-DM-APAP (ALKA SELTZER PLUS PO) Take 1 each by mouth every 6 (six) hours as needed (flu symotoms). Reported on 08/17/2015    Historical Provider, MD  tamsulosin (FLOMAX) 0.4 MG CAPS Take 1 capsule (0.4 mg total)  by mouth daily. 10/20/12   Domenic Polite, MD   BP 152/78 mmHg  Pulse 88  Temp(Src) 97.5 F (36.4 C) (Oral)  Resp 18  SpO2 100%  Vitals reviewed Physical Exam  Physical Examination: General appearance - alert, well appearing, and in no distress Mental status - alert, oriented to person, place, and time Eyes - no conjunctival injection, no scleral icterus Mouth - mucous membranes moist, pharynx normal without lesions Neck - supple, no significant adenopathy Chest - clear to auscultation, no wheezes, rales or rhonchi, symmetric air entry, normal respiratory effort Heart - normal rate, regular rhythm, normal S1, S2, no murmurs, rubs, clicks or gallops Abdomen - soft, nontender, nondistended, no masses  or organomegaly Neurological - alert, oriented, normal speech Extremities - peripheral pulses normal, no pedal edema, no clubbing or cyanosis Skin - normal coloration and turgor, no rashes  ED Course  Procedures (including critical care time) Labs Review Labs Reviewed  CBC - Abnormal; Notable for the following:    Hemoglobin 11.6 (*)    HCT 37.4 (*)    MCV 68.8 (*)    MCH 21.3 (*)    RDW 17.1 (*)    All other components within normal limits  BASIC METABOLIC PANEL  BRAIN NATRIURETIC PEPTIDE    Imaging Review Dg Chest 1 View  12/18/2015  CLINICAL DATA:  Ongoing cough and congestion, status post treatment with antibiotics for bronchitis. Continued dry cough. EXAM: CHEST 1 VIEW COMPARISON:  Chest x-rays dated 12/10/2015, 08/11/2015 and 10/19/2012. FINDINGS: Heart size is normal. Overall cardiomediastinal silhouette is stable in size and configuration. Atherosclerotic changes again noted at the aortic arch. Prominent interstitial markings again noted bilaterally, not significantly changed compared to the earlier exams. No new lung findings. No confluent airspace opacity to suggest a developing pneumonia. No pleural effusion or pneumothorax seen. Deformity of the left humeral neck, related to a healing fracture initially seen in March of 2017. Suspect some degree of nonunion. No acute-appearing osseous abnormality. IMPRESSION: 1. Coarse interstitial lung markings, not significantly changed compared to the earlier exams, most likely representing chronic interstitial lung disease and/or chronic mild interstitial edema. 2. No acute lung findings. No evidence of pneumonia. No pleural effusion seen. 3. Aortic atherosclerosis. Electronically Signed   By: Franki Cabot M.D.   On: 12/18/2015 16:28   I have personally reviewed and evaluated these images and lab results as part of my medical decision-making.   EKG Interpretation None      MDM   Final diagnoses:  Cough    Pt presenting with c/o  ongoing cough- he has been treated with abx for possible bronchitis- pt has had improved cough after inhaler and robitussin, CXR tonight shows no pneumonia.  Labs are reassuring, BNP is negative- doubt pulmonary edema.  Discharged with strict return precautions.  Pt agreeable with plan.    Alfonzo Beers, MD 12/18/15 [phone]

## 2015-12-18 NOTE — ED Provider Notes (Signed)
CSN: [id number]     Arrival date & time 12/18/15  1200 History   First MD Initiated Contact with Patient 12/18/15 1239     Chief Complaint  Patient presents with  . Cough  . Follow-up   (Consider location/radiation/quality/duration/timing/severity/associated sxs/prior Treatment) Patient is a 82 y.o. male presenting with cough. The history is provided by a relative.  Cough Cough characteristics:  Non-productive and dry Severity:  Moderate Onset quality:  Gradual Progression:  Improving (seen 7/14 for same with fever but neg, cxr, given abxand sx conitnue) Chronicity:  New Associated symptoms: no fever, no shortness of breath and no wheezing   Risk factors: recent infection     Past Medical History  Diagnosis Date  . Alzheimers disease   . Gout   . CVA (cerebral infarction)   . Kidney infection   . Pneumonia   . Sepsis ([place])   . AKI (acute kidney injury) ([place])   . Left humeral fracture   . PSA elevation   . Hypertensive urgency, malignant   . UTI (lower urinary tract infection)   . Metabolic encephalopathy   . TIA (transient ischemic attack)   . Microcytic anemia   . BPH (benign prostatic hyperplasia)   . Physical deconditioning   . Influenza A   . Aspiration pneumonia of right lower lobe ([place])   . Anemia of chronic disease   . Thrombocytosis ([place])   . Leukocytosis   . Dysphagia    Past Surgical History  Procedure Laterality Date  . Cataract extraction Left 2012  . Mouth surgery  01/2012    tooth extractions and bone shaved    Family History  Problem Relation Age of Onset  . High blood pressure Sister   . Diabetes Neg Hx    Social History  Substance Use Topics  . Smoking status: Never Smoker   . Smokeless tobacco: None  . Alcohol Use: No    Review of Systems  Constitutional: Negative.  Negative for fever.  Respiratory: Positive for cough. Negative for shortness of breath and wheezing.   Cardiovascular: Negative.   All other systems reviewed and are  negative.   Allergies  Review of patient's allergies indicates no known allergies.  Home Medications   Prior to Admission medications   Medication Sig Start Date End Date Taking? Authorizing Provider  acetaminophen (TYLENOL) 325 MG tablet Take 2 tablets (650 mg total) by mouth every 6 (six) hours as needed for mild pain (or Fever >/= 101). 08/12/15  Yes Robbie Lis, MD  albuterol (PROVENTIL HFA;VENTOLIN HFA) 108 (90 Base) MCG/ACT inhaler Inhale 2 puffs into the lungs every 4 (four) hours as needed for wheezing or shortness of breath (or cough). 12/10/15  Yes Sherlene Shams, MD  ferrous sulfate 325 (65 FE) MG tablet Take 325 mg by mouth daily with breakfast.   Yes Historical Provider, MD  fish oil-omega-3 fatty acids 1000 MG capsule Take 1 g by mouth every morning.    Yes Historical Provider, MD  Multiple Vitamin (MULTIVITAMIN WITH MINERALS) TABS Take 1 tablet by mouth every morning.    Yes Historical Provider, MD  ondansetron (ZOFRAN) 4 MG tablet Take 1 tablet (4 mg total) by mouth every 6 (six) hours as needed for nausea. 08/12/15  Yes Robbie Lis, MD  Phenyleph-Doxylamine-DM-APAP (ALKA SELTZER PLUS PO) Take 1 each by mouth every 6 (six) hours as needed (flu symotoms). Reported on 08/17/2015   Yes Historical Provider, MD  tamsulosin (FLOMAX) 0.4 MG CAPS Take 1 capsule (  0.4 mg total) by mouth daily. 10/20/12  Yes Domenic Polite, MD   Meds Ordered and Administered this Visit  Medications - No data to display  BP 155/53 mmHg  Pulse 89  Temp(Src) 98.9 F (37.2 C) (Temporal)  Resp 16  SpO2 97% No data found.   Physical Exam  Constitutional: He is oriented to person, place, and time. He appears well-developed and well-nourished. No distress.  Cardiovascular: Regular rhythm and normal heart sounds.   Pulmonary/Chest: Effort normal and breath sounds normal.  Neurological: He is alert and oriented to person, place, and time.  Skin: Skin is warm and dry.  Nursing note and vitals  reviewed.   ED Course  Procedures (including critical care time)  Labs Review Labs Reviewed - No data to display  Imaging Review No results found.   Visual Acuity Review  Right Eye Distance:   Left Eye Distance:   Bilateral Distance:    Right Eye Near:   Left Eye Near:    Bilateral Near:         MDM   1. Cough, persistent        Billy Fischer, MD 12/18/15 1332

## 2015-12-18 NOTE — ED Notes (Signed)
Pt was seen 7/15 in [facility] - per family member, was given abx and inhaler to prevent pneumonia (neg CXR per family) after having fever & cough.  Pt completed abx with resolution of fevers.  Has been taking benzonatate and dextromethorphan with improvement, but pt still coughing a lot.  Pt will only swallow "warm things", so family believes he has sore throat and wants to make sure he doesn't have pneumonia.

## 2016-04-07 DIAGNOSIS — N400 Enlarged prostate without lower urinary tract symptoms: Secondary | ICD-10-CM | POA: Diagnosis not present

## 2016-10-17 ENCOUNTER — Emergency Department (HOSPITAL_COMMUNITY): Payer: No Typology Code available for payment source

## 2016-10-17 ENCOUNTER — Emergency Department (HOSPITAL_COMMUNITY)
Admission: EM | Admit: 2016-10-17 | Discharge: 2016-10-17 | Disposition: A | Discharge status: Home / Self Care | Payer: No Typology Code available for payment source | Attending: Emergency Medicine | Admitting: Emergency Medicine

## 2016-10-17 DIAGNOSIS — S82891A Other fracture of right lower leg, initial encounter for closed fracture: Secondary | ICD-10-CM | POA: Diagnosis not present

## 2016-10-17 DIAGNOSIS — S199XXA Unspecified injury of neck, initial encounter: Secondary | ICD-10-CM | POA: Diagnosis not present

## 2016-10-17 DIAGNOSIS — S82841A Displaced bimalleolar fracture of right lower leg, initial encounter for closed fracture: Secondary | ICD-10-CM | POA: Insufficient documentation

## 2016-10-17 DIAGNOSIS — Y939 Activity, unspecified: Secondary | ICD-10-CM | POA: Insufficient documentation

## 2016-10-17 DIAGNOSIS — S299XXA Unspecified injury of thorax, initial encounter: Secondary | ICD-10-CM | POA: Diagnosis not present

## 2016-10-17 DIAGNOSIS — R402441 Other coma, without documented Glasgow coma scale score, or with partial score reported, in the field [EMT or ambulance]: Secondary | ICD-10-CM | POA: Diagnosis not present

## 2016-10-17 DIAGNOSIS — Y999 Unspecified external cause status: Secondary | ICD-10-CM | POA: Insufficient documentation

## 2016-10-17 DIAGNOSIS — Y92410 Unspecified street and highway as the place of occurrence of the external cause: Secondary | ICD-10-CM | POA: Diagnosis not present

## 2016-10-17 DIAGNOSIS — S82831A Other fracture of upper and lower end of right fibula, initial encounter for closed fracture: Secondary | ICD-10-CM | POA: Insufficient documentation

## 2016-10-17 DIAGNOSIS — S82844A Nondisplaced bimalleolar fracture of right lower leg, initial encounter for closed fracture: Secondary | ICD-10-CM | POA: Diagnosis not present

## 2016-10-17 DIAGNOSIS — S99911A Unspecified injury of right ankle, initial encounter: Secondary | ICD-10-CM | POA: Diagnosis present

## 2016-10-17 DIAGNOSIS — M79661 Pain in right lower leg: Secondary | ICD-10-CM | POA: Diagnosis not present

## 2016-10-17 DIAGNOSIS — F0390 Unspecified dementia without behavioral disturbance: Secondary | ICD-10-CM | POA: Diagnosis not present

## 2016-10-17 DIAGNOSIS — S3993XA Unspecified injury of pelvis, initial encounter: Secondary | ICD-10-CM | POA: Diagnosis not present

## 2016-10-17 DIAGNOSIS — S0990XA Unspecified injury of head, initial encounter: Secondary | ICD-10-CM | POA: Diagnosis not present

## 2016-10-17 DIAGNOSIS — S82843A Displaced bimalleolar fracture of unspecified lower leg, initial encounter for closed fracture: Secondary | ICD-10-CM

## 2016-10-17 LAB — CBC WITH DIFFERENTIAL/PLATELET
Basophils Absolute: 0 10*3/uL (ref 0.0–0.1)
Basophils Relative: 1 %
Eosinophils Absolute: 0 10*3/uL (ref 0.0–0.7)
Eosinophils Relative: 0 %
HCT: 41 % (ref 39.0–52.0)
HEMOGLOBIN: 13 g/dL (ref 13.0–17.0)
LYMPHS ABS: 1.2 10*3/uL (ref 0.7–4.0)
Lymphocytes Relative: 16 %
MCH: 22.9 pg — AB (ref 26.0–34.0)
MCHC: 31.7 g/dL (ref 30.0–36.0)
MCV: 72.3 fL — AB (ref 78.0–100.0)
MONO ABS: 0.4 10*3/uL (ref 0.1–1.0)
Monocytes Relative: 6 %
Neutro Abs: 6.1 10*3/uL (ref 1.7–7.7)
Neutrophils Relative %: 78 %
Platelets: 285 10*3/uL (ref 150–400)
RBC: 5.67 MIL/uL (ref 4.22–5.81)
RDW: 16.4 % — ABNORMAL HIGH (ref 11.5–15.5)
WBC: 7.8 10*3/uL (ref 4.0–10.5)

## 2016-10-17 LAB — COMPREHENSIVE METABOLIC PANEL
ALK PHOS: 122 U/L (ref 38–126)
ALT: 20 U/L (ref 17–63)
ANION GAP: 7 (ref 5–15)
AST: 30 U/L (ref 15–41)
Albumin: 3.9 g/dL (ref 3.5–5.0)
BILIRUBIN TOTAL: 0.9 mg/dL (ref 0.3–1.2)
BUN: 13 mg/dL (ref 6–20)
CALCIUM: 9.5 mg/dL (ref 8.9–10.3)
CO2: 31 mmol/L (ref 22–32)
CREATININE: 1.15 mg/dL (ref 0.61–1.24)
Chloride: 103 mmol/L (ref 101–111)
GFR, EST NON AFRICAN AMERICAN: 54 mL/min — AB (ref >60)
Glucose, Bld: 126 mg/dL — ABNORMAL HIGH (ref 65–99)
Potassium: 4.7 mmol/L (ref 3.5–5.1)
Sodium: 141 mmol/L (ref 135–145)
TOTAL PROTEIN: 7.2 g/dL (ref 6.5–8.1)

## 2016-10-17 LAB — LIPASE, BLOOD: LIPASE: 46 U/L (ref 11–51)
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
## 2016-10-17 NOTE — Discharge Instructions (Addendum)
Do not let patient put weight down on right leg.  Keep splint intact and dry.

## 2016-10-17 NOTE — Progress Notes (Signed)
Orthopedic Tech Progress Note Patient Details:  Douglas Mckee 10/12/1922 [id number]  Ortho Devices Type of Ortho Device: Ace wrap, Post (short leg) splint Ortho Device/Splint Location: RLE Ortho Device/Splint Interventions: Ordered, Application   Braulio Bosch 10/17/2016, 5:03 PM

## 2016-10-17 NOTE — ED Notes (Signed)
Ortho paged. 
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
## 2016-10-20 DIAGNOSIS — S82891A Other fracture of right lower leg, initial encounter for closed fracture: Secondary | ICD-10-CM | POA: Diagnosis not present

## 2016-11-07 ENCOUNTER — Encounter (HOSPITAL_COMMUNITY): Payer: Self-pay | Admitting: Emergency Medicine

## 2016-11-07 ENCOUNTER — Ambulatory Visit (HOSPITAL_COMMUNITY)
Admission: EM | Admit: 2016-11-07 | Discharge: 2016-11-07 | Disposition: A | Discharge status: Home / Self Care | Payer: Medicare Other | Attending: Family Medicine | Admitting: Family Medicine

## 2016-11-07 DIAGNOSIS — R059 Cough, unspecified: Secondary | ICD-10-CM | POA: Diagnosis present

## 2016-11-07 DIAGNOSIS — R05 Cough: Secondary | ICD-10-CM | POA: Diagnosis not present
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
## 2016-11-07 NOTE — ED Triage Notes (Signed)
The patient presented to the [facility] with a complaint of a cough and congestion. The patient is demented per baseline.

## 2016-11-07 NOTE — Discharge Instructions (Signed)
Continue mucinex for cough take 400-600 mg twice daily (1 to 1 and a half tablet) If fever, chest pain or shortness of breath starts please be rechecked by your primary doctor or return here if primary unavailable

## 2016-11-13 DIAGNOSIS — S82841D Displaced bimalleolar fracture of right lower leg, subsequent encounter for closed fracture with routine healing: Secondary | ICD-10-CM | POA: Diagnosis not present

## 2016-12-01 DIAGNOSIS — S82841D Displaced bimalleolar fracture of right lower leg, subsequent encounter for closed fracture with routine healing: Secondary | ICD-10-CM | POA: Diagnosis not present

## 2017-04-11 DIAGNOSIS — N400 Enlarged prostate without lower urinary tract symptoms: Secondary | ICD-10-CM | POA: Diagnosis not present

## 2017-04-20 ENCOUNTER — Ambulatory Visit (HOSPITAL_COMMUNITY)
Admission: EM | Admit: 2017-04-20 | Discharge: 2017-04-20 | Disposition: A | Discharge status: Home / Self Care | Payer: Medicare Other | Attending: Internal Medicine | Admitting: Internal Medicine

## 2017-04-20 ENCOUNTER — Other Ambulatory Visit: Payer: Self-pay

## 2017-04-20 ENCOUNTER — Encounter (HOSPITAL_COMMUNITY): Payer: Self-pay | Admitting: Emergency Medicine

## 2017-04-20 ENCOUNTER — Ambulatory Visit (INDEPENDENT_AMBULATORY_CARE_PROVIDER_SITE_OTHER): Payer: Medicare Other

## 2017-04-20 DIAGNOSIS — R059 Cough, unspecified: Secondary | ICD-10-CM

## 2017-04-20 DIAGNOSIS — R05 Cough: Secondary | ICD-10-CM | POA: Diagnosis not present

## 2017-04-20 DIAGNOSIS — R918 Other nonspecific abnormal finding of lung field: Secondary | ICD-10-CM | POA: Diagnosis not present

## 2017-04-20 MED ORDER — AZITHROMYCIN 250 MG PO TABS: 250.0000 mg | ORAL_TABLET | Freq: Every day | ORAL | 0 refills | Status: AC

## 2017-04-20 MED ORDER — BENZONATATE 100 MG PO CAPS: 100.0000 mg | ORAL_CAPSULE | Freq: Three times a day (TID) | ORAL | 0 refills | Status: DC

## 2017-04-20 NOTE — Discharge Instructions (Addendum)
We recommend taking him to the ED given his age, dementia, and questionable pneumonia.   If you do not take him, please have a low threshold for returning if he appears to be declining or changing from his baseline at all.   Try tessalon for cough.

## 2017-04-20 NOTE — ED Provider Notes (Signed)
Douglas Mckee    CSN: [id number] Arrival date & time: 04/20/17  1229     History   Chief Complaint Chief Complaint  Patient presents with  . Cough    HPI Douglas Mckee is a 83 y.o. male with history of dementia presenting with a cough. Patient accompanied by family and history obtained from family, due to dementia and being almost non-verbal. He has had a cough for approximately one week. Family states it is productive but he does not spit anything out and swallows it. They report mild nasal congestion along with cough. Today he is about at his baseline in regards to mobility and cognitive function. They report he continues to eat and drink but in the last day he has slightly decreased how much he eats. They have continued to try and keep him hydrated. Deny any indication from him that he is in pain or experiencing pain anywhere. Gave him Mucinex but report his right cheek swelling. He had taken guaifenesin in the past without issue. No history of asthma, COPD, or a smoking history.   HPI  Past Medical History:  Diagnosis Date  . AKI (acute kidney injury) (Douglas Mckee)   . Alzheimers disease   . Anemia of chronic disease   . Aspiration pneumonia of right lower lobe (Douglas Mckee)   . BPH (benign prostatic hyperplasia)   . CVA (cerebral infarction)   . Dysphagia   . Gout   . Hypertensive urgency, malignant   . Influenza A   . Kidney infection   . Left humeral fracture   . Leukocytosis   . Metabolic encephalopathy   . Microcytic anemia   . Physical deconditioning   . Pneumonia   . PSA elevation   . Sepsis (Douglas Mckee)   . Thrombocytosis (Douglas Mckee)   . TIA (transient ischemic attack)   . UTI (lower urinary tract infection)     Patient Active Problem List   Diagnosis Date Noted  . Cough 11/07/2016  . Pneumonia 08/06/2015  . Sepsis (Douglas Mckee) 08/06/2015  . AKI (acute kidney injury) (Douglas Mckee) 08/06/2015  . Left humeral fracture 08/06/2015  . Dementia 10/21/2012  . PSA elevation 10/20/2012  .  Hypertensive urgency, malignant 10/18/2012  . UTI (lower urinary tract infection) 10/18/2012  . Metabolic encephalopathy 11/19/7626  . TIA (transient ischemic attack) 10/18/2012  . Microcytic anemia 10/18/2012  . BPH (benign prostatic hyperplasia) 10/18/2012  . CVA, old, cognitive deficits 10/18/2012  . Alzheimer's dementia 10/18/2012    Past Surgical History:  Procedure Laterality Date  . CATARACT EXTRACTION Left 2012  . MOUTH SURGERY  01/2012   tooth extractions and bone shaved        Home Medications    Prior to Admission medications   Medication Sig Start Date End Date Taking? Authorizing Provider  acetaminophen (TYLENOL) 325 MG tablet Take 2 tablets (650 mg total) by mouth every 6 (six) hours as needed for mild pain (or Fever >/= 101). 08/12/15   Robbie Lis, MD  acetaminophen (TYLENOL) 325 MG tablet Take 650 mg by mouth every 6 (six) hours as needed for mild pain.    [provider]  albuterol (PROVENTIL HFA;VENTOLIN HFA) 108 (90 Base) MCG/ACT inhaler Inhale 2 puffs into the lungs every 4 (four) hours as needed for wheezing or shortness of breath (or cough). 12/10/15   Sherlene Shams, MD  azithromycin (ZITHROMAX Z-PAK) 250 MG tablet Take 1 tablet (250 mg total) by mouth daily for 5 days. 04/20/17 04/25/17  Mashanda Ishibashi, Elesa Hacker, PA-C  benzonatate (TESSALON) 100 MG capsule Take 1 capsule (100 mg total) by mouth every 8 (eight) hours. 04/20/17   Baleria Wyman C, PA-C  ferrous sulfate 325 (65 FE) MG tablet Take 325 mg by mouth daily with breakfast.    [provider]  finasteride (PROSCAR) 5 MG tablet Take 5 mg by mouth daily.    [provider]  fish oil-omega-3 fatty acids 1000 MG capsule Take 1 g by mouth every morning.     [provider]  guaifenesin (HUMIBID E) 400 MG TABS tablet Take 400 mg by mouth 2 (two) times daily as needed.    [provider]  Multiple Vitamin (MULTIVITAMIN WITH MINERALS) TABS Take 1 tablet by mouth every  morning.     [provider]  Omega-3 Fatty Acids (FISH OIL) 1000 MG CPDR Take 1,000 mg by mouth daily.    [provider]  tamsulosin (FLOMAX) 0.4 MG CAPS Take 1 capsule (0.4 mg total) by mouth daily. 10/20/12   Domenic Polite, MD    Family History Family History  Problem Relation Age of Onset  . High blood pressure Sister   . Diabetes Neg Hx     Social History Social History   Tobacco Use  . Smoking status: Never Smoker  Substance Use Topics  . Alcohol use: No  . Drug use: No     Allergies   Patient has no known allergies.   Review of Systems Review of Systems  Constitutional: Positive for appetite change. Negative for activity change and fever.  HENT: Positive for rhinorrhea and sore throat.   Respiratory: Positive for cough. Negative for choking and shortness of breath.   Cardiovascular: Negative for chest pain.  Gastrointestinal: Negative for abdominal pain, diarrhea and vomiting.  Musculoskeletal: Negative for myalgias.   ROS limited due to dementia, obtained from family  Physical Exam Triage Vital Signs ED Triage Vitals [04/20/17 1306]  Enc Vitals Group     BP (!) 152/60     Pulse Rate 82     Resp 18     Temp 97.6 F (36.4 C)     Temp Source Oral     SpO2 94 %     Weight      Height      Head Circumference      Peak Flow      Pain Score      Pain Loc      Pain Edu?      Excl. in [place]?    No data found.  Updated Vital Signs BP (!) 152/60   Pulse 82   Temp 97.6 F (36.4 C) (Oral)   Resp 18   SpO2 94%   Vitals reviewed. O2 compared to previous visits, have ranged between 94-97%   Physical Exam  Constitutional:  Well-developed, thin elderly male in wheelchair  HENT:  Mouth/Throat: Oropharynx is clear and moist. No oropharyngeal exudate.  Eyes: EOM are normal.  Cardiovascular: Normal rate and regular rhythm.  Pulmonary/Chest:  Decreased breath sounds, no evidence of wheezes, crackles or rales. Exam limited due to  patients inability to follow commands and in wheelchair.   Abdominal: Soft. He exhibits no distension.  Neurological: He is alert.  Patient non-verbal, history of dementia     UC Treatments / Results  Labs (all labs ordered are listed, but only abnormal results are displayed) Labs Reviewed - No data to display  EKG  EKG Interpretation None       Radiology Dg Chest 1  View  Result Date: 04/20/2017 CLINICAL DATA:  Cough and congestion for 1 week. EXAM: CHEST 1 VIEW COMPARISON:  None. FINDINGS: The cardiomediastinal silhouette is unremarkable. Mild bibasilar opacities, right greater than left, may represent pneumonia or atelectasis No pleural effusion, mass, or pneumothorax noted. No acute bony abnormalities are identified. IMPRESSION: Mild bibasilar opacities -question pneumonia versus atelectasis. Electronically Signed   By: Margarette Canada M.D.   On: 04/20/2017 14:53    Procedures Procedures (including critical care time)  Medications Ordered in UC Medications - No data to display   Initial Impression / Assessment and Plan / UC Course  I have reviewed the triage vital signs and the nursing notes.  Pertinent labs & imaging results that were available during my care of the patient were reviewed by me and considered in my medical decision making (see chart for details).    Pneumonia vs. Atelectasis  CXR 1-view obtained instead of 2 view due to him being in wheelchair. Past CXR were unclear on pneumonia vs atelectasis, like today. Given patients age, dementia, decreased eating, possible decreased O2 sat 94% (this may be his normal), and possible pneumonia, recommended to go to ED. Family seemed averse to this based on past and feeling like he isn't too far from baseline. Discussed pros/cons with family to ED. They felt like he would be okay if they took him home. Advised to have a low threshold to bring him to ED if they go home and he beings to decline, eat or drink less than normal.  Since they appeared to prefer to take him home prescription for tessalon and azithromycin were given.    Final Clinical Impressions(s) / UC Diagnoses   Final diagnoses:  Cough    ED Discharge Orders        Ordered    benzonatate (TESSALON) 100 MG capsule  Every 8 hours     04/20/17 1515    azithromycin (ZITHROMAX Z-PAK) 250 MG tablet  Daily     04/20/17 1515       Controlled Substance Prescriptions [place] Controlled Substance Registry consulted? Not Applicable   Janith Lima, [place] 04/20/17 7654

## 2017-04-20 NOTE — ED Triage Notes (Signed)
Pt family member states hes had a cough for five days. Pt has severe dementia.

## 2017-06-04 ENCOUNTER — Encounter (HOSPITAL_COMMUNITY): Payer: Self-pay

## 2017-06-04 ENCOUNTER — Other Ambulatory Visit: Payer: Self-pay

## 2017-06-04 ENCOUNTER — Emergency Department (HOSPITAL_COMMUNITY): Payer: Medicare Other

## 2017-06-04 ENCOUNTER — Inpatient Hospital Stay (HOSPITAL_COMMUNITY)
Admission: EM | Admit: 2017-06-04 | Discharge: 2017-06-25 | DRG: 871 | Disposition: A | Discharge status: Other Acute Inpatient Hospital | Payer: Medicare Other | Attending: Internal Medicine | Admitting: Internal Medicine

## 2017-06-04 DIAGNOSIS — R740 Nonspecific elevation of levels of transaminase and lactic acid dehydrogenase [LDH]: Secondary | ICD-10-CM | POA: Diagnosis not present

## 2017-06-04 DIAGNOSIS — F0280 Dementia in other diseases classified elsewhere without behavioral disturbance: Secondary | ICD-10-CM | POA: Diagnosis present

## 2017-06-04 DIAGNOSIS — Z8673 Personal history of transient ischemic attack (TIA), and cerebral infarction without residual deficits: Secondary | ICD-10-CM

## 2017-06-04 DIAGNOSIS — E1165 Type 2 diabetes mellitus with hyperglycemia: Secondary | ICD-10-CM | POA: Diagnosis present

## 2017-06-04 DIAGNOSIS — Z7189 Other specified counseling: Secondary | ICD-10-CM

## 2017-06-04 DIAGNOSIS — A419 Sepsis, unspecified organism: Secondary | ICD-10-CM | POA: Diagnosis not present

## 2017-06-04 DIAGNOSIS — K9423 Gastrostomy malfunction: Secondary | ICD-10-CM | POA: Diagnosis not present

## 2017-06-04 DIAGNOSIS — R0603 Acute respiratory distress: Secondary | ICD-10-CM | POA: Diagnosis not present

## 2017-06-04 DIAGNOSIS — R05 Cough: Secondary | ICD-10-CM | POA: Diagnosis not present

## 2017-06-04 DIAGNOSIS — M109 Gout, unspecified: Secondary | ICD-10-CM | POA: Diagnosis present

## 2017-06-04 DIAGNOSIS — Z833 Family history of diabetes mellitus: Secondary | ICD-10-CM | POA: Diagnosis not present

## 2017-06-04 DIAGNOSIS — J9600 Acute respiratory failure, unspecified whether with hypoxia or hypercapnia: Secondary | ICD-10-CM | POA: Diagnosis not present

## 2017-06-04 DIAGNOSIS — R4020 Unspecified coma: Secondary | ICD-10-CM | POA: Diagnosis not present

## 2017-06-04 DIAGNOSIS — D72829 Elevated white blood cell count, unspecified: Secondary | ICD-10-CM | POA: Diagnosis not present

## 2017-06-04 DIAGNOSIS — I10 Essential (primary) hypertension: Secondary | ICD-10-CM | POA: Diagnosis not present

## 2017-06-04 DIAGNOSIS — Z452 Encounter for adjustment and management of vascular access device: Secondary | ICD-10-CM

## 2017-06-04 DIAGNOSIS — I248 Other forms of acute ischemic heart disease: Secondary | ICD-10-CM | POA: Diagnosis present

## 2017-06-04 DIAGNOSIS — R6520 Severe sepsis without septic shock: Secondary | ICD-10-CM | POA: Diagnosis present

## 2017-06-04 DIAGNOSIS — K8020 Calculus of gallbladder without cholecystitis without obstruction: Secondary | ICD-10-CM | POA: Diagnosis not present

## 2017-06-04 DIAGNOSIS — G309 Alzheimer's disease, unspecified: Secondary | ICD-10-CM | POA: Diagnosis not present

## 2017-06-04 DIAGNOSIS — L89101 Pressure ulcer of unspecified part of back, stage 1: Secondary | ICD-10-CM | POA: Diagnosis present

## 2017-06-04 DIAGNOSIS — T85598A Other mechanical complication of other gastrointestinal prosthetic devices, implants and grafts, initial encounter: Secondary | ICD-10-CM

## 2017-06-04 DIAGNOSIS — R7401 Elevation of levels of liver transaminase levels: Secondary | ICD-10-CM | POA: Diagnosis present

## 2017-06-04 DIAGNOSIS — Z09 Encounter for follow-up examination after completed treatment for conditions other than malignant neoplasm: Secondary | ICD-10-CM

## 2017-06-04 DIAGNOSIS — R633 Feeding difficulties: Secondary | ICD-10-CM | POA: Diagnosis not present

## 2017-06-04 DIAGNOSIS — G9341 Metabolic encephalopathy: Secondary | ICD-10-CM | POA: Diagnosis not present

## 2017-06-04 DIAGNOSIS — Z9842 Cataract extraction status, left eye: Secondary | ICD-10-CM | POA: Diagnosis not present

## 2017-06-04 DIAGNOSIS — E870 Hyperosmolality and hypernatremia: Secondary | ICD-10-CM | POA: Diagnosis present

## 2017-06-04 DIAGNOSIS — Z8701 Personal history of pneumonia (recurrent): Secondary | ICD-10-CM | POA: Diagnosis not present

## 2017-06-04 DIAGNOSIS — J168 Pneumonia due to other specified infectious organisms: Secondary | ICD-10-CM | POA: Diagnosis not present

## 2017-06-04 DIAGNOSIS — E871 Hypo-osmolality and hyponatremia: Secondary | ICD-10-CM | POA: Diagnosis present

## 2017-06-04 DIAGNOSIS — Z515 Encounter for palliative care: Secondary | ICD-10-CM | POA: Diagnosis not present

## 2017-06-04 DIAGNOSIS — J690 Pneumonitis due to inhalation of food and vomit: Secondary | ICD-10-CM | POA: Diagnosis not present

## 2017-06-04 DIAGNOSIS — J189 Pneumonia, unspecified organism: Secondary | ICD-10-CM | POA: Diagnosis present

## 2017-06-04 DIAGNOSIS — R7989 Other specified abnormal findings of blood chemistry: Secondary | ICD-10-CM

## 2017-06-04 DIAGNOSIS — R945 Abnormal results of liver function studies: Secondary | ICD-10-CM | POA: Diagnosis not present

## 2017-06-04 DIAGNOSIS — G9340 Encephalopathy, unspecified: Secondary | ICD-10-CM | POA: Diagnosis not present

## 2017-06-04 DIAGNOSIS — L8990 Pressure ulcer of unspecified site, unspecified stage: Secondary | ICD-10-CM

## 2017-06-04 DIAGNOSIS — Z4659 Encounter for fitting and adjustment of other gastrointestinal appliance and device: Secondary | ICD-10-CM

## 2017-06-04 DIAGNOSIS — Z66 Do not resuscitate: Secondary | ICD-10-CM | POA: Diagnosis present

## 2017-06-04 DIAGNOSIS — J9601 Acute respiratory failure with hypoxia: Secondary | ICD-10-CM | POA: Diagnosis not present

## 2017-06-04 DIAGNOSIS — N400 Enlarged prostate without lower urinary tract symptoms: Secondary | ICD-10-CM | POA: Diagnosis present

## 2017-06-04 DIAGNOSIS — I5030 Unspecified diastolic (congestive) heart failure: Secondary | ICD-10-CM | POA: Diagnosis not present

## 2017-06-04 DIAGNOSIS — J984 Other disorders of lung: Secondary | ICD-10-CM | POA: Diagnosis not present

## 2017-06-04 DIAGNOSIS — Z0189 Encounter for other specified special examinations: Secondary | ICD-10-CM

## 2017-06-04 DIAGNOSIS — E872 Acidosis: Secondary | ICD-10-CM | POA: Diagnosis present

## 2017-06-04 DIAGNOSIS — I2489 Other forms of acute ischemic heart disease: Secondary | ICD-10-CM | POA: Diagnosis present

## 2017-06-04 DIAGNOSIS — Z4682 Encounter for fitting and adjustment of non-vascular catheter: Secondary | ICD-10-CM | POA: Diagnosis not present

## 2017-06-04 DIAGNOSIS — R918 Other nonspecific abnormal finding of lung field: Secondary | ICD-10-CM | POA: Diagnosis not present

## 2017-06-04 DIAGNOSIS — J90 Pleural effusion, not elsewhere classified: Secondary | ICD-10-CM | POA: Diagnosis not present

## 2017-06-04 DIAGNOSIS — R0602 Shortness of breath: Secondary | ICD-10-CM

## 2017-06-04 DIAGNOSIS — R0600 Dyspnea, unspecified: Secondary | ICD-10-CM

## 2017-06-04 DIAGNOSIS — Z8249 Family history of ischemic heart disease and other diseases of the circulatory system: Secondary | ICD-10-CM

## 2017-06-04 LAB — URINALYSIS, ROUTINE W REFLEX MICROSCOPIC
GLUCOSE, UA: 50 mg/dL — AB
Hgb urine dipstick: NEGATIVE
Ketones, ur: NEGATIVE mg/dL
LEUKOCYTES UA: NEGATIVE
Nitrite: NEGATIVE
PH: 6 (ref 5.0–8.0)
PROTEIN: NEGATIVE mg/dL
Specific Gravity, Urine: 1.016 (ref 1.005–1.030)

## 2017-06-04 LAB — I-STAT CG4 LACTIC ACID, ED
Lactic Acid, Venous: 8.67 mmol/L (ref 0.5–1.9)
Lactic Acid, Venous: 7.94 mmol/L (ref 0.5–1.9)

## 2017-06-04 LAB — COMPREHENSIVE METABOLIC PANEL
ALT: 382 U/L — AB (ref 17–63)
AST: 475 U/L — AB (ref 15–41)
Albumin: 3.5 g/dL (ref 3.5–5.0)
Alkaline Phosphatase: 271 U/L — ABNORMAL HIGH (ref 38–126)
Anion gap: 15 (ref 5–15)
BUN: 17 mg/dL (ref 6–20)
CHLORIDE: 98 mmol/L — AB (ref 101–111)
CO2: 24 mmol/L (ref 22–32)
CREATININE: 1.12 mg/dL (ref 0.61–1.24)
Calcium: 9.2 mg/dL (ref 8.9–10.3)
GFR calc Af Amer: >60 mL/min (ref >60)
GFR calc non Af Amer: 55 mL/min — ABNORMAL LOW (ref >60)
Glucose, Bld: 176 mg/dL — ABNORMAL HIGH (ref 65–99)
Potassium: 4 mmol/L (ref 3.5–5.1)
Sodium: 137 mmol/L (ref 135–145)
Total Bilirubin: 3.9 mg/dL — ABNORMAL HIGH (ref 0.3–1.2)
Total Protein: 7.1 g/dL (ref 6.5–8.1)

## 2017-06-04 LAB — CBC WITH DIFFERENTIAL/PLATELET
BASOS PCT: 0 %
Basophils Absolute: 0 10*3/uL (ref 0.0–0.1)
EOS PCT: 0 %
Eosinophils Absolute: 0 10*3/uL (ref 0.0–0.7)
HEMATOCRIT: 41.2 % (ref 39.0–52.0)
HEMOGLOBIN: 13.2 g/dL (ref 13.0–17.0)
LYMPHS PCT: 3 %
Lymphs Abs: 0.8 10*3/uL (ref 0.7–4.0)
MCH: 23.2 pg — AB (ref 26.0–34.0)
MCHC: 32 g/dL (ref 30.0–36.0)
MCV: 72.4 fL — ABNORMAL LOW (ref 78.0–100.0)
MONOS PCT: 2 %
Monocytes Absolute: 0.5 10*3/uL (ref 0.1–1.0)
NEUTROS ABS: 25.5 10*3/uL — AB (ref 1.7–7.7)
Neutrophils Relative %: 95 %
Platelets: 332 10*3/uL (ref 150–400)
RBC: 5.69 MIL/uL (ref 4.22–5.81)
RDW: 17.4 % — ABNORMAL HIGH (ref 11.5–15.5)
WBC: 26.8 10*3/uL — ABNORMAL HIGH (ref 4.0–10.5)

## 2017-06-04 LAB — PROTIME-INR
INR: 1.18
Prothrombin Time: 14.9 seconds (ref 11.4–15.2)

## 2017-06-04 LAB — INFLUENZA PANEL BY PCR (TYPE A & B)
Influenza A By PCR: NEGATIVE
Influenza B By PCR: NEGATIVE

## 2017-06-04 LAB — BRAIN NATRIURETIC PEPTIDE: B NATRIURETIC PEPTIDE 5: 111.6 pg/mL — AB (ref 0.0–100.0)

## 2017-06-04 LAB — TROPONIN I: TROPONIN I: 0.03 ng/mL — AB (ref <0.03)

## 2017-06-04 MED ORDER — DEXTROSE 5 % IV SOLN: 500.0000 mg | INTRAVENOUS | Status: DC

## 2017-06-04 MED ORDER — SODIUM CHLORIDE 0.9 % IV BOLUS (SEPSIS)
1000.0000 mL | Freq: Once | INTRAVENOUS | Status: AC
  Administered 2017-06-04: 1000 mL via INTRAVENOUS

## 2017-06-04 MED ORDER — VANCOMYCIN HCL IN DEXTROSE 1-5 GM/200ML-% IV SOLN
1000.0000 mg | Freq: Once | INTRAVENOUS | Status: DC
  Filled 2017-06-04: qty 200

## 2017-06-04 MED ORDER — ALBUTEROL SULFATE (2.5 MG/3ML) 0.083% IN NEBU
2.5000 mg | INHALATION_SOLUTION | Freq: Once | RESPIRATORY_TRACT | Status: AC
  Administered 2017-06-04: 2.5 mg via RESPIRATORY_TRACT
  Filled 2017-06-04: qty 3

## 2017-06-04 MED ORDER — ENOXAPARIN SODIUM 40 MG/0.4ML ~~LOC~~ SOLN
40.0000 mg | SUBCUTANEOUS | Status: DC
  Administered 2017-06-05 – 2017-06-24 (×21): 40 mg via SUBCUTANEOUS
  Filled 2017-06-04 (×23): qty 0.4

## 2017-06-04 MED ORDER — DEXTROSE 5 % IV SOLN: 1.0000 g | INTRAVENOUS | Status: DC

## 2017-06-04 MED ORDER — ALBUTEROL SULFATE (2.5 MG/3ML) 0.083% IN NEBU: 5.0000 mg | INHALATION_SOLUTION | Freq: Once | RESPIRATORY_TRACT | Status: DC

## 2017-06-04 MED ORDER — SODIUM CHLORIDE 0.9 % IV SOLN
INTRAVENOUS | Status: DC
  Administered 2017-06-04: 15:00:00 via INTRAVENOUS

## 2017-06-04 MED ORDER — METRONIDAZOLE IN NACL 5-0.79 MG/ML-% IV SOLN
500.0000 mg | Freq: Once | INTRAVENOUS | Status: AC
  Administered 2017-06-04: 500 mg via INTRAVENOUS
  Filled 2017-06-04: qty 100

## 2017-06-04 MED ORDER — PIPERACILLIN-TAZOBACTAM 3.375 G IVPB 30 MIN: 3.3750 g | Freq: Once | INTRAVENOUS | Status: DC

## 2017-06-04 MED ORDER — DEXTROSE 5 % IV SOLN
1.0000 g | Freq: Once | INTRAVENOUS | Status: AC
  Administered 2017-06-04: 1 g via INTRAVENOUS
  Filled 2017-06-04: qty 10

## 2017-06-04 MED ORDER — IPRATROPIUM-ALBUTEROL 0.5-2.5 (3) MG/3ML IN SOLN
3.0000 mL | RESPIRATORY_TRACT | Status: DC | PRN
  Administered 2017-06-05: 3 mL via RESPIRATORY_TRACT
  Filled 2017-06-04: qty 3

## 2017-06-04 MED ORDER — ACETAMINOPHEN 650 MG RE SUPP
650.0000 mg | Freq: Once | RECTAL | Status: AC
  Administered 2017-06-04: 650 mg via RECTAL
  Filled 2017-06-04: qty 1

## 2017-06-04 MED ORDER — DEXTROSE 5 % IV SOLN
500.0000 mg | Freq: Once | INTRAVENOUS | Status: AC
  Administered 2017-06-04: 500 mg via INTRAVENOUS
  Filled 2017-06-04: qty 500

## 2017-06-04 MED ORDER — ACETAMINOPHEN 325 MG PO TABS: 650.0000 mg | ORAL_TABLET | Freq: Once | ORAL | Status: DC

## 2017-06-04 MED ORDER — PIPERACILLIN-TAZOBACTAM 3.375 G IVPB
3.3750 g | Freq: Three times a day (TID) | INTRAVENOUS | Status: DC
  Administered 2017-06-04 – 2017-06-11 (×21): 3.375 g via INTRAVENOUS
  Filled 2017-06-04 (×21): qty 50

## 2017-06-04 MED ORDER — SODIUM CHLORIDE 0.9 % IV BOLUS (SEPSIS)
250.0000 mL | Freq: Once | INTRAVENOUS | Status: AC
  Administered 2017-06-04: 250 mL via INTRAVENOUS

## 2017-06-04 MED ORDER — VANCOMYCIN HCL IN DEXTROSE 1-5 GM/200ML-% IV SOLN: 1000.0000 mg | Freq: Once | INTRAVENOUS | Status: DC

## 2017-06-04 MED ORDER — SODIUM CHLORIDE 0.9 % IV SOLN
INTRAVENOUS | Status: DC
  Administered 2017-06-05: 01:00:00 via INTRAVENOUS

## 2017-06-04 NOTE — ED Triage Notes (Signed)
Patient's family reports that the patient has a productive cough, but swallows. Patient was taken to an UC where he had a CXR completed. Patient has dementia and does not follow commands or answer questions.

## 2017-06-04 NOTE — Progress Notes (Signed)
Pharmacy: ceftriaxone and azithromycin  Patient's a 84 y.o M presented to the ED on 06/04/17 with c/o cough. CXR showed findings consistent with PNA. To start ceftriaxone and azithromycin for CAP.   Plan: - azithromycin 500 mg IV q24h - ceftriaxone 1gm IV q24h - no renal adjustment is needed with the above two abx.  Pharmacy will sign off.  Re-consult [place] if need further assistance.  Dia Sitter, PharmD, BCPS 06/04/2017 11:04 AM

## 2017-06-04 NOTE — Consult Note (Signed)
PULMONARY / CRITICAL CARE MEDICINE   Name: Douglas Mckee MRN: [id number] DOB: Jun 17, 1922    ADMISSION DATE:  06/04/2017 CONSULTATION DATE:  06/04/2017  REFERRING MD:  Doyle Askew  CHIEF COMPLAINT:  Dyspnea  HISTORY OF PRESENT ILLNESS:   84 y/o male with dementia was brought in by family for dyspnea today.  They state that he is non-conversant but walks around some and eats, goes to adult daycare most days.  For the last month he has dealt with a cough with mucus production.  He was treated with antibiotics about a month ago for the same.  He had a persistent cough but in general was doing OK over the last few days.  However this morning he woke up and vomited multiple times, his family says he vomited "a lot".  They state that not long afterwards he started feeling more short of breath with more coughing.  He was brought to the [facility] ER.  Here he was found to be septic and was given IV antibiotics and oxygen.  Because of persistent tachycardia, elevated lactic acid and respiratory distress PCCM was consulted.  After admission he had an ultrasound that showed multiple gallstones.    Here he received 2.25 L Saline bolus.  PAST MEDICAL HISTORY :  He  has a past medical history of AKI (acute kidney injury) ([place]), Alzheimers disease, Anemia of chronic disease, Aspiration pneumonia of right lower lobe ([place]), BPH (benign prostatic hyperplasia), CVA (cerebral infarction), Dysphagia, Gout, Hypertensive urgency, malignant, Influenza A, Kidney infection, Left humeral fracture, Leukocytosis, Metabolic encephalopathy, Microcytic anemia, Physical deconditioning, Pneumonia, PSA elevation, Sepsis ([place]), Thrombocytosis ([place]), TIA (transient ischemic attack), and UTI (lower urinary tract infection).  PAST SURGICAL HISTORY: He  has a past surgical history that includes Cataract extraction (Left, 2012) and Mouth surgery (01/2012).  No Known Allergies  No current facility-administered medications on file prior to encounter.     Current Outpatient Medications on File Prior to Encounter  Medication Sig  . Multiple Vitamin (MULTIVITAMIN WITH MINERALS) TABS Take 1 tablet by mouth every morning.   . vitamin C (ASCORBIC ACID) 500 MG tablet Take 1,000 mg by mouth daily.  Marland Kitchen acetaminophen (TYLENOL) 325 MG tablet Take 2 tablets (650 mg total) by mouth every 6 (six) hours as needed for mild pain (or Fever >/= 101). (Patient not taking: Reported on 06/04/2017)  . albuterol (PROVENTIL HFA;VENTOLIN HFA) 108 (90 Base) MCG/ACT inhaler Inhale 2 puffs into the lungs every 4 (four) hours as needed for wheezing or shortness of breath (or cough). (Patient not taking: Reported on 06/04/2017)  . benzonatate (TESSALON) 100 MG capsule Take 1 capsule (100 mg total) by mouth every 8 (eight) hours. (Patient not taking: Reported on 06/04/2017)  . tamsulosin (FLOMAX) 0.4 MG CAPS Take 1 capsule (0.4 mg total) by mouth daily. (Patient not taking: Reported on 06/04/2017)    FAMILY HISTORY:  His indicated that the status of his sister is unknown. He indicated that the status of his neg hx is unknown.   SOCIAL HISTORY: He  reports that  has never smoked. He does not have any smokeless tobacco history on file. He reports that he does not drink alcohol or use drugs.  REVIEW OF SYSTEMS:   Cannot obtain due to encephalopathy/dementia  SUBJECTIVE:  As above  VITAL SIGNS: BP (!) 150/113   Pulse (!) 126   Temp 100.2 F (37.9 C) (Rectal)   Resp (!) 24   Ht 5\' 5"  (1.651 m)   Wt 75.8 kg (167 lb)  SpO2 95%   BMI 27.79 kg/m   HEMODYNAMICS:    VENTILATOR SETTINGS:    INTAKE / OUTPUT: No intake/output data recorded.  PHYSICAL EXAMINATION:  General:  Lying in bed on stretcher, short of breath HENT: NCAT OP clear PULM: rhonchi, wheezing bilaterally, accessory muscle use B CV: Tachycardia, regular, no mgr GI: Minimal bowel sounds, soft, nontender, no murphy's sign MSK: normal bulk and tone Neuro: asleep, minimal response to external stimuli,  no eye opening to voice or command  LABS:  BMET Recent Labs  Lab 06/04/17 0952  NA 137  K 4.0  CL 98*  CO2 24  BUN 17  CREATININE 1.12  GLUCOSE 176*    Electrolytes Recent Labs  Lab 06/04/17 0952  CALCIUM 9.2    CBC Recent Labs  Lab 06/04/17 0952  WBC 26.8*  HGB 13.2  HCT 41.2  PLT 332    Coag's Recent Labs  Lab 06/04/17 0952  INR 1.18    Sepsis Markers Recent Labs  Lab 06/04/17 1015 06/04/17 1255  LATICACIDVEN 8.67* 7.94*    ABG No results for input(s): PHART, PCO2ART, PO2ART in the last 168 hours.  Liver Enzymes Recent Labs  Lab 06/04/17 0952  AST 475*  ALT 382*  ALKPHOS 271*  BILITOT 3.9*  ALBUMIN 3.5    Cardiac Enzymes Recent Labs  Lab 06/04/17 0952  TROPONINI 0.03*    Glucose No results for input(s): GLUCAP in the last 168 hours.  Imaging Dg Chest 2 View  Result Date: 06/04/2017 CLINICAL DATA:  Shortness of breath, cough, congestion EXAM: CHEST  2 VIEW COMPARISON:  10/17/2016 FINDINGS: Patchy bilateral airspace disease has worsened since prior study concerning for pneumonia. Heart is normal size. No visible effusions or acute bony abnormality. Old left humeral neck fracture, stable. IMPRESSION: Worsening patchy bilateral airspace disease, right greater than left concerning for pneumonia. Electronically Signed   By: Rolm Baptise M.D.   On: 06/04/2017 10:35   US Abdomen Limited Ruq  Result Date: 06/04/2017 CLINICAL DATA:  Elevated LFTs, fever EXAM: ULTRASOUND ABDOMEN LIMITED RIGHT UPPER QUADRANT COMPARISON:  CT 10/18/2012 FINDINGS: Gallbladder: Gallstones fill the gallbladder with extensive shadowing making it difficult to visualize the wall. Negative sonographic Murphy's. Common bile duct: Diameter: Normal caliber, 5 mm Liver: No focal lesion identified. Within normal limits in parenchymal echogenicity. Portal vein is patent on color Doppler imaging with normal direction of blood flow towards the liver. IMPRESSION: Gallbladder filled  with gallstones.  No sonographic Murphy sign. Electronically Signed   By: Rolm Baptise M.D.   On: 06/04/2017 12:11     STUDIES:  1/7 RUQ ultrasound> gallbladder full of stones, difficult to visualize wall of GB, CBD normal diameter  CULTURES: 1/7 Blood >  1/7 Urine >   ANTIBIOTICS: 1/7 azithro > 1/7 1/7 ceftriaxone > 1/7 1/7 flagyl > 1/7 1/7 zosyn >    SIGNIFICANT EVENTS:    LINES/TUBES:  DISCUSSION: 84 y/o male with severe sepsis from aspiration pneumonia in the setting of nausea and vomiting.  The most likely scenario based on labs and imaging is that he passed a gallstone earlier and this lead to nausea and vomiting.  He has acute respiratory failure with hypoxemia.   ASSESSMENT / PLAN:  PULMONARY A: Acute respiratory failure with hypoxemia Aspiration pneumonia P:   Aspiration precautions See ID Family does not want intubation  CARDIOVASCULAR A:  Severe sepsis, not in shock P:  Continue IVF Continue telemetry monitoring Continue to monitor hemodynamics Continue to monitor lactic acid  RENAL A:   No acute issues P:   Monitor BMET and UOP Replace electrolytes as needed   GASTROINTESTINAL A:   Gallstones Elevated LFT's with elevated T Bili> most likely scenario is that he passed a gallstone P:   Consult general surgery> would consider percutaneous gallbladder drain NPO  HEMATOLOGIC A:   No acute issues P:  Monitor for bleeding  INFECTIOUS A:   Severe sepsis Aspiration pneumonia Possible acute cholecystitis P:   Change antibiotics to zosyn alone Follow up cultures  ENDOCRINE A:   No acute issues   P:   Monitor glucose  NEUROLOGIC A:   Acute encephalopathy from sepsis Baseline dementia P:   Avoid sedating medications   FAMILY  - Updates: I had an extensive conversation with 2 of the patient's daughters including the healthcare power of attorney and his grandson.  We talked extensively about the fact that he is critically ill.   I explained that given his respiratory status he likely needs to start life support.  However, they said that he has never taken any medications and he would not want life support.  We talked in detail about cardiopulmonary resuscitation electric shocks and the consequences thereof (broken ribs, further respiratory failure etc.).  After extensive conversation they state that he would not want to have CPR or mechanical ventilation.  However, they do want him to receive IV fluids and antibiotics.  If he does not improve despite those measures then they say that they would take this is an indication that God is calling him home.  - Inter-disciplinary family meet or Palliative Care meeting due by:  day 7  Code status DNR  PCCM will be available PRN  MY cc time 1 hour  Roselie Awkward, MD [place] [facility] Pager: [phone] Cell: [phone] After 3pm or if no response, call [phone]   06/04/2017, 1:51 PM

## 2017-06-04 NOTE — Progress Notes (Signed)
Pharmacy Antibiotic Note  Douglas Mckee is a 84 y.o. male admitted on 06/04/2017 with aspiration pneumonia, cholecystitis.  Pharmacy has been consulted for Zosyn dosing.  Plan: Zosyn 3.375g IV q8h (4 hour infusion time).   Height: 5\' 5"  (165.1 cm) Weight: 167 lb (75.8 kg) IBW/kg (Calculated) : 61.5  Temp (24hrs), Avg:101.1 F (38.4 C), Min:100.2 F (37.9 C), Max:101.9 F (38.8 C)  Recent Labs  Lab 06/04/17 0952 06/04/17 1015 06/04/17 1255  WBC 26.8*  --   --   CREATININE 1.12  --   --   LATICACIDVEN  --  8.67* 7.94*    Estimated Creatinine Clearance: 40 mL/min (by C-G formula based on SCr of 1.12 mg/dL).    No Known Allergies  Antimicrobials this admission: 1/7 azithromycin x 1 1/7 ceftriaxone x 1 1/7 Flagyl x 1 1/7 Zosyn >>   Dose adjustments this admission:  Microbiology results: 1/7 BCx:   Thank you for allowing pharmacy to be a part of this patient's care.  Hershal Coria 06/04/2017 2:33 PM

## 2017-06-04 NOTE — H&P (Addendum)
History and Physical    Douglas Mckee HMC:[id number] DOB: [birth year] DOA: 06/04/2017  Referring MD/NP/PA: Dr. Kathrynn Humble   PCP: Verline Lema, MD   Patient coming from: home  Chief Complaint: dyspnea   HPI: Douglas Mckee is a 84 y.o. male with medical history significant of dementia, BPH, HTN, Gout, previous TIA, now presents to [facility] ED with main concern of several days duration of progressively worsening dyspnea that initially started with exertion and has progressed to dyspnea at rest. Please note that hx is provided by son at bedside. Son explains that pt was having URI for the past couple of months and could not seem to get over it. Pt was also having cough and per son, the cough was mostly productive of clear and yellow phlegm. He explains other than dementia, pt takes no medications. At baseline patient is not conversant however able to ambulate.  ED Course:  Vital signs - T 101.4F, HR up to 126, RR up to 24, BP 150/113 Blood work - WBC 26.8, troponin 0.03, lactic acid 8.67 --> 7.94, AST 475, ALT 382, Alk Phosph 271, bili 3.9 CXR - bilateral airspace disease   Patient has received Zithromax, Rocephin, Flagyl in emergency department. TRH asked to admit to step down unit for further evaluation.  Review of Systems:  Unable to obtain due to altered mental status   Past Medical History:  Diagnosis Date  . AKI (acute kidney injury) ([place])   . Alzheimers disease   . Anemia of chronic disease   . Aspiration pneumonia of right lower lobe ([place])   . BPH (benign prostatic hyperplasia)   . CVA (cerebral infarction)   . Dysphagia   . Gout   . Hypertensive urgency, malignant   . Influenza A   . Kidney infection   . Left humeral fracture   . Leukocytosis   . Metabolic encephalopathy   . Microcytic anemia   . Physical deconditioning   . Pneumonia   . PSA elevation   . Sepsis ([place])   . Thrombocytosis ([place])   . TIA (transient ischemic attack)   . UTI (lower urinary tract infection)      Past Surgical History:  Procedure Laterality Date  . CATARACT EXTRACTION Left 2012  . MOUTH SURGERY  01/2012   tooth extractions and bone shaved    Social Hx:  reports that  has never smoked. He does not have any smokeless tobacco history on file. He reports that he does not drink alcohol or use drugs.  No Known Allergies  Family History  Problem Relation Age of Onset  . High blood pressure Sister   . Diabetes Neg Hx     Medication Sig  acetaminophen (TYLENOL) 325 MG tablet Take 2 tablets (650 mg total) by mouth every 6 (six) hours as needed for mild pain (or Fever >/= 101).  tamsulosin (FLOMAX) 0.4 MG CAPS Take 1 capsule (0.4 mg total) by mouth daily. Patient not taking: Reported on 06/04/2017   Physical Exam: Vitals:   06/04/17 1100 06/04/17 1130 06/04/17 1200 06/04/17 1324  BP: (!) 161/79 (!) 129/109 (!) 150/113   Pulse:   (!) 126   Resp: (!) 25 (!) 24 (!) 24   Temp:    100.2 F (37.9 C)  TempSrc:    Rectal  SpO2:   95%   Weight:      Height:        Constitutional: elderly, frail male Vitals:   06/04/17 1100 06/04/17 1130 06/04/17 1200 06/04/17 1324  BP: (!) 161/79 (!) 129/109 (!) 150/113   Pulse:   (!) 126   Resp: (!) 25 (!) 24 (!) 24   Temp:    100.2 F (37.9 C)  TempSrc:    Rectal  SpO2:   95%   Weight:      Height:       Eyes: PERRL ENMT: Mucous membranes are moist. edentulous Neck: normal, supple, no masses, no thyromegaly Respiratory: bilateral rhonchi, tachypnea, wheezing Cardiovascular: tachycardic, no murmurs noted Abdomen: no tenderness, no masses palpated. No hepatosplenomegaly.  Musculoskeletal: no clubbing / cyanosis. No joint deformity upper and lower extremities.  Skin: no rashes, lesions, ulcers. No induration Neurologic: patient is somnolent,not opening eyes, not following any commands at this time, occasionally moving extremities spontaneously Psychiatric: difficult to assess due to altered mental status  Labs on Admission: I have  personally reviewed following labs and imaging studies  CBC: Recent Labs  Lab 06/04/17 0952  WBC 26.8*  NEUTROABS 25.5*  HGB 13.2  HCT 41.2  MCV 72.4*  PLT 875   Basic Metabolic Panel: Recent Labs  Lab 06/04/17 0952  NA 137  K 4.0  CL 98*  CO2 24  GLUCOSE 176*  BUN 17  CREATININE 1.12  CALCIUM 9.2   Liver Function Tests: Recent Labs  Lab 06/04/17 0952  AST 475*  ALT 382*  ALKPHOS 271*  BILITOT 3.9*  PROT 7.1  ALBUMIN 3.5   Coagulation Profile: Recent Labs  Lab 06/04/17 0952  INR 1.18   Cardiac Enzymes: Recent Labs  Lab 06/04/17 0952  TROPONINI 0.03*   Urine analysis:    Component Value Date/Time   COLORURINE YELLOW 08/06/2015 1533   APPEARANCEUR CLOUDY (A) 08/06/2015 1533   LABSPEC 1.028 08/06/2015 1533   PHURINE 5.0 08/06/2015 1533   GLUCOSEU NEGATIVE 08/06/2015 1533   HGBUR LARGE (A) 08/06/2015 1533   BILIRUBINUR NEGATIVE 08/06/2015 1533   KETONESUR NEGATIVE 08/06/2015 1533   PROTEINUR 100 (A) 08/06/2015 1533   UROBILINOGEN 0.2 10/22/2012 1940   NITRITE NEGATIVE 08/06/2015 1533   LEUKOCYTESUR NEGATIVE 08/06/2015 1533    Radiological Exams on Admission: Dg Chest 2 View  Result Date: 06/04/2017 CLINICAL DATA:  Shortness of breath, cough, congestion EXAM: CHEST  2 VIEW COMPARISON:  10/17/2016 FINDINGS: Patchy bilateral airspace disease has worsened since prior study concerning for pneumonia. Heart is normal size. No visible effusions or acute bony abnormality. Old left humeral neck fracture, stable. IMPRESSION: Worsening patchy bilateral airspace disease, right greater than left concerning for pneumonia. Electronically Signed   By: Rolm Baptise M.D.   On: 06/04/2017 10:35   US Abdomen Limited Ruq  Result Date: 06/04/2017 CLINICAL DATA:  Elevated LFTs, fever EXAM: ULTRASOUND ABDOMEN LIMITED RIGHT UPPER QUADRANT COMPARISON:  CT 10/18/2012 FINDINGS: Gallbladder: Gallstones fill the gallbladder with extensive shadowing making it difficult to visualize  the wall. Negative sonographic Murphy's. Common bile duct: Diameter: Normal caliber, 5 mm Liver: No focal lesion identified. Within normal limits in parenchymal echogenicity. Portal vein is patent on color Doppler imaging with normal direction of blood flow towards the liver. IMPRESSION: Gallbladder filled with gallstones.  No sonographic Murphy sign. Electronically Signed   By: Rolm Baptise M.D.   On: 06/04/2017 12:11    EKG: pending   Assessment/Plan  Principal Problem:   Acute respiratory distress - secondary to bilateral PNA - admission to SDU as pt is at high risk for clinical deterioration including placement on life support - goals of care discussed with family by Dr. Lake Bells, appreciate assistance -  Patient now DO NOT RESUSCITATE, family okay with continuing supportive measures and follow clinical response - follow up on influenza, resp viarl panel, sputum cultures, strep pneumo and urine legionella - bronchodilators as needed  - oxygen as needed  Active Problems:   Metabolic encephalopathy - secondary to acute illness imposed on known dementia  - provide conservative care as noted above - Follow clinical progress - Family very clear that if patient continues to decline, transition to comfort is preferred    Sepsis due to pneumonia ([place]) - severe sepsis - order set in place - follow up on sputum and blood cultures, follow lactic acid and procalcitonin  - follow-up on influenza, respiratory viral panel, strep pneumo and urine Legionella - Placed on vancomycin and Zosyn - Monitor in step down unit    Transaminitis - unclear etiology, no tenderness on exam noted - Question if patient passed gallstone, acute cholecystitis  - pt already on ABX - discussed with surgery, plan for repeat CMET in AM and if LFT's still up or worse, can consider HIDA scan or MRCP     Demand ischemia ([place]) - secondary to severe sepsis - No need for further cardiac enzymes monitoring as family is  very clear that only conservative measures to be provided for now  DVT prophylaxis: Lovenox SQ Code Status: Full  Family Communication:  Disposition Plan: to be determined  Consults called: PCCM, surgery team (spoke with surgery over the phone) Admission status: Inpatient   This patient is critically ill with severe sepsis, acute respiratory failure secondary to bilateral pneumonia, requires admission to step down unit and initiation of broad-spectrum antibiotics. Dr. Lake Bells address goals of care with family, patient is DO NOT RESUSCITATE. Family is okay with continuing current measures including intravenous antibiotics as well as fluids. Family is also very clear that if patient does not respond to current clinical measures, transition to comfort care is desired. Appreciate assistance of critical care team.  Faye Ramsay MD [facility] Pager [phone]  If 7PM-7AM, please contact night-coverage [email or web address] Password [email or web address]  06/04/2017, 1:26 PM

## 2017-06-04 NOTE — ED Triage Notes (Signed)
Patient febrile in triage. Patient has audible course crackles in bilateral lungs. Patient tachycardic in triage. HOB 45 degrees. Patient placed on [employer] for low oxygen saturations at 90% on RA

## 2017-06-04 NOTE — ED Notes (Signed)
NP and RN aware of lactic acid 8.67

## 2017-06-04 NOTE — Progress Notes (Signed)
A consult was received from an ED physician for vancomycin per pharmacy dosing.  The patient's profile has been reviewed for ht/wt/allergies/indication/available labs.    A one time order has been placed for vancomycin 1gm IV x1.  Further antibiotics/pharmacy consults should be ordered by admitting physician if indicated.                       Thank you, Lynelle Doctor 06/04/2017  11:10 AM

## 2017-06-04 NOTE — ED Notes (Signed)
Bed: WA06 Expected date:  Expected time:  Means of arrival:  Comments: 

## 2017-06-04 NOTE — ED Provider Notes (Signed)
[facility] DEPT Provider Note   CSN: [id number] Arrival date & time: 06/04/17  0909     History   Chief Complaint Chief Complaint  Patient presents with  . Shortness of Breath    HPI   Blood pressure 140/76, pulse (!) 128, temperature (!) 101.9 F (38.8 C), temperature source Oral, resp. rate (!) 34, SpO2 93 %.  Douglas Mckee is a 84 y.o. male complaining of tactile fever, shortness of breath, single episode of emesis that this morning.  History is provided by aunt and son.  Level 5 caveat secondary to dementia.  As per son, patient has had a upper respiratory infection over the last couple of months and he cannot shake it.  No recent hospitalizations, he has not been complaining of any pain.  Aside from dementia this patient has no chronic medical conditions and does not take any medication aside from prostate medication regularly. No cardiac issues.   He has received his flu shot this year.  Past Medical History:  Diagnosis Date  . AKI (acute kidney injury) ([place])   . Alzheimers disease   . Anemia of chronic disease   . Aspiration pneumonia of right lower lobe ([place])   . BPH (benign prostatic hyperplasia)   . CVA (cerebral infarction)   . Dysphagia   . Gout   . Hypertensive urgency, malignant   . Influenza A   . Kidney infection   . Left humeral fracture   . Leukocytosis   . Metabolic encephalopathy   . Microcytic anemia   . Physical deconditioning   . Pneumonia   . PSA elevation   . Sepsis ([place])   . Thrombocytosis ([place])   . TIA (transient ischemic attack)   . UTI (lower urinary tract infection)     Patient Active Problem List   Diagnosis Date Noted  . Cough 11/07/2016  . Pneumonia 08/06/2015  . Sepsis ([place]) 08/06/2015  . AKI (acute kidney injury) ([place]) 08/06/2015  . Left humeral fracture 08/06/2015  . Dementia 10/21/2012  . PSA elevation 10/20/2012  . Hypertensive urgency, malignant 10/18/2012  . UTI (lower urinary tract infection)  10/18/2012  . Metabolic encephalopathy 58/01/9832  . TIA (transient ischemic attack) 10/18/2012  . Microcytic anemia 10/18/2012  . BPH (benign prostatic hyperplasia) 10/18/2012  . CVA, old, cognitive deficits 10/18/2012  . Alzheimer's dementia 10/18/2012    Past Surgical History:  Procedure Laterality Date  . CATARACT EXTRACTION Left 2012  . MOUTH SURGERY  01/2012   tooth extractions and bone shaved        Home Medications    Prior to Admission medications   Medication Sig Start Date End Date Taking? Authorizing Provider  Multiple Vitamin (MULTIVITAMIN WITH MINERALS) TABS Take 1 tablet by mouth every morning.    Yes [provider]  vitamin C (ASCORBIC ACID) 500 MG tablet Take 1,000 mg by mouth daily.   Yes [provider]  acetaminophen (TYLENOL) 325 MG tablet Take 2 tablets (650 mg total) by mouth every 6 (six) hours as needed for mild pain (or Fever >/= 101). Patient not taking: Reported on 06/04/2017 08/12/15   Robbie Lis, MD  albuterol (PROVENTIL HFA;VENTOLIN HFA) 108 [phone] Base) MCG/ACT inhaler Inhale 2 puffs into the lungs every 4 (four) hours as needed for wheezing or shortness of breath (or cough). Patient not taking: Reported on 06/04/2017 12/10/15   Wynona Luna, MD  benzonatate (TESSALON) 100 MG capsule Take 1 capsule (100 mg total) by mouth every 8 (  eight) hours. Patient not taking: Reported on 06/04/2017 04/20/17   Wieters, Madelynn Done C, PA-C  tamsulosin (FLOMAX) 0.4 MG CAPS Take 1 capsule (0.4 mg total) by mouth daily. Patient not taking: Reported on 06/04/2017 10/20/12   Domenic Polite, MD    Family History Family History  Problem Relation Age of Onset  . High blood pressure Sister   . Diabetes Neg Hx     Social History Social History   Tobacco Use  . Smoking status: Never Smoker  Substance Use Topics  . Alcohol use: No  . Drug use: No     Allergies   Patient has no known allergies.   Review of Systems Review of Systems  Unable  to perform ROS: Dementia     Physical Exam Updated Vital Signs BP 140/76 (BP Location: Left Arm)   Pulse (!) 118   Temp (!) 101.9 F (38.8 C) (Oral)   Resp (!) 28   Ht 5\' 5"  (1.651 m)   Wt 75.8 kg (167 lb)   SpO2 95%   BMI 27.79 kg/m   Physical Exam  Constitutional: He is oriented to person, place, and time. He appears well-developed and well-nourished. No distress.  HENT:  Head: Normocephalic and atraumatic.  Mouth/Throat: Oropharynx is clear and moist.  Mildly dry mucous membranes  Eyes: Conjunctivae and EOM are normal.  Irregular pupil shape bilaterally, pupils reactive  Neck: Normal range of motion.  Cardiovascular: Intact distal pulses.  Tachycardic, regular  Pulmonary/Chest:  Coarse breath sounds with rhonchi bilaterally  Abdominal: Soft. There is no tenderness.  Musculoskeletal: Normal range of motion.  Neurological: He is alert and oriented to person, place, and time.  Skin: He is not diaphoretic.  Psychiatric: He has a normal mood and affect.  Nursing note and vitals reviewed.    ED Treatments / Results  Labs (all labs ordered are listed, but only abnormal results are displayed) Labs Reviewed  COMPREHENSIVE METABOLIC PANEL - Abnormal; Notable for the following components:      Result Value   Chloride 98 (*)    Glucose, Bld 176 (*)    AST 475 (*)    ALT 382 (*)    Alkaline Phosphatase 271 (*)    Total Bilirubin 3.9 (*)    GFR calc non Af Amer 55 (*)    All other components within normal limits  CBC WITH DIFFERENTIAL/PLATELET - Abnormal; Notable for the following components:   WBC 26.8 (*)    MCV 72.4 (*)    MCH 23.2 (*)    RDW 17.4 (*)    All other components within normal limits  I-STAT CG4 LACTIC ACID, ED - Abnormal; Notable for the following components:   Lactic Acid, Venous 8.67 (*)    All other components within normal limits  CULTURE, BLOOD (ROUTINE X 2)  CULTURE, BLOOD (ROUTINE X 2)  URINE CULTURE  PROTIME-INR  URINALYSIS, ROUTINE W  REFLEX MICROSCOPIC  INFLUENZA PANEL BY PCR (TYPE A & B)  TROPONIN I  BRAIN NATRIURETIC PEPTIDE    EKG  EKG Interpretation None       Radiology Dg Chest 2 View  Result Date: 06/04/2017 CLINICAL DATA:  Shortness of breath, cough, congestion EXAM: CHEST  2 VIEW COMPARISON:  10/17/2016 FINDINGS: Patchy bilateral airspace disease has worsened since prior study concerning for pneumonia. Heart is normal size. No visible effusions or acute bony abnormality. Old left humeral neck fracture, stable. IMPRESSION: Worsening patchy bilateral airspace disease, right greater than left concerning for pneumonia. Electronically Signed  By: Rolm Baptise M.D.   On: 06/04/2017 10:35    Procedures Procedures (including critical care time)  CRITICAL CARE Performed by: Elmyra Ricks Pisciotta   Total critical care time: 35 minutes  Critical care time was exclusive of separately billable procedures and treating other patients.  Critical care was necessary to treat or prevent imminent or life-threatening deterioration.  Critical care was time spent personally by me on the following activities: development of treatment plan with patient and/or surrogate as well as nursing, discussions with consultants, evaluation of patient's response to treatment, examination of patient, obtaining history from patient or surrogate, ordering and performing treatments and interventions, ordering and review of laboratory studies, ordering and review of radiographic studies, pulse oximetry and re-evaluation of patient's condition.   Medications Ordered in ED Medications  sodium chloride 0.9 % bolus 1,000 mL (1,000 mLs Intravenous New Bag/Given 06/04/17 1020)    And  sodium chloride 0.9 % bolus 1,000 mL (1,000 mLs Intravenous New Bag/Given 06/04/17 1020)    And  sodium chloride 0.9 % bolus 250 mL (not administered)  cefTRIAXone (ROCEPHIN) 1 g in dextrose 5 % 50 mL IVPB (1 g Intravenous New Bag/Given 06/04/17 1058)  azithromycin  (ZITHROMAX) 500 mg in dextrose 5 % 250 mL IVPB (not administered)  acetaminophen (TYLENOL) suppository 650 mg (not administered)  albuterol (PROVENTIL) (2.5 MG/3ML) 0.083% nebulizer solution 2.5 mg (2.5 mg Nebulization Given 06/04/17 1050)     Initial Impression / Assessment and Plan / ED Course  I have reviewed the triage vital signs and the nursing notes.  Pertinent labs & imaging results that were available during my care of the patient were reviewed by me and considered in my medical decision making (see chart for details).     Vitals:   06/04/17 0943 06/04/17 1051 06/04/17 1054  BP: 140/76    Pulse: (!) 128 (!) 118   Resp: (!) 34 (!) 28   Temp: (!) 101.9 F (38.8 C)    TempSrc: Oral    SpO2: 93% 95%   Weight:   75.8 kg (167 lb)  Height:   5\' 5"  (1.651 m)    Medications  sodium chloride 0.9 % bolus 1,000 mL (1,000 mLs Intravenous New Bag/Given 06/04/17 1020)    And  sodium chloride 0.9 % bolus 1,000 mL (1,000 mLs Intravenous New Bag/Given 06/04/17 1020)    And  sodium chloride 0.9 % bolus 250 mL (not administered)  cefTRIAXone (ROCEPHIN) 1 g in dextrose 5 % 50 mL IVPB (1 g Intravenous New Bag/Given 06/04/17 1058)  azithromycin (ZITHROMAX) 500 mg in dextrose 5 % 250 mL IVPB (not administered)  acetaminophen (TYLENOL) suppository 650 mg (not administered)  albuterol (PROVENTIL) (2.5 MG/3ML) 0.083% nebulizer solution 2.5 mg (2.5 mg Nebulization Given 06/04/17 1050)    Douglas Mckee is 84 y.o. male presenting with fever, cough, shortness of breath, tachypnea and tachycardia.  Code sepsis initiated, likely pneumonia.  Lung sounds coarse with rhonchi bilaterally.  Broad-spectrum antibiotics initiated, patient with no heart issues, full 30 cc/kg bolus.  Lactic acid significantly elevated at 8.67.  White count of 26.8.  Possible shock liver with transaminitis AST 475 ALT 382.  Shared with attending Dr. not a body who assumes care.  We have discussed antibiotic choice and given the clear  pneumonia source of his infection will give Rocephin and azithromycin.  12:42 PM  MD ATTESTATION:  Sepsis reassessment completed.  84 year old male with no history of lung or cardiac disease comes in with chief complaint of respiratory  distress.  Patient's family reports that patient has been having some cough, raspy breath sounds for the past couple of weeks, however this morning patient had emesis followed by respiratory distress.  Patient arrives to the ER with tachycardia, tachypnea, fevers.  Patient has mild respiratory distress.  Patient is not hypoxic.  Sepsis protocol initiated, initial lactic is greater than 4.  Part of the lactic acidosis is likely because of work of breathing.  Patient started on broad-spectrum antibiotics and given 30 cc/kg fluid.  Labs also indicate elevated LFTs, including bilirubin.  Patient has no pitting edema, this does not appear to be hepatic congestion.  Ultrasound abdomen ordered to ensure there was no cholelithiasis which caused nausea and emesis this morning.  Ultrasound is reassuring overall, and the exam does not reveal any specific right upper quadrant tenderness.  Repeat lactate will be completed shortly.  Patient has received at least 2 L of IV fluid in the initial antibiotics.  Patient has received ceftriaxone, vancomycin, Flagyl.  The latter was added because of concerns for aspiration.  Family reports that around Thanksgiving patient had received antibiotics, therefore there is small risk for multidrug resistance bacteria.  Otherwise patient has not had any history of drug resistant pneumonia, or admission to the hospital within the last 90 days.   CRITICAL CARE Performed by: Muad Noga   Total critical care time: 45 minutes  Critical care time was exclusive of separately billable procedures and treating other patients.  Critical care was necessary to treat or prevent imminent or life-threatening deterioration.  Critical care was time spent  personally by me on the following activities: development of treatment plan with patient and/or surrogate as well as nursing, discussions with consultants, evaluation of patient's response to treatment, examination of patient, obtaining history from patient or surrogate, ordering and performing treatments and interventions, ordering and review of laboratory studies, ordering and review of radiographic studies, pulse oximetry and re-evaluation of patient's condition.    Final Clinical Impressions(s) / ED Diagnoses   Final diagnoses:  Pneumonia of both lungs due to infectious organism, unspecified part of lung  Sepsis, due to unspecified organism [facility])    ED Discharge Orders    None       Waynetta Pean 06/04/17 1104    Varney Biles, MD 06/04/17 1246

## 2017-06-05 ENCOUNTER — Encounter (HOSPITAL_COMMUNITY): Payer: Self-pay | Admitting: *Deleted

## 2017-06-05 ENCOUNTER — Inpatient Hospital Stay (HOSPITAL_COMMUNITY): Payer: Medicare Other

## 2017-06-05 ENCOUNTER — Other Ambulatory Visit: Payer: Self-pay

## 2017-06-05 DIAGNOSIS — J189 Pneumonia, unspecified organism: Secondary | ICD-10-CM | POA: Diagnosis present

## 2017-06-05 DIAGNOSIS — J9601 Acute respiratory failure with hypoxia: Secondary | ICD-10-CM

## 2017-06-05 DIAGNOSIS — R740 Nonspecific elevation of levels of transaminase and lactic acid dehydrogenase [LDH]: Secondary | ICD-10-CM

## 2017-06-05 DIAGNOSIS — I248 Other forms of acute ischemic heart disease: Secondary | ICD-10-CM

## 2017-06-05 DIAGNOSIS — G9340 Encephalopathy, unspecified: Secondary | ICD-10-CM

## 2017-06-05 LAB — LACTIC ACID, PLASMA
Lactic Acid, Venous: 6.4 mmol/L (ref 0.5–1.9)
Lactic Acid, Venous: 5.6 mmol/L (ref 0.5–1.9)

## 2017-06-05 LAB — COMPREHENSIVE METABOLIC PANEL
ALBUMIN: 3.2 g/dL — AB (ref 3.5–5.0)
ALK PHOS: 194 U/L — AB (ref 38–126)
ALT: 231 U/L — ABNORMAL HIGH (ref 17–63)
ALT: 218 U/L — ABNORMAL HIGH (ref 17–63)
ANION GAP: 13 (ref 5–15)
ANION GAP: 13 (ref 5–15)
AST: 225 U/L — ABNORMAL HIGH (ref 15–41)
AST: 192 U/L — ABNORMAL HIGH (ref 15–41)
Albumin: 3.3 g/dL — ABNORMAL LOW (ref 3.5–5.0)
Alkaline Phosphatase: 204 U/L — ABNORMAL HIGH (ref 38–126)
BILIRUBIN TOTAL: 4.9 mg/dL — AB (ref 0.3–1.2)
BILIRUBIN TOTAL: 5 mg/dL — AB (ref 0.3–1.2)
BUN: 18 mg/dL (ref 6–20)
BUN: 20 mg/dL (ref 6–20)
CALCIUM: 8.9 mg/dL (ref 8.9–10.3)
CO2: 19 mmol/L — ABNORMAL LOW (ref 22–32)
CO2: 21 mmol/L — ABNORMAL LOW (ref 22–32)
Calcium: 8.9 mg/dL (ref 8.9–10.3)
Chloride: 104 mmol/L (ref 101–111)
Chloride: 103 mmol/L (ref 101–111)
Creatinine, Ser: 0.84 mg/dL (ref 0.61–1.24)
Creatinine, Ser: 0.91 mg/dL (ref 0.61–1.24)
GFR calc Af Amer: >60 mL/min (ref >60)
GFR calc non Af Amer: >60 mL/min (ref >60)
GLUCOSE: 144 mg/dL — AB (ref 65–99)
Glucose, Bld: 141 mg/dL — ABNORMAL HIGH (ref 65–99)
POTASSIUM: 4.2 mmol/L (ref 3.5–5.1)
Potassium: 4.2 mmol/L (ref 3.5–5.1)
SODIUM: 137 mmol/L (ref 135–145)
Sodium: 136 mmol/L (ref 135–145)
TOTAL PROTEIN: 6.6 g/dL (ref 6.5–8.1)
Total Protein: 6.9 g/dL (ref 6.5–8.1)

## 2017-06-05 LAB — GLUCOSE, CAPILLARY
GLUCOSE-CAPILLARY: 72 mg/dL (ref 65–99)
Glucose-Capillary: 80 mg/dL (ref 65–99)
Glucose-Capillary: 72 mg/dL (ref 65–99)

## 2017-06-05 LAB — CBC
HCT: 41.6 % (ref 39.0–52.0)
HEMOGLOBIN: 13.2 g/dL (ref 13.0–17.0)
MCH: 23 pg — ABNORMAL LOW (ref 26.0–34.0)
MCHC: 31.7 g/dL (ref 30.0–36.0)
MCV: 72.3 fL — ABNORMAL LOW (ref 78.0–100.0)
Platelets: 265 10*3/uL (ref 150–400)
RBC: 5.75 MIL/uL (ref 4.22–5.81)
RDW: 18 % — ABNORMAL HIGH (ref 11.5–15.5)
WBC: 28 10*3/uL — ABNORMAL HIGH (ref 4.0–10.5)

## 2017-06-05 LAB — PROCALCITONIN: Procalcitonin: 63.16 ng/mL

## 2017-06-05 LAB — URINE CULTURE: Culture: NO GROWTH

## 2017-06-05 LAB — MRSA PCR SCREENING: MRSA BY PCR: NEGATIVE

## 2017-06-05 LAB — PROTIME-INR
INR: 1.41
PROTHROMBIN TIME: 17.1 s — AB (ref 11.4–15.2)

## 2017-06-05 LAB — APTT: aPTT: 30 seconds (ref 24–36)

## 2017-06-05 LAB — STREP PNEUMONIAE URINARY ANTIGEN: STREP PNEUMO URINARY ANTIGEN: NEGATIVE

## 2017-06-05 LAB — HIV ANTIBODY (ROUTINE TESTING W REFLEX): HIV Screen 4th Generation wRfx: NONREACTIVE

## 2017-06-05 MED ORDER — SODIUM CHLORIDE 0.9 % IV BOLUS (SEPSIS): 1000.0000 mL | Freq: Once | INTRAVENOUS | Status: DC

## 2017-06-05 MED ORDER — DEXTROSE 5 % IV SOLN
INTRAVENOUS | Status: DC
  Administered 2017-06-05 – 2017-06-07 (×3): via INTRAVENOUS

## 2017-06-05 MED ORDER — VANCOMYCIN HCL IN DEXTROSE 1-5 GM/200ML-% IV SOLN
1000.0000 mg | Freq: Every day | INTRAVENOUS | Status: DC
  Administered 2017-06-05: 1000 mg via INTRAVENOUS
  Filled 2017-06-05: qty 200

## 2017-06-05 MED ORDER — CHLORHEXIDINE GLUCONATE 0.12 % MT SOLN
15.0000 mL | Freq: Two times a day (BID) | OROMUCOSAL | Status: DC
  Administered 2017-06-05 – 2017-06-25 (×39): 15 mL via OROMUCOSAL
  Filled 2017-06-05 (×37): qty 15

## 2017-06-05 MED ORDER — FAMOTIDINE IN NACL 20-0.9 MG/50ML-% IV SOLN
20.0000 mg | Freq: Two times a day (BID) | INTRAVENOUS | Status: DC
  Administered 2017-06-05 – 2017-06-25 (×39): 20 mg via INTRAVENOUS
  Filled 2017-06-05 (×44): qty 50

## 2017-06-05 MED ORDER — LEVALBUTEROL HCL 0.63 MG/3ML IN NEBU
0.6300 mg | INHALATION_SOLUTION | Freq: Four times a day (QID) | RESPIRATORY_TRACT | Status: DC
  Administered 2017-06-06 – 2017-06-11 (×24): 0.63 mg via RESPIRATORY_TRACT
  Filled 2017-06-05 (×24): qty 3

## 2017-06-05 MED ORDER — ORAL CARE MOUTH RINSE
15.0000 mL | Freq: Two times a day (BID) | OROMUCOSAL | Status: DC
  Administered 2017-06-05 – 2017-06-25 (×35): 15 mL via OROMUCOSAL

## 2017-06-05 MED ORDER — FUROSEMIDE 10 MG/ML IJ SOLN
40.0000 mg | Freq: Four times a day (QID) | INTRAMUSCULAR | Status: AC
  Administered 2017-06-05 (×2): 40 mg via INTRAVENOUS
  Filled 2017-06-05 (×2): qty 4

## 2017-06-05 MED ORDER — LABETALOL HCL 5 MG/ML IV SOLN
10.0000 mg | INTRAVENOUS | Status: AC | PRN
  Administered 2017-06-05 – 2017-06-10 (×10): 10 mg via INTRAVENOUS
  Filled 2017-06-05 (×8): qty 4

## 2017-06-05 MED ORDER — IPRATROPIUM-ALBUTEROL 0.5-2.5 (3) MG/3ML IN SOLN
3.0000 mL | Freq: Four times a day (QID) | RESPIRATORY_TRACT | Status: DC
  Administered 2017-06-05 (×3): 3 mL via RESPIRATORY_TRACT
  Filled 2017-06-05 (×3): qty 3

## 2017-06-05 NOTE — Progress Notes (Signed)
CRITICAL VALUE ALERT  Critical Value:  Lactic acid 6.3  Date & Time Notied:  06/05/16 0153  Provider Notified: Lamar Blinks, NP  Orders Received/Actions taken:

## 2017-06-05 NOTE — Progress Notes (Signed)
PROGRESS NOTE    Douglas Mckee  HKV:[id number] DOB: Mar 30, 1923 DOA: 06/04/2017 PCP: Verline Lema, MD   Brief Narrative: 84 year old male with history significant for BPH, hypertension, gout, previous TIA, dementia presented to the ER with several days of progressively worsening dyspnea.  In the ER patient was febrile to 101.9, tachycardia, respiratory distress.  Elevated lactic acid level of 8.6, transaminitis, leukocytosis and elevated troponin level.  Chest x-ray with bilateral airspace disease.  Started on antibiotics.  Critical care was consulted and admitted in a stepdown unit.  Assessment & Plan:  #Acute respiratory distress/bibasal airspace disease likely pneumonia/acute pulmonary edema: -Patient with worsening respiratory status.  Currently on BiPAP 100% oxygen.  As per patient's family member including grandson no aggressive measures and no intubation.  Discussed with the pulmonary critical care team.  -Notes clinical improvement. -Palliative care consulted. -Continue current care including antibiotics for another 24 hours.  If no improvement consider comfort care.  Family agrees.  #Severe sepsis due to pneumonia: Currently on broad-spectrum antibiotics.  Follow-up culture results.  #Transaminitis with cholelithiasis: Repeat labs trending down.  Already on broad-spectrum antibiotics.  Not a candidate for  surgical intervention.  #Demand ischemia in the setting of sepsis.  #Acute metabolic encephalopathy in patient with history of dementia likely age related: Patient was on BiPAP and not responding well.  Family reported that he was not sleeping last night and mostly very tired.  Continue supportive care.  On n.p.o. check CBC and start Pepcid IV while n.p.o.  Start low rate dextrose IV because of n.p.o. status.  #Goals of care discussion: I have discussed with the patient's grandson and other family member at bedside.  Patient with poor prognosis.  Palliative care requested.  Continue  antibiotics at least for another 24 hours to see if there is any significant improvement.  If no improvement patient will likely benefit from comfort care and possible hospice care.  DVT prophylaxis: Lovenox subcutaneous Code Status: DNR/DNI Family Communication: Patient's grandson at bedside Disposition Plan: Currently in a stepdown    Consultants:   Pulmonary critical care  Palliative care  Procedures: BiPAP Antimicrobials: Vanco and Zosyn  Subjective: Seen and examined at bedside.  Patient was on BiPAP on 100% oxygen.  Not responding well.  Objective: Vitals:   06/05/17 0916 06/05/17 1000 06/05/17 1200 06/05/17 1208  BP:  127/62 140/73   Pulse: (!) 116 (!) 115 (!) 117 (!) 117  Resp: (!) 24 (!) 23 (!) 25 (!) 23  Temp:    99.5 F (37.5 C)  TempSrc:    Axillary  SpO2: 100% 100% 100% 100%  Weight:      Height:        Intake/Output Summary (Last 24 hours) at 06/05/2017 1350 Last data filed at 06/05/2017 1200 Gross per 24 hour  Intake 2000 ml  Output 300 ml  Net 1700 ml   Filed Weights   06/04/17 1054  Weight: 75.8 kg (167 lb)    Examination:  General exam: Ill looking elderly male with BiPAP Respiratory system: Coarse breath sound bilateral, increased work of breathing.  Neck cardiovascular system: Regular tachycardic, S1-S2 normal.  No pedal edema.  Gastrointestinal system: Abdomen is nondistended, soft and nontender. Normal bowel sounds heard. Central nervous system: Following commands. Skin: No rashes, lesions or ulcers Psychiatry: Judgement and insight unable to assess    Data Reviewed: I have personally reviewed following labs and imaging studies  CBC: Recent Labs  Lab 06/04/17 0952 06/05/17 0528  WBC 26.8* 28.0*  NEUTROABS 25.5*  --   HGB 13.2 13.2  HCT 41.2 41.6  MCV 72.4* 72.3*  PLT 332 132   Basic Metabolic Panel: Recent Labs  Lab 06/04/17 0952 06/05/17 0057 06/05/17 0528  NA 137 136 137  K 4.0 4.2 4.2  CL 98* 104 103  CO2 24 19* 21*   GLUCOSE 176* 144* 141*  BUN 17 18 20   CREATININE 1.12 0.84 0.91  CALCIUM 9.2 8.9 8.9   GFR: Estimated Creatinine Clearance: 49.2 mL/min (by C-G formula based on SCr of 0.91 mg/dL). Liver Function Tests: Recent Labs  Lab 06/04/17 0952 06/05/17 0057 06/05/17 0528  AST 475* 225* 192*  ALT 382* 231* 218*  ALKPHOS 271* 204* 194*  BILITOT 3.9* 4.9* 5.0*  PROT 7.1 6.6 6.9  ALBUMIN 3.5 3.2* 3.3*   No results for input(s): LIPASE, AMYLASE in the last 168 hours. No results for input(s): AMMONIA in the last 168 hours. Coagulation Profile: Recent Labs  Lab 06/04/17 0952 06/05/17 0057  INR 1.18 1.41   Cardiac Enzymes: Recent Labs  Lab 06/04/17 0952  TROPONINI 0.03*   BNP (last 3 results) No results for input(s): PROBNP in the last 8760 hours. HbA1C: No results for input(s): HGBA1C in the last 72 hours. CBG: No results for input(s): GLUCAP in the last 168 hours. Lipid Profile: No results for input(s): CHOL, HDL, LDLCALC, TRIG, CHOLHDL, LDLDIRECT in the last 72 hours. Thyroid Function Tests: No results for input(s): TSH, T4TOTAL, FREET4, T3FREE, THYROIDAB in the last 72 hours. Anemia Panel: No results for input(s): VITAMINB12, FOLATE, FERRITIN, TIBC, IRON, RETICCTPCT in the last 72 hours. Sepsis Labs: Recent Labs  Lab 06/04/17 1015 06/04/17 1255 06/05/17 0057 06/05/17 0528  PROCALCITON  --   --  63.16  --   LATICACIDVEN 8.67* 7.94* 6.4* 5.6*    Recent Results (from the past 240 hour(s))  Culture, blood (Routine x 2)     Status: None (Preliminary result)   Collection Time: 06/04/17  9:52 AM  Result Value Ref Range Status   Specimen Description BLOOD RIGHT ANTECUBITAL  Final   Special Requests   Final    BOTTLES DRAWN AEROBIC AND ANAEROBIC Blood Culture adequate volume   Culture   Final    NO GROWTH 1 DAY Performed at [facility], [place] [address] [place], [place] [address]    Report Status PENDING  Incomplete  Culture, blood (Routine x 2)     Status: None  (Preliminary result)   Collection Time: 06/04/17  9:57 AM  Result Value Ref Range Status   Specimen Description LEFT ANTECUBITAL  Final   Special Requests   Final    BOTTLES DRAWN AEROBIC AND ANAEROBIC Blood Culture adequate volume   Culture   Final    NO GROWTH 1 DAY Performed at [facility], [place] [address], [place], [place] [address]    Report Status PENDING  Incomplete  MRSA PCR Screening     Status: None   Collection Time: 06/05/17  8:06 AM  Result Value Ref Range Status   MRSA by PCR NEGATIVE NEGATIVE Final    Comment:        The GeneXpert MRSA Assay (FDA approved for NASAL specimens only), is one component of a comprehensive MRSA colonization surveillance program. It is not intended to diagnose MRSA infection nor to guide or monitor treatment for MRSA infections.          Radiology Studies: Dg Chest 2 View  Result Date: 06/04/2017 CLINICAL DATA:  Shortness of breath, cough, congestion EXAM: CHEST  2 VIEW COMPARISON:  10/17/2016 FINDINGS: Patchy bilateral airspace disease has worsened since prior study concerning for pneumonia. Heart is normal size. No visible effusions or acute bony abnormality. Old left humeral neck fracture, stable. IMPRESSION: Worsening patchy bilateral airspace disease, right greater than left concerning for pneumonia. Electronically Signed   By: Rolm Baptise M.D.   On: 06/04/2017 10:35   Dg Chest Port 1 View  Result Date: 06/05/2017 CLINICAL DATA:  84 year old male with acute respiratory failure and hypoxia. Alzheimer's. Subsequent encounter. EXAM: PORTABLE CHEST 1 VIEW COMPARISON:  06/04/2017 chest x-ray. FINDINGS: Asymmetric airspace disease now greatest in the left perihilar region. No pneumothorax. Heart size within normal limits. Calcified aorta. No acute osseous abduct IMPRESSION: Asymmetric airspace disease now greatest in the left perihilar region. This may represent asymmetric pulmonary edema although infectious process also possible in  the proper clinical setting. Aortic Atherosclerosis (ICD10-I70.0). Electronically Signed   By: Genia Del M.D.   On: 06/05/2017 10:40   US Abdomen Limited Ruq  Result Date: 06/04/2017 CLINICAL DATA:  Elevated LFTs, fever EXAM: ULTRASOUND ABDOMEN LIMITED RIGHT UPPER QUADRANT COMPARISON:  CT 10/18/2012 FINDINGS: Gallbladder: Gallstones fill the gallbladder with extensive shadowing making it difficult to visualize the wall. Negative sonographic Murphy's. Common bile duct: Diameter: Normal caliber, 5 mm Liver: No focal lesion identified. Within normal limits in parenchymal echogenicity. Portal vein is patent on color Doppler imaging with normal direction of blood flow towards the liver. IMPRESSION: Gallbladder filled with gallstones.  No sonographic Murphy sign. Electronically Signed   By: Rolm Baptise M.D.   On: 06/04/2017 12:11        Scheduled Meds: . chlorhexidine  15 mL Mouth Rinse BID  . enoxaparin (LOVENOX) injection  40 mg Subcutaneous Q24H  . furosemide  40 mg Intravenous Q6H  . ipratropium-albuterol  3 mL Nebulization Q6H  . mouth rinse  15 mL Mouth Rinse q12n4p   Continuous Infusions: . sodium chloride 10 mL/hr at 06/05/17 1200  . piperacillin-tazobactam (ZOSYN)  IV Stopped (06/05/17 1311)  . sodium chloride    . vancomycin Stopped (06/05/17 0901)     LOS: 1 day     Tanna Furry, MD [facility] Pager [phone]  If 7PM-7AM, please contact night-coverage [email or web address] Password [email or web address] 06/05/2017, 1:50 PM

## 2017-06-05 NOTE — Care Management Note (Signed)
Case Management Note  Patient Details  Name: Douglas Mckee MRN: [id number] Date of Birth: 04-22-1923  Subjective/Objective:                  sepsis  Action/Plan: Date: June 05, 2017 Velva Harman, BSN, [place], [place] [phone] Chart and notes review for patient progress and needs. Will follow for case management and discharge needs.  Lives with adult children. Next review date: [id number]  Expected Discharge Date:  (unknown)               Expected Discharge Plan:  Home/Self Care  In-House Referral:     Discharge planning Services  CM Consult  Post Acute Care Choice:    Choice offered to:     DME Arranged:    DME Agency:     HH Arranged:    HH Agency:     Status of Service:  In process, will continue to follow  If discussed at Long Length of Stay Meetings, dates discussed:    Additional Comments:  Leeroy Cha, RN 06/05/2017, 8:06 AM

## 2017-06-05 NOTE — Progress Notes (Signed)
Pharmacy Antibiotic Note  Gokul Waybright is a 84 y.o. male admitted on 06/04/2017 with sepsis.  Pharmacy has been consulted for Vancomycin dosing.  Plan: Vancomycin 1gm iv q24hr Goal AUC = 400 - 500 for all indications, except meningitis (goal AUC > 500 and Cmin 15-20 mcg/mL)   Height: 5\' 5"  (165.1 cm) Weight: 167 lb (75.8 kg) IBW/kg (Calculated) : 61.5  Temp (24hrs), Avg:101.1 F (38.4 C), Min:100.2 F (37.9 C), Max:101.9 F (38.8 C)  Recent Labs  Lab 06/04/17 0952 06/04/17 1015 06/04/17 1255 06/05/17 0057  WBC 26.8*  --   --   --   CREATININE 1.12  --   --  0.84  LATICACIDVEN  --  8.67* 7.94* 6.4*    Estimated Creatinine Clearance: 53.3 mL/min (by C-G formula based on SCr of 0.84 mg/dL).    No Known Allergies  Antimicrobials this admission: 1/7 azithromycin x 1 1/7 ceftriaxone x 1 1/7 Flagyl x 1 1/7 Zosyn >>  1/8 vancomycin >>  Dose adjustments this admission: -  Microbiology results: pending  Thank you for allowing pharmacy to be a part of this patient's care.  Nani Skillern Crowford 06/05/2017 2:39 AM

## 2017-06-05 NOTE — Progress Notes (Signed)
[facility] PCCM  Available if needed  Roselie Awkward, MD [place] PCCM Pager: [phone] Cell: [phone] After 3pm or if no response, call [phone]

## 2017-06-05 NOTE — Progress Notes (Signed)
[place] Progress Note Patient Name: Douglas Mckee DOB: 29-Jul-1922 MRN: [id number]   Date of Service  06/05/2017  HPI/Events of Note  Hypertension - BP = 187/76 and HR = 129 Sinus Tachycardial   eICU Interventions  Will order:  1. Labetalol 10 mg IV Q 2 hours PRN SBP > 170.      Intervention Category Major Interventions: Hypertension - evaluation and management  Gentri Guardado [place] 06/05/2017, 6:44 AM

## 2017-06-05 NOTE — Progress Notes (Signed)
[facility] PCCM   S: asked to see patient again for worsening respiratory failure; started on BIPAP overnight, oxygenation has been worsening; grandson says that the patient was awake and alert earlier in the evening.  O:  Vitals:   06/05/17 0400 06/05/17 0745 06/05/17 0800 06/05/17 0916  BP:   136/75   Pulse:   (!) 111 (!) 116  Resp:   (!) 26 (!) 24  Temp: 98.8 F (37.1 C) 99.3 F (37.4 C)    TempSrc: Oral Axillary    SpO2:   (!) 82% 100%  Weight:      Height:        Intake/Output Summary (Last 24 hours) at 06/05/2017 0958 Last data filed at 06/05/2017 0850 Gross per 24 hour  Intake 1730 ml  Output 150 ml  Net 1580 ml     FiO2 (%):  [55 %-100 %] 100 % BIPAP 15/8 100%  General:  Resting comfortably in bed on BIPAP HENT: NCAT OP clear PULM: Rhonchi, crackles bilaterally B, some accessory muscle use CV: RRR, no mgr, JVD elevated GI: BS+, soft, nontender MSK: normal bulk and tone Neuro: sleepy, minimal responsiveness for me  CBC    Component Value Date/Time   WBC 28.0 (H) 06/05/2017 0528   RBC 5.75 06/05/2017 0528   HGB 13.2 06/05/2017 0528   HCT 41.6 06/05/2017 0528   PLT 265 06/05/2017 0528   MCV 72.3 (L) 06/05/2017 0528   MCH 23.0 (L) 06/05/2017 0528   MCHC 31.7 06/05/2017 0528   RDW 18.0 (H) 06/05/2017 0528   LYMPHSABS 0.8 06/04/2017 0952   MONOABS 0.5 06/04/2017 0952   EOSABS 0.0 06/04/2017 0952   BASOSABS 0.0 06/04/2017 0952   BMET    Component Value Date/Time   NA 137 06/05/2017 0528   NA 141 08/19/2015   K 4.2 06/05/2017 0528   CL 103 06/05/2017 0528   CO2 21 (L) 06/05/2017 0528   GLUCOSE 141 (H) 06/05/2017 0528   BUN 20 06/05/2017 0528   BUN 15 08/19/2015   CREATININE 0.91 06/05/2017 0528   CALCIUM 8.9 06/05/2017 0528   GFRNONAA >60 06/05/2017 0528   GFRAA >60 06/05/2017 0528   Lactic acid 5.6  Impression:  Severe sepsis Aspiration pneumonia Acute respiratory failure with hypoxemia Cholelithiasis, likely passed a stone on 1/7 Abnormal  LFTs Acute encephalopathy Dementia  Discussion/Plan: Douglas Mckee has not had a good evening.  Specifically his dyspnea and hypoxemia have worsened.  He is now requiring BIPAP.   This may represent worsening pneumonia or acute pulmonary edema.  Physical exam supports volume overload.  I met with the family extensively yesterday and they indicated that he would not want heroic measures.  I explained to his grandson this morning that he is worsening and we need to continue to focus on comfort.  I will write an order for lasix, KVO fluids, and get a CXR today as I believe these measures will help his comfort.  I explained to the grandson that palliative care would be seeing the patient to help transition to full comfort measures.  The grandson understood.  Would continue BIPAP as ordered for now, hopefully can transition off earlier.  PCCM will be available PRN  CODE STATUS: DNR  My cc time 35 minutes  Douglas Awkward, MD [place] PCCM Pager: [phone] Cell: [phone] After 3pm or if no response, call [phone]

## 2017-06-05 NOTE — Progress Notes (Signed)
Palliative:  Full note to follow. I met today at Douglas Mckee's bedside with his HCPOA/daughter, Douglas Mckee, and Douglas Mckee's son. We discussed poor prognosis. They express that they remain hopeful and will continue to pray. 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 They explain that if he is to die they are accepting of this but they also want to make sure "we give him the best chance" to improve. We discussed that if Douglas Mckee has not progressed to coming off BiPAP by tomorrow this is not a good sign. We did discuss more comfort measures as well to eliminate suffering especially if he is not improving on BiPAP.    , NP Palliative Medicine Team Pager # [phone] (M-F 8a-5p) Team Phone # [phone] (Nights/Weekends) 

## 2017-06-05 NOTE — Progress Notes (Signed)
Pt NT suctioned for small to moderate amount of thick tan secretions.  Pt tolerated well.

## 2017-06-05 NOTE — Progress Notes (Signed)
Pt NTS suctioned for a large amount of thick tan bloody secretions.  Pt tolerated well.  RT to monitor and assess as needed.

## 2017-06-05 NOTE — Progress Notes (Signed)
[place] Progress Note Patient Name: Douglas Mckee DOB: November 26, 1922 MRN: [id number]   Date of Service  06/05/2017  HPI/Events of Note  Multiple issues: 1. Request for BiPAP orders. Nurse states family is OK with BiPAP and 2. Request to change code status to DNR. I have not spoken with family. Dr. Bonnell Public note states that patient is DNR, however, no order for DNR was entered.   eICU Interventions  Will order: 1. BiPAP  2. Defer code status clarification to hospitalist service.  3. Will put patient back on PCCM list.      Intervention Category Major Interventions: Respiratory failure - evaluation and management  Sommer,Steven Eugene 06/05/2017, 3:37 AM

## 2017-06-05 NOTE — Consult Note (Signed)
Consultation Note Date: 06/05/2017   Patient Name: Douglas Mckee  DOB: [birth year]  MRN: [id number]  Age / Sex: 84 y.o., male  PCP: Verline Lema, MD Referring Physician: Rosita Fire, MD  Reason for Consultation: Establishing goals of care  HPI/Patient Profile: 84 y.o.malewithwith past medical history of dementia, CVA, dysphagia, h/o aspiration pneumonia, TIA, UTI, HTN admitted on 1/7/2019from home with worsening dyspnea over past few days and with reported URI and cough the past couple months. Dyspnea began after vomiting episode likely r/t passed gallstone ([place] abd shows gallstones filling the gallbladder) with likely aspiration pneumonia and sepsis.   Clinical Assessment and Goals of Care: I met today at Douglas Mckee's bedside with his HCPOA/daughter, Anderson Malta, and Celeste's son. We discussed poor prognosis. They express that they remain hopeful and will continue to pray. 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 They explain that if he is to die they are accepting of this but they also want to make sure "we give him the best chance" to improve. We discussed that if Douglas Mckee has not progressed to coming off BiPAP by tomorrow this is not a good sign. We did discuss more comfort measures as well to eliminate suffering especially if he is not improving on BiPAP. Family open to conversation but remain very hopeful and optimistic.   Primary Decision Maker HCPOA daughter Anderson Malta     SUMMARY OF RECOMMENDATIONS   - Family still very hopeful for improvement - Poor prognosis  Code Status/Advance Care Planning:  DNR   Symptom Management:  Family desires to continue current therapies and aggressive care outside DNR  Palliative Prophylaxis:   Aspiration, Bowel Regimen, Delirium Protocol, Frequent Pain Assessment, Oral Care and Turn Reposition  Additional Recommendations  (Limitations, Scope, Preferences):  Full Scope Treatment outside DNR  Psycho-social/Spiritual:   Desire for further Chaplaincy support:yes  Additional Recommendations: Caregiving  Support/Resources, Education on Hospice and Grief/Bereavement Support  Prognosis:   Hours - Days most likely  Discharge Planning: To Be Determined      Primary Diagnoses: Present on Admission: . Acute respiratory distress . Alzheimer's dementia . BPH (benign prostatic hyperplasia) . Metabolic encephalopathy . Sepsis due to pneumonia ([place]) . Demand ischemia ([place]) . Transaminitis   I have reviewed the medical record, interviewed the patient and family, and examined the patient. The following aspects are pertinent.  Past Medical History:  Diagnosis Date  . AKI (acute kidney injury) ([place])   . Alzheimers disease   . Anemia of chronic disease   . Aspiration pneumonia of right lower lobe ([place])   . BPH (benign prostatic hyperplasia)   . CVA (cerebral infarction)   . Dysphagia   . Gout   . Hypertensive urgency, malignant   . Influenza A   . Kidney infection   . Left humeral fracture   . Leukocytosis   . Metabolic encephalopathy   . Microcytic anemia   . Physical deconditioning   . Pneumonia   . PSA elevation   . Sepsis (  [place])   . Thrombocytosis ([place])   . TIA (transient ischemic attack)   . UTI (lower urinary tract infection)    Social History   Socioeconomic History  . Marital status: Married    Spouse name: None  . Number of children: None  . Years of education: None  . Highest education level: None  Social Needs  . Financial resource strain: None  . Food insecurity - worry: None  . Food insecurity - inability: None  . Transportation needs - medical: None  . Transportation needs - non-medical: None  Occupational History  . None  Tobacco Use  . Smoking status: Never Smoker  Substance and Sexual Activity  . Alcohol use: No  . Drug use: No  . Sexual activity: None  Other  Topics Concern  . None  Social History Narrative   Lives with his daughter and his wife.  Another daughter is involved in his care.  Walks without assist device.  No home services.  Former Company secretary.     Family History  Problem Relation Age of Onset  . High blood pressure Sister   . Diabetes Neg Hx    Scheduled Meds: . chlorhexidine  15 mL Mouth Rinse BID  . enoxaparin (LOVENOX) injection  40 mg Subcutaneous Q24H  . furosemide  40 mg Intravenous Q6H  . ipratropium-albuterol  3 mL Nebulization Q6H  . mouth rinse  15 mL Mouth Rinse q12n4p   Continuous Infusions: . dextrose 30 mL/hr at 06/05/17 1413  . famotidine (PEPCID) IV 20 mg (06/05/17 1449)  . piperacillin-tazobactam (ZOSYN)  IV Stopped (06/05/17 1311)  . sodium chloride    . vancomycin Stopped (06/05/17 0901)   PRN Meds:.ipratropium-albuterol, labetalol No Known Allergies Review of Systems  Unable to perform ROS: Acuity of condition    Physical Exam  Constitutional: He appears well-developed. He appears toxic. He has a sickly appearance.  Frail, elderly  Cardiovascular: Tachycardia present.  Pulmonary/Chest: No accessory muscle usage. No tachypnea. No respiratory distress. He has rhonchi. He has rales.  Lungs sound worse today than yesterday  Abdominal: Soft. Normal appearance. He exhibits no distension.  Neurological:  Opened eyes when turned but not following any commands, mainly unresponsive  Nursing note and vitals reviewed.   Vital Signs: BP (!) 128/54   Pulse (!) 117   Temp 99.5 F (37.5 C) (Axillary)   Resp (!) 22   Ht _0  (1.651 m)   Wt 75.8 kg (167 lb)   SpO2 100%   BMI 27.79 kg/m  Pain Assessment: PAINAD   Pain Score: 0-No pain   SpO2: SpO2: 100 % O2 Device:SpO2: 100 % O2 Flow Rate: .O2 Flow Rate (L/min): 14 L/min  IO: Intake/output summary:   Intake/Output Summary (Last 24 hours) at 06/05/2017 1604 Last data filed at 06/05/2017 1449 Gross per 24 hour  Intake 1950 ml  Output 600 ml  Net  1350 ml    LBM: Last BM Date: 06/05/17 Baseline Weight: Weight: 75.8 kg (167 lb) Most recent weight: Weight: 75.8 kg (167 lb)     Palliative Assessment/Data: 10%    Time Total: 75 min  Greater than 50%  of this time was spent counseling and coordinating care related to the above assessment and plan.  Signed by: Vinie Sill, NP Palliative Medicine Team Pager # [phone] (M-F 8a-5p) Team Phone # [phone] (Nights/Weekends)

## 2017-06-06 ENCOUNTER — Encounter (HOSPITAL_COMMUNITY): Payer: Self-pay

## 2017-06-06 DIAGNOSIS — Z515 Encounter for palliative care: Secondary | ICD-10-CM

## 2017-06-06 DIAGNOSIS — Z7189 Other specified counseling: Secondary | ICD-10-CM

## 2017-06-06 LAB — RESPIRATORY PANEL BY PCR
ADENOVIRUS-RVPPCR: NOT DETECTED
BORDETELLA PERTUSSIS-RVPCR: NOT DETECTED
CHLAMYDOPHILA PNEUMONIAE-RVPPCR: NOT DETECTED
CORONAVIRUS 229E-RVPPCR: NOT DETECTED
CORONAVIRUS HKU1-RVPPCR: NOT DETECTED
CORONAVIRUS NL63-RVPPCR: NOT DETECTED
Coronavirus OC43: NOT DETECTED
Influenza A: NOT DETECTED
Influenza B: NOT DETECTED
MYCOPLASMA PNEUMONIAE-RVPPCR: NOT DETECTED
Metapneumovirus: NOT DETECTED
PARAINFLUENZA VIRUS 3-RVPPCR: NOT DETECTED
Parainfluenza Virus 1: NOT DETECTED
Parainfluenza Virus 2: NOT DETECTED
Parainfluenza Virus 4: NOT DETECTED
RHINOVIRUS / ENTEROVIRUS - RVPPCR: NOT DETECTED
Respiratory Syncytial Virus: NOT DETECTED

## 2017-06-06 LAB — RENAL FUNCTION PANEL
Albumin: 2.6 g/dL — ABNORMAL LOW (ref 3.5–5.0)
Anion gap: 7 (ref 5–15)
BUN: 30 mg/dL — AB (ref 6–20)
CHLORIDE: 102 mmol/L (ref 101–111)
CO2: 30 mmol/L (ref 22–32)
Calcium: 8.6 mg/dL — ABNORMAL LOW (ref 8.9–10.3)
Creatinine, Ser: 1.41 mg/dL — ABNORMAL HIGH (ref 0.61–1.24)
GFR calc Af Amer: 48 mL/min — ABNORMAL LOW (ref >60)
GFR calc non Af Amer: 42 mL/min — ABNORMAL LOW (ref >60)
GLUCOSE: 158 mg/dL — AB (ref 65–99)
POTASSIUM: 4.1 mmol/L (ref 3.5–5.1)
Phosphorus: 3.4 mg/dL (ref 2.5–4.6)
Sodium: 139 mmol/L (ref 135–145)

## 2017-06-06 LAB — GLUCOSE, CAPILLARY
GLUCOSE-CAPILLARY: 119 mg/dL — AB (ref 65–99)
GLUCOSE-CAPILLARY: 22 mg/dL — AB (ref 65–99)
GLUCOSE-CAPILLARY: 90 mg/dL (ref 65–99)
GLUCOSE-CAPILLARY: 28 mg/dL — AB (ref 65–99)
Glucose-Capillary: 178 mg/dL — ABNORMAL HIGH (ref 65–99)
Glucose-Capillary: 112 mg/dL — ABNORMAL HIGH (ref 65–99)

## 2017-06-06 LAB — CBC
HEMATOCRIT: 39.2 % (ref 39.0–52.0)
Hemoglobin: 12.8 g/dL — ABNORMAL LOW (ref 13.0–17.0)
MCH: 23.6 pg — ABNORMAL LOW (ref 26.0–34.0)
MCHC: 32.7 g/dL (ref 30.0–36.0)
MCV: 72.2 fL — ABNORMAL LOW (ref 78.0–100.0)
Platelets: 234 10*3/uL (ref 150–400)
RBC: 5.43 MIL/uL (ref 4.22–5.81)
RDW: 17.9 % — AB (ref 11.5–15.5)
WBC: 21.8 10*3/uL — ABNORMAL HIGH (ref 4.0–10.5)

## 2017-06-06 LAB — VANCOMYCIN, RANDOM: VANCOMYCIN RM: 7

## 2017-06-06 MED ORDER — DEXTROSE 50 % IV SOLN
INTRAVENOUS | Status: AC
Start: 2017-06-06 — End: 2017-06-06
  Administered 2017-06-06: 50 mL
  Filled 2017-06-06: qty 50

## 2017-06-06 MED ORDER — DEXTROSE 50 % IV SOLN
INTRAVENOUS | Status: AC
  Administered 2017-06-06: 16:00:00
  Filled 2017-06-06: qty 50

## 2017-06-06 NOTE — Progress Notes (Signed)
[facility] PROGRESS NOTE  Douglas Mckee UUV:[id number] DOB: 1923-03-18 DOA: 06/04/2017 PCP: Verline Lema, MD  Brief summary   84 year old male with history significant for BPH, hypertension, gout, previous TIA, dementia presented to the ER with several days of progressively worsening dyspnea.  In the ER patient was febrile to 101.9, tachycardia, respiratory distress.  Elevated lactic acid level of 8.6, transaminitis, leukocytosis and elevated troponin level.  Chest x-ray with bilateral airspace disease.  Started on antibiotics.  Critical care was consulted and admitted in a stepdown unit.   Assessment/Plan:  Acute respiratory failure with hypoxia/pneumonia/sepsis. -Patient remains critically ill, unable to wean from BiPAP. Tachycardic, tachypneic, on iv antibiotics, lactic acidosis. As per patient's family member including grandson no aggressive measures and no intubation.  Discussed with the pulmonary critical care team.  -No clinical improvement at this time. Palliative care consult appreciated. Family is still hoping for recovery. However, he is declining, likely needs comfort care.   Severe sepsis due to pneumonia/aspiration pneumonia: Currently on broad-spectrum antibiotics.  Follow-up culture results.  Transaminitis with cholelithiasis: Repeat labs trending down.  Already on broad-spectrum antibiotics.  Not a candidate for  surgical intervention.  Demand ischemia in the setting of sepsis.  Acute metabolic encephalopathy in patient with history of dementia likely age related: Patient was on BiPAP and not responding well.  Family reported that he was not sleeping last night and mostly very tired.  Continue supportive care.  On n.p.o. check CBC and start Pepcid IV while n.p.o.  Start low rate dextrose IV because of n.p.o. status.  Goals of care discussion: I have discussed with the patient's grandson and other family member at bedside.  Patient with poor prognosis. Family is  still hoping for recovery. But patient, remains critically ill, declining... will likely benefit from comfort care and possible hospice care.   Code Status: DNR Family Communication: d/w patient, his family, RN (indicate person spoken with, relationship, and if by phone, the number) Disposition Plan: pend improvement    Consultants:  CCCM  Palliative   Procedures:  none  Antibiotics: Anti-infectives (From admission, onward)   Start     Dose/Rate Route Frequency Ordered Stop   06/05/17 1100  azithromycin (ZITHROMAX) 500 mg in dextrose 5 % 250 mL IVPB  Status:  Discontinued     500 mg 250 mL/hr over 60 Minutes Intravenous Every 24 hours 06/04/17 1105 06/04/17 1106   06/05/17 1100  cefTRIAXone (ROCEPHIN) 1 g in dextrose 5 % 50 mL IVPB  Status:  Discontinued     1 g 100 mL/hr over 30 Minutes Intravenous Every 24 hours 06/04/17 1105 06/04/17 1429   06/05/17 0245  vancomycin (VANCOCIN) IVPB 1000 mg/200 mL premix  Status:  Discontinued     1,000 mg 200 mL/hr over 60 Minutes Intravenous Daily at bedtime 06/05/17 0239 06/06/17 0456   06/04/17 1600  piperacillin-tazobactam (ZOSYN) IVPB 3.375 g     3.375 g 12.5 mL/hr over 240 Minutes Intravenous Every 8 hours 06/04/17 1436     06/04/17 1115  metroNIDAZOLE (FLAGYL) IVPB 500 mg     500 mg 100 mL/hr over 60 Minutes Intravenous  Once 06/04/17 1106 06/04/17 1458   06/04/17 1115  vancomycin (VANCOCIN) IVPB 1000 mg/200 mL premix  Status:  Discontinued     1,000 mg 200 mL/hr over 60 Minutes Intravenous  Once 06/04/17 1112 06/04/17 1429   06/04/17 1100  cefTRIAXone (ROCEPHIN) 1 g in dextrose 5 % 50 mL IVPB     1 g 100 mL/hr  over 30 Minutes Intravenous  Once 06/04/17 1046 06/04/17 1132   06/04/17 1100  azithromycin (ZITHROMAX) 500 mg in dextrose 5 % 250 mL IVPB     500 mg 250 mL/hr over 60 Minutes Intravenous  Once 06/04/17 1046 06/04/17 1224   06/04/17 1015  piperacillin-tazobactam (ZOSYN) IVPB 3.375 g  Status:  Discontinued     3.375  g 100 mL/hr over 30 Minutes Intravenous  Once 06/04/17 1005 06/04/17 1045   06/04/17 1015  vancomycin (VANCOCIN) IVPB 1000 mg/200 mL premix  Status:  Discontinued     1,000 mg 200 mL/hr over 60 Minutes Intravenous  Once 06/04/17 1005 06/04/17 1046        (indicate start date, and stop date if known)  HPI/Subjective: On bipap. Does not follow commands. Rapidly desaturated, tachypnea and tachycardia when removed bipap. Remains critically ill   Objective: Vitals:   06/06/17 0750 06/06/17 0800  BP:    Pulse: (!) 139 (!) 120  Resp: 18 (!) 22  Temp:    SpO2: 90% 100%    Intake/Output Summary (Last 24 hours) at 06/06/2017 0812 Last data filed at 06/06/2017 0800 Gross per 24 hour  Intake 890 ml  Output 2650 ml  Net -1760 ml   Filed Weights   06/04/17 1054  Weight: 75.8 kg (167 lb)    Exam:   General:  Mild distress   Cardiovascular: s1,s2 tachycardia   Respiratory: BL rales   Abdomen: soft, nd  Musculoskeletal: mild edema    Data Reviewed: Basic Metabolic Panel: Recent Labs  Lab 06/04/17 0952 06/05/17 0057 06/05/17 0528 06/06/17 0330  NA 137 136 137 139  K 4.0 4.2 4.2 4.1  CL 98* 104 103 102  CO2 24 19* 21* 30  GLUCOSE 176* 144* 141* 158*  BUN 17 18 20  30*  CREATININE 1.12 0.84 0.91 1.41*  CALCIUM 9.2 8.9 8.9 8.6*  PHOS  --   --   --  3.4   Liver Function Tests: Recent Labs  Lab 06/04/17 0952 06/05/17 0057 06/05/17 0528 06/06/17 0330  AST 475* 225* 192*  --   ALT 382* 231* 218*  --   ALKPHOS 271* 204* 194*  --   BILITOT 3.9* 4.9* 5.0*  --   PROT 7.1 6.6 6.9  --   ALBUMIN 3.5 3.2* 3.3* 2.6*   No results for input(s): LIPASE, AMYLASE in the last 168 hours. No results for input(s): AMMONIA in the last 168 hours. CBC: Recent Labs  Lab 06/04/17 0952 06/05/17 0528 06/06/17 0330  WBC 26.8* 28.0* 21.8*  NEUTROABS 25.5*  --   --   HGB 13.2 13.2 12.8*  HCT 41.2 41.6 39.2  MCV 72.4* 72.3* 72.2*  PLT 332 265 234   Cardiac Enzymes: Recent Labs   Lab 06/04/17 0952  TROPONINI 0.03*   BNP (last 3 results) Recent Labs    06/04/17 0952  BNP 111.6*    ProBNP (last 3 results) No results for input(s): PROBNP in the last 8760 hours.  CBG: Recent Labs  Lab 06/05/17 1607 06/05/17 2101 06/05/17 2103 06/05/17 2106  GLUCAP 80 40* 72 72    Recent Results (from the past 240 hour(s))  Culture, blood (Routine x 2)     Status: None (Preliminary result)   Collection Time: 06/04/17  9:52 AM  Result Value Ref Range Status   Specimen Description BLOOD RIGHT ANTECUBITAL  Final   Special Requests   Final    BOTTLES DRAWN AEROBIC AND ANAEROBIC Blood Culture adequate volume  Culture   Final    NO GROWTH 1 DAY Performed at [facility], [place] [address], [place], [place] [address]    Report Status PENDING  Incomplete  Culture, blood (Routine x 2)     Status: None (Preliminary result)   Collection Time: 06/04/17  9:57 AM  Result Value Ref Range Status   Specimen Description LEFT ANTECUBITAL  Final   Special Requests   Final    BOTTLES DRAWN AEROBIC AND ANAEROBIC Blood Culture adequate volume   Culture   Final    NO GROWTH 1 DAY Performed at [facility], [place] [address] [place], [place] [address]    Report Status PENDING  Incomplete  Urine culture     Status: None   Collection Time: 06/04/17 10:05 AM  Result Value Ref Range Status   Specimen Description URINE, RANDOM  Final   Special Requests NONE  Final   Culture   Final    NO GROWTH Performed at [facility], [place] [address], [place], [place] [address]    Report Status 06/05/2017 FINAL  Final  MRSA PCR Screening     Status: None   Collection Time: 06/05/17  8:06 AM  Result Value Ref Range Status   MRSA by PCR NEGATIVE NEGATIVE Final    Comment:        The GeneXpert MRSA Assay (FDA approved for NASAL specimens only), is one component of a comprehensive MRSA colonization surveillance program. It is not intended to diagnose MRSA infection nor to guide  or monitor treatment for MRSA infections.      Studies: Dg Chest 2 View  Result Date: 06/04/2017 CLINICAL DATA:  Shortness of breath, cough, congestion EXAM: CHEST  2 VIEW COMPARISON:  10/17/2016 FINDINGS: Patchy bilateral airspace disease has worsened since prior study concerning for pneumonia. Heart is normal size. No visible effusions or acute bony abnormality. Old left humeral neck fracture, stable. IMPRESSION: Worsening patchy bilateral airspace disease, right greater than left concerning for pneumonia. Electronically Signed   By: Rolm Baptise M.D.   On: 06/04/2017 10:35   Dg Chest Port 1 View  Result Date: 06/05/2017 CLINICAL DATA:  84 year old male with acute respiratory failure and hypoxia. Alzheimer's. Subsequent encounter. EXAM: PORTABLE CHEST 1 VIEW COMPARISON:  06/04/2017 chest x-ray. FINDINGS: Asymmetric airspace disease now greatest in the left perihilar region. No pneumothorax. Heart size within normal limits. Calcified aorta. No acute osseous abduct IMPRESSION: Asymmetric airspace disease now greatest in the left perihilar region. This may represent asymmetric pulmonary edema although infectious process also possible in the proper clinical setting. Aortic Atherosclerosis (ICD10-I70.0). Electronically Signed   By: Genia Del M.D.   On: 06/05/2017 10:40   US Abdomen Limited Ruq  Result Date: 06/04/2017 CLINICAL DATA:  Elevated LFTs, fever EXAM: ULTRASOUND ABDOMEN LIMITED RIGHT UPPER QUADRANT COMPARISON:  CT 10/18/2012 FINDINGS: Gallbladder: Gallstones fill the gallbladder with extensive shadowing making it difficult to visualize the wall. Negative sonographic Murphy's. Common bile duct: Diameter: Normal caliber, 5 mm Liver: No focal lesion identified. Within normal limits in parenchymal echogenicity. Portal vein is patent on color Doppler imaging with normal direction of blood flow towards the liver. IMPRESSION: Gallbladder filled with gallstones.  No sonographic Murphy sign.  Electronically Signed   By: Rolm Baptise M.D.   On: 06/04/2017 12:11    Scheduled Meds: . chlorhexidine  15 mL Mouth Rinse BID  . enoxaparin (LOVENOX) injection  40 mg Subcutaneous Q24H  . levalbuterol  0.63 mg Nebulization Q6H  .  mouth rinse  15 mL Mouth Rinse q12n4p   Continuous Infusions: . dextrose 30 mL/hr at 06/06/17 0800  . famotidine (PEPCID) IV Stopped (06/05/17 2326)  . piperacillin-tazobactam (ZOSYN)  IV 3.375 g (06/06/17 0745)  . sodium chloride      Principal Problem:   Acute respiratory distress Active Problems:   Metabolic encephalopathy   BPH (benign prostatic hyperplasia)   Alzheimer's dementia   Sepsis due to pneumonia [facility])   Demand ischemia ([place])   Transaminitis   Acute respiratory failure with hypoxemia (HCC)   Pneumonia of both lungs due to infectious organism    Time spent: >35 minutes     Kinnie Feil  [facility] Pager [phone]. If 7PM-7AM, please contact night-coverage at [email or web address], password [facility] 06/06/2017, 8:12 AM  LOS: 2 days

## 2017-06-06 NOTE — Progress Notes (Signed)
Daily Progress Note   Patient Name: Douglas Mckee       Date: 06/06/2017 DOB: Jun 16, 1922  Age: 84 y.o. MRN#: [id number] Attending Physician: Kinnie Feil, MD Primary Care Physician: Verline Lema, MD Admit Date: 06/04/2017  Reason for Consultation/Follow-up: Establishing goals of care  Subjective: Douglas Mckee appears worse to me today. Family at bedside.   Length of Stay: 2  Current Medications: Scheduled Meds:  . chlorhexidine  15 mL Mouth Rinse BID  . dextrose      . enoxaparin (LOVENOX) injection  40 mg Subcutaneous Q24H  . levalbuterol  0.63 mg Nebulization Q6H  . mouth rinse  15 mL Mouth Rinse q12n4p    Continuous Infusions: . dextrose 75 mL/hr at 06/06/17 1150  . famotidine (PEPCID) IV Stopped (06/05/17 2326)  . piperacillin-tazobactam (ZOSYN)  IV Stopped (06/06/17 1145)  . sodium chloride      PRN Meds: labetalol  Physical Exam  Constitutional: He appears well-developed. He appears toxic. He has a sickly appearance.  Frail, elderly  Cardiovascular: Tachycardia present.  Pulmonary/Chest: No accessory muscle usage. No tachypnea. No respiratory distress. He has rhonchi. He has rales.  Lungs sound worse today than yesterday  Abdominal: Soft. Normal appearance. He exhibits no distension.  Neurological:  Opened eyes when turned but not following any commands, mainly unresponsive  Nursing note and vitals reviewed.           Vital Signs: BP (!) 170/70   Pulse (!) 139   Temp 99.2 F (37.3 C) (Axillary)   Resp (!) 22   Ht 5\' 5"  (1.651 m)   Wt 75.8 kg (167 lb)   SpO2 100%   BMI 27.79 kg/m  SpO2: SpO2: 100 % O2 Device: O2 Device: Bi-PAP O2 Flow Rate: O2 Flow Rate (L/min): 2 L/min  Intake/output summary:   Intake/Output Summary (Last 24 hours) at 06/06/2017  1553 Last data filed at 06/06/2017 0800 Gross per 24 hour  Intake 620 ml  Output 2200 ml  Net -1580 ml   LBM: Last BM Date: 06/05/17 Baseline Weight: Weight: 75.8 kg (167 lb) Most recent weight: Weight: 75.8 kg (167 lb)       Palliative Assessment/Data:  10%    Flowsheet Rows     Most Recent Value  Intake Tab  Referral Department  Hospitalist  Unit  at Time of Referral  ICU  Palliative Care Primary Diagnosis  Pulmonary  Date Notified  06/05/17  Palliative Care Type  New Palliative care  Reason for referral  Clarify Goals of Care, Counsel Regarding Hospice  Date of Admission  06/04/17  Date first seen by Palliative Care  06/05/17  # of days Palliative referral response time  0 Day(s)  # of days IP prior to Palliative referral  1  Clinical Assessment  Psychosocial & Spiritual Assessment  Palliative Care Outcomes      Patient Active Problem List   Diagnosis Date Noted  . Acute respiratory failure with hypoxemia ([place])   . Pneumonia of both lungs due to infectious organism   . Acute respiratory distress 06/04/2017  . Sepsis due to pneumonia ([place]) 06/04/2017  . Demand ischemia ([place]) 06/04/2017  . Transaminitis 06/04/2017  . Metabolic encephalopathy 18/29/9371  . BPH (benign prostatic hyperplasia) 10/18/2012  . Alzheimer's dementia 10/18/2012    Palliative Care Assessment & Plan   HPI: 84 y.o. male  with past medical history of dementia, CVA, dysphagia, h/o aspiration pneumonia, TIA, UTI, HTN admitted on 06/04/2017 from home with worsening dyspnea over past few days and with reported URI and cough the past couple months. Dyspnea began after vomiting episode likely r/t passed gallstone ([place] abd shows gallstones filling the gallbladder) with likely aspiration pneumonia and sepsis.   Assessment: I explain to family my assessment that Douglas Mckee's lungs sound worse to me. Expressed my concern that his blood sugar is dropping and we are having to give him more fluid and that this  can collect in his lungs and make his breathing worse. Grandson keep referring to Douglas Mckee as "sleeping." Fixated on trying to titrate support on BiPAP - explained this is not possible currently with tachycardia and listening to his lungs. Discussed also their concerns with RT so they can reinforce.   Douglas Mckee (daughter/HCPOA) tells me "we are still praying" and "please don't give up on him." I reassure her that we are doing everything that we can. Tried to reinforce how critically ill he is and that he is worsening. Family remains optimistic and desiring current aggressive measures. Unable to discuss options for more comfort measures at this time given their expressed fear of "giving up."   Recommendations/Plan:  Family desires to continue current therapies which are fairly aggressive (and unfortunately he is not responding to these therapies).  Not prepared for move to comfort care. Fairly resistant to comfort care.   Code Status:  DNR  Prognosis:   Hours - Days  Discharge Planning:  Anticipated Hospital Death   Thank you for allowing the Palliative Medicine Team to assist in the care of this patient.   Total Time 35 min Prolonged Time Billed  no       Greater than 50%  of this time was spent counseling and coordinating care related to the above assessment and plan.  Vinie Sill, NP Palliative Medicine Team Pager # [phone] (M-F 8a-5p) Team Phone # [phone] (Nights/Weekends)

## 2017-06-06 NOTE — Progress Notes (Signed)
MD requested that pt be assessed without BiPAP.  BiPAP removed, pt's respiratory status did not worsen. Significant gurgling was heard, but pt was not in distress.  Approximately 7-10 minutes later pt's HR increased to the 200's.  Pt immediately placed back on BiPAP. HR went back to its baseline (120's-130's).  MD made aware.  Orders given to keep the patient on BiPAP until further notice, and PRN order for Metoprolol written.    Will continue to monitor.

## 2017-06-06 NOTE — Progress Notes (Signed)
Pharmacy Antibiotic Note  Douglas Mckee is a 84 y.o. male admitted on 06/04/2017 with sepsis.  Pharmacy has been consulted for Vancomycin dosing.  Source appears to be pneumonia  Today, 06/06/2017  Day #3 antibiotics, Day #2 vanco/zosyn  Renal: AKI - SCr increase this am. UOP OK  WBC better  Random vancomycin level = 7 following v  Plan:  AKI noted. Random level checked this am d/t rise in SCr.  Level = 7.  Evaluate need to continue vancomycin with current culture data (will d/w TRH) --> OK to stop  Zosyn 3.375gm IV q8h over 4h infusion remains appropriate  Daily SCr for AKI and above regimen increases risk of nephrotoxicity  Height: 5\' 5"  (165.1 cm) Weight: 167 lb (75.8 kg) IBW/kg (Calculated) : 61.5  Temp (24hrs), Avg:98.8 F (37.1 C), Min:98 F (36.7 C), Max:99.5 F (37.5 C)  Recent Labs  Lab 06/04/17 0952 06/04/17 1015 06/04/17 1255 06/05/17 0057 06/05/17 0528 06/06/17 0330 06/06/17 0645  WBC 26.8*  --   --   --  28.0* 21.8*  --   CREATININE 1.12  --   --  0.84 0.91 1.41*  --   LATICACIDVEN  --  8.67* 7.94* 6.4* 5.6*  --   --   VANCORANDOM  --   --   --   --   --   --  7    Estimated Creatinine Clearance: 31.8 mL/min (A) (by C-G formula based on SCr of 1.41 mg/dL (H)).    No Known Allergies  Antimicrobials this admission: 1/7 azithromycin x 1 1/7 ceftriaxone x 1 1/7 Flagyl x 1 1/7 Zosyn >>  1/8 vancomycin >>   Dose adjustments this admission: 1/9 0645 RVL = 7 mcg/mL (only received vancomycin 1gm 1/8 at 0729)  Microbiology results: 1/7 BCx: NGTD 1/7 UCx:NG 1/8 BCx:  1/8 sputum:  1/8 MRSA PCR: neg 1/8 Strep Ag: neg 1/8 resp panel:   Thank you for allowing pharmacy to be a part of this patient's care.  Doreene Eland, PharmD, BCPS.   Pager: [phone] 06/06/2017 10:04 AM

## 2017-06-06 NOTE — Progress Notes (Signed)
PHARMACY BRIEF NOTE: VANCOMYCIN DOSING  Received electronic [employer] alert that SCr has increased by more than 50% (0.91 to 1.41) in past 24 hours.  Current receiving vancomycin 1000 mg IV q24h, next dose due 0800.  Will hold 0800 dose (removed from profile and MAR) until patient can be re-evaluated by ICU pharmacist and CCM team.  Also requested random vancomycin level from blood already in lab this AM..  Clayburn Pert, PharmD, BCPS Pager: [phone] 06/06/2017  5:02 AM

## 2017-06-07 DIAGNOSIS — R0602 Shortness of breath: Secondary | ICD-10-CM

## 2017-06-07 LAB — BASIC METABOLIC PANEL
ANION GAP: 7 (ref 5–15)
BUN: 30 mg/dL — ABNORMAL HIGH (ref 6–20)
CO2: 30 mmol/L (ref 22–32)
Calcium: 8.6 mg/dL — ABNORMAL LOW (ref 8.9–10.3)
Chloride: 102 mmol/L (ref 101–111)
Creatinine, Ser: 1.09 mg/dL (ref 0.61–1.24)
GFR calc non Af Amer: 57 mL/min — ABNORMAL LOW (ref >60)
Glucose, Bld: 154 mg/dL — ABNORMAL HIGH (ref 65–99)
POTASSIUM: 3.8 mmol/L (ref 3.5–5.1)
SODIUM: 139 mmol/L (ref 135–145)

## 2017-06-07 LAB — GLUCOSE, CAPILLARY
GLUCOSE-CAPILLARY: 40 mg/dL — AB (ref 65–99)
GLUCOSE-CAPILLARY: 139 mg/dL — AB (ref 65–99)
GLUCOSE-CAPILLARY: 140 mg/dL — AB (ref 65–99)
GLUCOSE-CAPILLARY: 144 mg/dL — AB (ref 65–99)
Glucose-Capillary: 154 mg/dL — ABNORMAL HIGH (ref 65–99)

## 2017-06-07 LAB — CBC
HCT: 36.7 % — ABNORMAL LOW (ref 39.0–52.0)
HEMOGLOBIN: 11.9 g/dL — AB (ref 13.0–17.0)
MCH: 23.3 pg — ABNORMAL LOW (ref 26.0–34.0)
MCHC: 32.4 g/dL (ref 30.0–36.0)
MCV: 72 fL — ABNORMAL LOW (ref 78.0–100.0)
PLATELETS: 237 10*3/uL (ref 150–400)
RBC: 5.1 MIL/uL (ref 4.22–5.81)
RDW: 17.4 % — ABNORMAL HIGH (ref 11.5–15.5)
WBC: 24.9 10*3/uL — AB (ref 4.0–10.5)

## 2017-06-07 LAB — LEGIONELLA PNEUMOPHILA SEROGP 1 UR AG: L. pneumophila Serogp 1 Ur Ag: NEGATIVE

## 2017-06-07 MED ORDER — METHYLPREDNISOLONE SODIUM SUCC 125 MG IJ SOLR
125.0000 mg | Freq: Once | INTRAMUSCULAR | Status: AC
  Administered 2017-06-07: 125 mg via INTRAVENOUS
  Filled 2017-06-07: qty 2

## 2017-06-07 MED ORDER — LIP MEDEX EX OINT
TOPICAL_OINTMENT | CUTANEOUS | Status: AC
  Administered 2017-06-07: 15:00:00
  Filled 2017-06-07: qty 7

## 2017-06-07 MED ORDER — SODIUM CHLORIDE 0.9 % IV SOLN
INTRAVENOUS | Status: DC
  Administered 2017-06-07 – 2017-06-10 (×6): via INTRAVENOUS

## 2017-06-07 MED ORDER — POLYVINYL ALCOHOL 1.4 % OP SOLN
1.0000 [drp] | OPHTHALMIC | Status: DC | PRN
  Filled 2017-06-07: qty 15

## 2017-06-07 MED ORDER — MORPHINE SULFATE (PF) 4 MG/ML IV SOLN
2.0000 mg | INTRAVENOUS | Status: DC | PRN
  Administered 2017-06-07 – 2017-06-12 (×19): 2 mg via INTRAVENOUS
  Filled 2017-06-07 (×21): qty 1

## 2017-06-07 MED ORDER — METHYLPREDNISOLONE SODIUM SUCC 125 MG IJ SOLR
60.0000 mg | Freq: Four times a day (QID) | INTRAMUSCULAR | Status: DC
  Administered 2017-06-07 – 2017-06-11 (×15): 60 mg via INTRAVENOUS
  Filled 2017-06-07 (×15): qty 2

## 2017-06-07 NOTE — Progress Notes (Signed)
[facility] PROGRESS NOTE  Douglas Mckee VXB:[id number] DOB: November 21, 1922 DOA: 06/04/2017 PCP: Verline Lema, MD  Brief Summary: 84 year old male with history significant for BPH, hypertension, gout, previous TIA, dementia presented to the ER with several days of progressively worsening dyspnea. In the ER patient was febrile to 101.9, tachycardia, respiratory distress. Elevated lactic acid level of 8.6, transaminitis, leukocytosis and elevated troponin level. Chest x-ray with bilateral airspace disease. Started on antibiotics. Critical care was consulted and admitted in a stepdown unit.  Assessment/Plan: Principal Problem:   Sepsis due to pneumonia [facility]) Active Problems:   Metabolic encephalopathy   BPH (benign prostatic hyperplasia)   Alzheimer's dementia   Demand ischemia (HCC)   Transaminitis   Acute respiratory failure with hypoxemia (HCC)   Pneumonia of both lungs due to infectious organism   Goals of care, counseling/discussion   Palliative care encounter   1. Sepsis  Patient on Zosyn 2. Acute resp failure with hypoxemia  Patient remains critically ill, unable to wean from BiPAP.  Slightly tachycardic and tachypneic.  Lactic acidosis.    Family desires to continue aggressive medical care, however no aggressive measures such as intubation.  Patient continues to be DNR.  Care team involved.  Appreciate their input  Will trial steroids to decrease inflammation 3. PNA bilaterally  Continue Zosyn  Respiratory virus panel negative  Blood cultures still pending, however no growth. 4. Transaminitis with Cholelithiasis  On Zosyn 5. Metabolic encephalopathy  Minimal improvement 6. Alzheimer's dementia  Code Status: DNR Family Communication: Discussed with daughter and grandson at bedside Disposition Plan: Pending   Consultants:  Palliative care  CCM  Procedures:  None  Antibiotics:  Zosyn  HPI/Subjective: Patient continues to be sedated.  Prior  to my arrival arrival, the patient had been significant respiratory distress with retractions.  Patient given morphine, which eased patient's work of breathing.  Patient appears comfortable  Objective: Vitals:   06/07/17 0800 06/07/17 0853  BP: (!) 161/62   Pulse: 90 98  Resp: (!) 24 (!) 24  Temp: 99.5 F (37.5 C)   SpO2: 90% 100%    Intake/Output Summary (Last 24 hours) at 06/07/2017 1035 Last data filed at 06/07/2017 0800 Gross per 24 hour  Intake 1352.5 ml  Output 475 ml  Net 877.5 ml   Filed Weights   06/04/17 1054  Weight: 75.8 kg (167 lb)    Exam:   General: Somnolent, unable to arouse, minimal work of breathing  Cardiovascular: Regular rate normal S1-S2 sounds.  No murmurs, rubs, gallops.  Respiratory: Bilateral rales  Abdomen: Soft, nondistended  Musculoskeletal: Minimal edema.  Warm extremities.  2+ peripheral pulses  Data Reviewed: Basic Metabolic Panel: Recent Labs  Lab 06/04/17 0952 06/05/17 0057 06/05/17 0528 06/06/17 0330 06/07/17 0327  NA 137 136 137 139 139  K 4.0 4.2 4.2 4.1 3.8  CL 98* 104 103 102 102  CO2 24 19* 21* 30 30  GLUCOSE 176* 144* 141* 158* 154*  BUN 17 18 20  30* 30*  CREATININE 1.12 0.84 0.91 1.41* 1.09  CALCIUM 9.2 8.9 8.9 8.6* 8.6*  PHOS  --   --   --  3.4  --    Liver Function Tests: Recent Labs  Lab 06/04/17 0952 06/05/17 0057 06/05/17 0528 06/06/17 0330  AST 475* 225* 192*  --   ALT 382* 231* 218*  --   ALKPHOS 271* 204* 194*  --   BILITOT 3.9* 4.9* 5.0*  --   PROT 7.1 6.6 6.9  --  ALBUMIN 3.5 3.2* 3.3* 2.6*   No results for input(s): LIPASE, AMYLASE in the last 168 hours. No results for input(s): AMMONIA in the last 168 hours. CBC: Recent Labs  Lab 06/04/17 0952 06/05/17 0528 06/06/17 0330 06/07/17 0327  WBC 26.8* 28.0* 21.8* 24.9*  NEUTROABS 25.5*  --   --   --   HGB 13.2 13.2 12.8* 11.9*  HCT 41.2 41.6 39.2 36.7*  MCV 72.4* 72.3* 72.2* 72.0*  PLT 332 265 234 237   Cardiac Enzymes: Recent Labs    Lab 06/04/17 0952  TROPONINI 0.03*   BNP (last 3 results) Recent Labs    06/04/17 0952  BNP 111.6*    ProBNP (last 3 results) No results for input(s): PROBNP in the last 8760 hours.  CBG: Recent Labs  Lab 06/06/17 0836 06/06/17 1137 06/06/17 1539 06/06/17 2105 06/07/17 0743  GLUCAP 178* 90 28* 112* 139*    Recent Results (from the past 240 hour(s))  Culture, blood (Routine x 2)     Status: None (Preliminary result)   Collection Time: 06/04/17  9:52 AM  Result Value Ref Range Status   Specimen Description BLOOD RIGHT ANTECUBITAL  Final   Special Requests   Final    BOTTLES DRAWN AEROBIC AND ANAEROBIC Blood Culture adequate volume   Culture   Final    NO GROWTH 2 DAYS Performed at [facility], [place] [address] [place], [place] [address]    Report Status PENDING  Incomplete  Culture, blood (Routine x 2)     Status: None (Preliminary result)   Collection Time: 06/04/17  9:57 AM  Result Value Ref Range Status   Specimen Description BLOOD LEFT ANTECUBITAL  Final   Special Requests   Final    BOTTLES DRAWN AEROBIC AND ANAEROBIC Blood Culture adequate volume   Culture   Final    NO GROWTH 2 DAYS Performed at [facility], [place] [address] [place], [place] [address]    Report Status PENDING  Incomplete  Urine culture     Status: None   Collection Time: 06/04/17 10:05 AM  Result Value Ref Range Status   Specimen Description URINE, RANDOM  Final   Special Requests NONE  Final   Culture   Final    NO GROWTH Performed at [facility], [place] [address], [place], [place] [address]    Report Status 06/05/2017 FINAL  Final  Culture, blood (x 2)     Status: None (Preliminary result)   Collection Time: 06/05/17  2:02 AM  Result Value Ref Range Status   Specimen Description BLOOD RIGHT HAND  Final   Special Requests IN PEDIATRIC BOTTLE Blood Culture adequate volume  Final   Culture   Final    NO GROWTH 1 DAY Performed at [facility], [place]  [address] [place], [place] [address]    Report Status PENDING  Incomplete  Culture, blood (x 2)     Status: None (Preliminary result)   Collection Time: 06/05/17  5:28 AM  Result Value Ref Range Status   Specimen Description BLOOD LEFT HAND  Final   Special Requests IN PEDIATRIC BOTTLE Blood Culture adequate volume  Final   Culture   Final    NO GROWTH 1 DAY Performed at [facility], [place] [address], [place], [place] [address]    Report Status PENDING  Incomplete  MRSA PCR Screening     Status: None   Collection Time: 06/05/17  8:06 AM  Result Value Ref Range Status   MRSA by PCR NEGATIVE NEGATIVE Final    Comment:        The GeneXpert MRSA Assay (FDA approved for NASAL specimens only), is one component of a comprehensive MRSA colonization surveillance program. It is not intended to diagnose MRSA infection nor to guide or monitor treatment for MRSA infections.   Respiratory Panel by PCR     Status: None   Collection Time: 06/05/17  4:15 PM  Result Value Ref Range Status   Adenovirus NOT DETECTED NOT DETECTED Final   Coronavirus 229E NOT DETECTED NOT DETECTED Final   Coronavirus HKU1 NOT DETECTED NOT DETECTED Final   Coronavirus NL63 NOT DETECTED NOT DETECTED Final   Coronavirus OC43 NOT DETECTED NOT DETECTED Final   Metapneumovirus NOT DETECTED NOT DETECTED Final   Rhinovirus / Enterovirus NOT DETECTED NOT DETECTED Final   Influenza A NOT DETECTED NOT DETECTED Final   Influenza B NOT DETECTED NOT DETECTED Final   Parainfluenza Virus 1 NOT DETECTED NOT DETECTED Final   Parainfluenza Virus 2 NOT DETECTED NOT DETECTED Final   Parainfluenza Virus 3 NOT DETECTED NOT DETECTED Final   Parainfluenza Virus 4 NOT DETECTED NOT DETECTED Final   Respiratory Syncytial Virus NOT DETECTED NOT DETECTED Final   Bordetella pertussis NOT DETECTED NOT DETECTED Final   Chlamydophila pneumoniae NOT DETECTED NOT DETECTED Final   Mycoplasma pneumoniae NOT DETECTED NOT DETECTED Final      Studies: No results found.  Scheduled Meds: . chlorhexidine  15 mL Mouth Rinse BID  . enoxaparin (LOVENOX) injection  40 mg Subcutaneous Q24H  . levalbuterol  0.63 mg Nebulization Q6H  . mouth rinse  15 mL Mouth Rinse q12n4p   Continuous Infusions: . dextrose 75 mL/hr at 06/07/17 0600  . famotidine (PEPCID) IV Stopped (06/06/17 2134)  . piperacillin-tazobactam (ZOSYN)  IV 3.375 g (06/07/17 0806)  . sodium chloride        Time spent: 30 minutes    Truett Mainland  [facility] Pager: [phone] 06/07/2017, 10:35 AM  LOS: 3 days

## 2017-06-07 NOTE — Progress Notes (Signed)
During pt bath old healing pressure ulcer on mid to lower back noted. Covered with pink foam. Soft nodule noted on Right upper back under shoulder blade.

## 2017-06-07 NOTE — Progress Notes (Addendum)
Daily Progress Note   Patient Name: Douglas Mckee       Date: 06/07/2017 DOB: Nov 19, 1922  Age: 84 y.o. MRN#: [id number] Attending Physician: Truett Mainland, DO Primary Care Physician: Verline Lema, MD Admit Date: 06/04/2017  Reason for Consultation/Follow-up: Establishing goals of care  Subjective:  Mr. Cammack continues to worsen. No improvement.   Length of Stay: 3  Current Medications: Scheduled Meds:  . chlorhexidine  15 mL Mouth Rinse BID  . enoxaparin (LOVENOX) injection  40 mg Subcutaneous Q24H  . levalbuterol  0.63 mg Nebulization Q6H  . mouth rinse  15 mL Mouth Rinse q12n4p    Continuous Infusions: . dextrose 75 mL/hr at 06/07/17 0600  . famotidine (PEPCID) IV Stopped (06/06/17 2134)  . piperacillin-tazobactam (ZOSYN)  IV 3.375 g (06/07/17 0806)  . sodium chloride      PRN Meds: labetalol, morphine injection  Physical Exam  Constitutional: He appears well-developed. He appears toxic. He has a sickly appearance.  Frail, elderly  Cardiovascular: Tachycardia present.  Pulmonary/Chest: Accessory muscle usage present. Tachypnea noted. He is in respiratory distress. He has decreased breath sounds in the right lower field. He has rhonchi. He has rales.  Lungs sound worse today than yesterday  Abdominal: Soft. Normal appearance. He exhibits no distension.  Neurological:  Opened eyes when turned but not following any commands, mainly unresponsive  Nursing note and vitals reviewed.           Vital Signs: BP (!) 161/62   Pulse 98   Temp 99.5 F (37.5 C) (Axillary)   Resp (!) 24   Ht 5\' 5"  (1.651 m)   Wt 75.8 kg (167 lb)   SpO2 100%   BMI 27.79 kg/m  SpO2: SpO2: 100 % O2 Device: O2 Device: Bi-PAP O2 Flow Rate: O2 Flow Rate (L/min): 2 L/min  Intake/output summary:    Intake/Output Summary (Last 24 hours) at 06/07/2017 0955 Last data filed at 06/07/2017 0800 Gross per 24 hour  Intake 1352.5 ml  Output 475 ml  Net 877.5 ml   LBM: Last BM Date: 06/05/17 Baseline Weight: Weight: 75.8 kg (167 lb) Most recent weight: Weight: 75.8 kg (167 lb)       Palliative Assessment/Data:  10%    Flowsheet Rows     Most Recent Value  Intake Tab  Referral  Department  Hospitalist  Unit at Time of Referral  ICU  Palliative Care Primary Diagnosis  Pulmonary  Date Notified  06/05/17  Palliative Care Type  New Palliative care  Reason for referral  Clarify Goals of Care, Counsel Regarding Hospice  Date of Admission  06/04/17  Date first seen by Palliative Care  06/05/17  # of days Palliative referral response time  0 Day(s)  # of days IP prior to Palliative referral  1  Clinical Assessment  Psychosocial & Spiritual Assessment  Palliative Care Outcomes      Patient Active Problem List   Diagnosis Date Noted  . Goals of care, counseling/discussion   . Palliative care encounter   . Acute respiratory failure with hypoxemia ([place])   . Pneumonia of both lungs due to infectious organism   . Acute respiratory distress 06/04/2017  . Sepsis due to pneumonia ([place]) 06/04/2017  . Demand ischemia ([place]) 06/04/2017  . Transaminitis 06/04/2017  . Metabolic encephalopathy 96/22/2979  . BPH (benign prostatic hyperplasia) 10/18/2012  . Alzheimer's dementia 10/18/2012    Palliative Care Assessment & Plan   HPI: 84 y.o. male  with past medical history of dementia, CVA, dysphagia, h/o aspiration pneumonia, TIA, UTI, HTN admitted on 06/04/2017 from home with worsening dyspnea over past few days and with reported URI and cough the past couple months. Dyspnea began after vomiting episode likely r/t passed gallstone ([place] abd shows gallstones filling the gallbladder) with likely aspiration pneumonia and sepsis.   Assessment: Long discussion today with grandson at bedside. I  discussed my goal now is to make sure Mr. Ulrich is comfortable and not suffering. We reviewed how he his body has not been able to fight off the infection even with all the medical support we are giving to help him do so; in fact he is worsening. Discussed labored breathing and increased WBC. 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 I explained that we need to reconsider the BiPAP as this is only causing suffering at this point and is not doing what we had hoped. Yolanda Bonine is very tearful and says that family will need to talk.   A few minutes later daughter/HCPOA, Celeste, comes in. We were joined by attending who discussed poor prognosis and plans to add steroids. Anderson Malta continues to request for all interventions to be tried to try and make him better.   Unfortunately I fear that they have false hope with all the interventions we are providing. I have tried to be clear that these are not working because his body is not able to fight the infection and he is unfortunately not improving. However, they do all agree to provide medications for comfort to ensure he is not in pain and is not suffering. He had very labored breathing when I entered but resolved with morphine.   Recommendations/Plan:  Family desires to continue current therapies which are fairly aggressive (and unfortunately he is not responding to these therapies).  Not prepared for move to comfort care. Fairly resistant to full comfort care.   SOB/pain: Morphine 2 mg IV every hour prn.   Code Status:  DNR  Prognosis:   Hours - Days  Discharge Planning:  Anticipated Hospital Death   Thank you for allowing the Palliative Medicine Team to assist in the care of this patient.   Total Time 0930-1105 95 min Prolonged Time Billed  yes      Greater than 50%  of this time was spent counseling and coordinating care related  to the above assessment and plan.  Vinie Sill, NP Palliative Medicine Team Pager # [phone] (M-F 8a-5p) Team Phone # [phone] (Nights/Weekends)

## 2017-06-07 NOTE — Progress Notes (Addendum)
Pt last 3 CBGs 0800- 139 1200- 140 1600- 154 Paged [place], DO regarding increasing values. Orders given to change IV fluid to NS. Orders carried out. Will continue to monitor.

## 2017-06-08 DIAGNOSIS — L8990 Pressure ulcer of unspecified site, unspecified stage: Secondary | ICD-10-CM

## 2017-06-08 LAB — GLUCOSE, CAPILLARY
Glucose-Capillary: 102 mg/dL — ABNORMAL HIGH (ref 65–99)
Glucose-Capillary: 127 mg/dL — ABNORMAL HIGH (ref 65–99)
Glucose-Capillary: 144 mg/dL — ABNORMAL HIGH (ref 65–99)

## 2017-06-08 LAB — COMPREHENSIVE METABOLIC PANEL
ALK PHOS: 106 U/L (ref 38–126)
ALT: 69 U/L — AB (ref 17–63)
AST: 34 U/L (ref 15–41)
Albumin: 2.7 g/dL — ABNORMAL LOW (ref 3.5–5.0)
Anion gap: 9 (ref 5–15)
BILIRUBIN TOTAL: 1.3 mg/dL — AB (ref 0.3–1.2)
BUN: 25 mg/dL — ABNORMAL HIGH (ref 6–20)
CALCIUM: 8.9 mg/dL (ref 8.9–10.3)
CO2: 30 mmol/L (ref 22–32)
CREATININE: 0.97 mg/dL (ref 0.61–1.24)
Chloride: 100 mmol/L — ABNORMAL LOW (ref 101–111)
Glucose, Bld: 156 mg/dL — ABNORMAL HIGH (ref 65–99)
Potassium: 4.1 mmol/L (ref 3.5–5.1)
Sodium: 139 mmol/L (ref 135–145)
Total Protein: 6.2 g/dL — ABNORMAL LOW (ref 6.5–8.1)

## 2017-06-08 LAB — CBC
HEMATOCRIT: 38.6 % — AB (ref 39.0–52.0)
HEMOGLOBIN: 12.1 g/dL — AB (ref 13.0–17.0)
MCH: 22.7 pg — AB (ref 26.0–34.0)
MCHC: 31.3 g/dL (ref 30.0–36.0)
MCV: 72.6 fL — ABNORMAL LOW (ref 78.0–100.0)
Platelets: 240 10*3/uL (ref 150–400)
RBC: 5.32 MIL/uL (ref 4.22–5.81)
RDW: 17.4 % — ABNORMAL HIGH (ref 11.5–15.5)
WBC: 20 10*3/uL — ABNORMAL HIGH (ref 4.0–10.5)

## 2017-06-08 NOTE — Progress Notes (Signed)
Date: June 08, 2017 Velva Harman, BSN, [place], [place] Chart and notes review for patient progress and needs. Will follow for case management and discharge needs. Next review date: [id number]

## 2017-06-08 NOTE — Progress Notes (Signed)
PMT progress note  Patient seen, met with wife and son at bedside.  He is off BIPAP, he is currently on O2 [place], he did require pain medication administration a little while ago. Continues to appear somnolent, family states that they believe he has been some what more responsive towards them earlier today. At present, patient is resting in bed, with eyes closed, he does not appear to be wincing or grimacing.   BP (!) 132/57   Pulse 77   Temp 98.6 F (37 C) (Axillary)   Resp (!) 23   Ht '5\' 5"'  (1.651 m)   Wt 75.8 kg (167 lb)   SpO2 100%   BMI 27.79 kg/m  Labs and imaging noted, chart reviewed.   Elderly gentleman,somnolent but appears comfortable at the moment.  Regular Rales bilaterally  Trace edema Does not open eyes, does not follow commands  Brief life review performed: patient was living with his wife at home, prior to this current hospitalization, patient as per family, was independent with all his ADLs, family thinks he may have passed a gall stone, then became nauseous and vomited, that led to this hospitalization. They state that this decline has been rather sudden and acute, they remain hopeful for his stabilization/recovery. Offered active listening and supportive care.   Sepsis PNA, possibly aspiration Acute hypoxic resp failure Transaminitis with cholelithiasis.  Chart review notes history of Alzheimer's.   Continue current mode of care for now.  PMT to peripherally follow hospital course, disease trajectory to help guide further decision making and disposition planning.  No additional palliative specific recommendations at this time.   25 minutes spent Loistine Chance MD [facility] health palliative medicine team [phone]

## 2017-06-08 NOTE — Plan of Care (Signed)
  Pain Managment: General experience of comfort will improve 06/08/2017 1702 - Progressing by Dorene Sorrow, RN   Safety: Ability to remain free from injury will improve 06/08/2017 1702 - Progressing by Dorene Sorrow, RN   Skin Integrity: Risk for impaired skin integrity will decrease 06/08/2017 1702 - Progressing by Dorene Sorrow, RN

## 2017-06-08 NOTE — Progress Notes (Addendum)
[facility] PROGRESS NOTE  Douglas Mckee [id number]:[id number] DOB: 12-07-1922 DOA: 06/04/2017 PCP: Verline Lema, MD  Brief Summary: 84 year old male with history significant for BPH, hypertension, gout, previous TIA, dementia presented to the ER with several days of progressively worsening dyspnea and acutely worse after episodes of vomiting. In the ER patient was febrile to 101.9, tachycardia, respiratory distress. Elevated lactic acid level of 8.6, transaminitis, leukocytosis and elevated troponin level. Chest x-ray with bilateral airspace disease. Started on antibiotics. Critical care was consulted and admitted in a stepdown unit.  Assessment/Plan: Principal Problem:   Sepsis due to pneumonia [facility]) Active Problems:   Metabolic encephalopathy   BPH (benign prostatic hyperplasia)   Alzheimer's dementia   Demand ischemia (HCC)   Transaminitis   Acute respiratory failure with hypoxemia (HCC)   Pneumonia of both lungs due to infectious organism   Goals of care, counseling/discussion   Palliative care encounter   SOB (shortness of breath)   Pressure injury of skin   1. Sepsis  Patient on Zosyn 2. Acute resp failure with hypoxemia  Patient remains critically ill, unable to wean from BiPAP.  Tachycardia improved. Still tachypnic, especially when more agitated.    Will attempt to wean off BiPAP today     Family desires to continue aggressive medical care, however no aggressive measures such as intubation.  Patient continues to be DNR.  Palliative Care team involved.  Appreciate their input  Continue steroids to decrease inflammation 3. PNA bilaterally  ? Aspiration  Continue Zosyn  Respiratory virus panel negative  Blood cultures still pending, however no growth.  Continue Solu-Medrol  CBC tomorrow 4. Transaminitis with Cholelithiasis  On Zosyn 5. Metabolic encephalopathy  Slight improvement - resisting arm movement, some attempt at removing BiPap mask.   6. Alzheimer's dementia 7. Elevated transaminases  Improved   Code Status: DNR Family Communication: Discussed with daughter and grandson at bedside Disposition Plan: Pending   Consultants:  Palliative care  CCM  Procedures:  None  Antibiotics:  Zosyn  HPI/Subjective: Patient continues to be somnolent.  Appears to be breathing comfortably on BiPAP.  Some spontaneous movements observed.  Daughter states that patient open his eyes had some response to verbal cues overnight.  Per nursing, patient remains afebrile.  Objective: Vitals:   06/08/17 0725 06/08/17 0819  BP:    Pulse:  84  Resp:  (!) 25  Temp: 98 F (36.7 C)   SpO2:  100%    Intake/Output Summary (Last 24 hours) at 06/08/2017 0843 Last data filed at 06/08/2017 0641 Gross per 24 hour  Intake 1975 ml  Output 240 ml  Net 1735 ml   Filed Weights   06/04/17 1054  Weight: 75.8 kg (167 lb)    Exam:   General: Somnolent.  No response to verbal stimuli, but responds to physical stimuli with withdrawal and resisted movement  Cardiovascular: Regular rate with normal S1 and S2 sounds.  No murmurs, rubs, gallops  Respiratory: Rales bilaterally, particularly worse in the bases.  Abdomen: Soft, nondistended.  Musculoskeletal: Minimal edema.  Warm extremities.  2+ peripheral pulses.  SCDs in place  Data Reviewed: Basic Metabolic Panel: Recent Labs  Lab 06/05/17 0057 06/05/17 0528 06/06/17 0330 06/07/17 0327 06/08/17 0334  NA 136 137 139 139 139  K 4.2 4.2 4.1 3.8 4.1  CL 104 103 102 102 100*  CO2 19* 21* 30 30 30   GLUCOSE 144* 141* 158* 154* 156*  BUN 18 20 30* 30* 25*  CREATININE 0.84 0.91 1.41* 1.09  0.97  CALCIUM 8.9 8.9 8.6* 8.6* 8.9  PHOS  --   --  3.4  --   --    Liver Function Tests: Recent Labs  Lab 06/04/17 0952 06/05/17 0057 06/05/17 0528 06/06/17 0330 06/08/17 0334  AST 475* 225* 192*  --  34  ALT 382* 231* 218*  --  69*  ALKPHOS 271* 204* 194*  --  106  BILITOT 3.9* 4.9*  5.0*  --  1.3*  PROT 7.1 6.6 6.9  --  6.2*  ALBUMIN 3.5 3.2* 3.3* 2.6* 2.7*   No results for input(s): LIPASE, AMYLASE in the last 168 hours. No results for input(s): AMMONIA in the last 168 hours. CBC: Recent Labs  Lab 06/04/17 0952 06/05/17 0528 06/06/17 0330 06/07/17 0327 06/08/17 0334  WBC 26.8* 28.0* 21.8* 24.9* 20.0*  NEUTROABS 25.5*  --   --   --   --   HGB 13.2 13.2 12.8* 11.9* 12.1*  HCT 41.2 41.6 39.2 36.7* 38.6*  MCV 72.4* 72.3* 72.2* 72.0* 72.6*  PLT 332 265 234 237 240   Cardiac Enzymes: Recent Labs  Lab 06/04/17 0952  TROPONINI 0.03*   BNP (last 3 results) Recent Labs    06/04/17 0952  BNP 111.6*    ProBNP (last 3 results) No results for input(s): PROBNP in the last 8760 hours.  CBG: Recent Labs  Lab 06/07/17 0743 06/07/17 1215 06/07/17 1624 06/07/17 2113 06/08/17 0755  GLUCAP 139* 140* 154* 144* 102*    Recent Results (from the past 240 hour(s))  Culture, blood (Routine x 2)     Status: None (Preliminary result)   Collection Time: 06/04/17  9:52 AM  Result Value Ref Range Status   Specimen Description BLOOD RIGHT ANTECUBITAL  Final   Special Requests   Final    BOTTLES DRAWN AEROBIC AND ANAEROBIC Blood Culture adequate volume   Culture   Final    NO GROWTH 3 DAYS Performed at [facility], [place] [address] [place], [place] [address]    Report Status PENDING  Incomplete  Culture, blood (Routine x 2)     Status: None (Preliminary result)   Collection Time: 06/04/17  9:57 AM  Result Value Ref Range Status   Specimen Description BLOOD LEFT ANTECUBITAL  Final   Special Requests   Final    BOTTLES DRAWN AEROBIC AND ANAEROBIC Blood Culture adequate volume   Culture   Final    NO GROWTH 3 DAYS Performed at [facility], [place] [address], [place], [place] [address]    Report Status PENDING  Incomplete  Urine culture     Status: None   Collection Time: 06/04/17 10:05 AM  Result Value Ref Range Status   Specimen Description URINE,  RANDOM  Final   Special Requests NONE  Final   Culture   Final    NO GROWTH Performed at [facility], [place] [address] [place], [place] [address]    Report Status 06/05/2017 FINAL  Final  Culture, blood (x 2)     Status: None (Preliminary result)   Collection Time: 06/05/17  2:02 AM  Result Value Ref Range Status   Specimen Description BLOOD RIGHT HAND  Final   Special Requests IN PEDIATRIC BOTTLE Blood Culture adequate volume  Final   Culture   Final    NO GROWTH 2 DAYS Performed at [facility], [place] [address], [place], [place] [address]    Report Status PENDING  Incomplete  Culture, blood (x  2)     Status: None (Preliminary result)   Collection Time: 06/05/17  5:28 AM  Result Value Ref Range Status   Specimen Description BLOOD LEFT HAND  Final   Special Requests IN PEDIATRIC BOTTLE Blood Culture adequate volume  Final   Culture   Final    NO GROWTH 2 DAYS Performed at [facility], [place] [address], [place], [place] [address]    Report Status PENDING  Incomplete  MRSA PCR Screening     Status: None   Collection Time: 06/05/17  8:06 AM  Result Value Ref Range Status   MRSA by PCR NEGATIVE NEGATIVE Final    Comment:        The GeneXpert MRSA Assay (FDA approved for NASAL specimens only), is one component of a comprehensive MRSA colonization surveillance program. It is not intended to diagnose MRSA infection nor to guide or monitor treatment for MRSA infections.   Respiratory Panel by PCR     Status: None   Collection Time: 06/05/17  4:15 PM  Result Value Ref Range Status   Adenovirus NOT DETECTED NOT DETECTED Final   Coronavirus 229E NOT DETECTED NOT DETECTED Final   Coronavirus HKU1 NOT DETECTED NOT DETECTED Final   Coronavirus NL63 NOT DETECTED NOT DETECTED Final   Coronavirus OC43 NOT DETECTED NOT DETECTED Final   Metapneumovirus NOT DETECTED NOT DETECTED Final   Rhinovirus / Enterovirus NOT DETECTED NOT DETECTED Final   Influenza A NOT DETECTED  NOT DETECTED Final   Influenza B NOT DETECTED NOT DETECTED Final   Parainfluenza Virus 1 NOT DETECTED NOT DETECTED Final   Parainfluenza Virus 2 NOT DETECTED NOT DETECTED Final   Parainfluenza Virus 3 NOT DETECTED NOT DETECTED Final   Parainfluenza Virus 4 NOT DETECTED NOT DETECTED Final   Respiratory Syncytial Virus NOT DETECTED NOT DETECTED Final   Bordetella pertussis NOT DETECTED NOT DETECTED Final   Chlamydophila pneumoniae NOT DETECTED NOT DETECTED Final   Mycoplasma pneumoniae NOT DETECTED NOT DETECTED Final     Studies: No results found.  Scheduled Meds: . chlorhexidine  15 mL Mouth Rinse BID  . enoxaparin (LOVENOX) injection  40 mg Subcutaneous Q24H  . levalbuterol  0.63 mg Nebulization Q6H  . mouth rinse  15 mL Mouth Rinse q12n4p  . methylPREDNISolone (SOLU-MEDROL) injection  60 mg Intravenous Q6H   Continuous Infusions: . sodium chloride 100 mL/hr at 06/08/17 0600  . famotidine (PEPCID) IV Stopped (06/07/17 2154)  . piperacillin-tazobactam (ZOSYN)  IV 3.375 g (06/08/17 0823)  . sodium chloride        Time spent: 30 minutes    Truett Mainland  [facility] Pager: [phone] 06/08/2017, 8:43 AM  LOS: 4 days

## 2017-06-09 DIAGNOSIS — I10 Essential (primary) hypertension: Secondary | ICD-10-CM | POA: Diagnosis not present

## 2017-06-09 LAB — BASIC METABOLIC PANEL
ANION GAP: 10 (ref 5–15)
BUN: 28 mg/dL — ABNORMAL HIGH (ref 6–20)
CHLORIDE: 107 mmol/L (ref 101–111)
CO2: 26 mmol/L (ref 22–32)
Calcium: 8.7 mg/dL — ABNORMAL LOW (ref 8.9–10.3)
Creatinine, Ser: 0.8 mg/dL (ref 0.61–1.24)
GFR calc Af Amer: >60 mL/min (ref >60)
GFR calc non Af Amer: >60 mL/min (ref >60)
GLUCOSE: 161 mg/dL — AB (ref 65–99)
POTASSIUM: 3.8 mmol/L (ref 3.5–5.1)
Sodium: 143 mmol/L (ref 135–145)

## 2017-06-09 LAB — CULTURE, BLOOD (ROUTINE X 2)
CULTURE: NO GROWTH
Culture: NO GROWTH
SPECIAL REQUESTS: ADEQUATE
Special Requests: ADEQUATE

## 2017-06-09 LAB — GLUCOSE, CAPILLARY
GLUCOSE-CAPILLARY: 150 mg/dL — AB (ref 65–99)
GLUCOSE-CAPILLARY: 178 mg/dL — AB (ref 65–99)
Glucose-Capillary: 134 mg/dL — ABNORMAL HIGH (ref 65–99)
Glucose-Capillary: 116 mg/dL — ABNORMAL HIGH (ref 65–99)
Glucose-Capillary: 109 mg/dL — ABNORMAL HIGH (ref 65–99)

## 2017-06-09 LAB — CBC
HEMATOCRIT: 38.6 % — AB (ref 39.0–52.0)
HEMOGLOBIN: 13.1 g/dL (ref 13.0–17.0)
MCH: 23.8 pg — ABNORMAL LOW (ref 26.0–34.0)
MCHC: 33.9 g/dL (ref 30.0–36.0)
MCV: 70.1 fL — AB (ref 78.0–100.0)
Platelets: 215 10*3/uL (ref 150–400)
RBC: 5.51 MIL/uL (ref 4.22–5.81)
RDW: 17 % — AB (ref 11.5–15.5)
WBC: 17.8 10*3/uL — AB (ref 4.0–10.5)

## 2017-06-09 MED ORDER — INSULIN GLARGINE 100 UNIT/ML ~~LOC~~ SOLN
10.0000 [IU] | Freq: Every day | SUBCUTANEOUS | Status: DC
  Administered 2017-06-09 – 2017-06-19 (×11): 10 [IU] via SUBCUTANEOUS
  Filled 2017-06-09 (×14): qty 0.1

## 2017-06-09 MED ORDER — HYDRALAZINE HCL 20 MG/ML IJ SOLN
10.0000 mg | Freq: Once | INTRAMUSCULAR | Status: AC
  Administered 2017-06-09: 10 mg via INTRAVENOUS
  Filled 2017-06-09: qty 1

## 2017-06-09 MED ORDER — METOPROLOL TARTRATE 5 MG/5ML IV SOLN
10.0000 mg | Freq: Four times a day (QID) | INTRAVENOUS | Status: DC
  Administered 2017-06-09 – 2017-06-11 (×8): 10 mg via INTRAVENOUS
  Filled 2017-06-09 (×7): qty 10

## 2017-06-09 MED ORDER — METOPROLOL TARTRATE 5 MG/5ML IV SOLN
5.0000 mg | Freq: Four times a day (QID) | INTRAVENOUS | Status: DC
  Administered 2017-06-09: 5 mg via INTRAVENOUS
  Filled 2017-06-09: qty 5

## 2017-06-09 MED ORDER — METOPROLOL TARTRATE 5 MG/5ML IV SOLN
10.0000 mg | Freq: Four times a day (QID) | INTRAVENOUS | Status: DC
  Filled 2017-06-09: qty 10

## 2017-06-09 MED ORDER — SCOPOLAMINE 1 MG/3DAYS TD PT72
1.0000 | MEDICATED_PATCH | TRANSDERMAL | Status: DC
  Administered 2017-06-09 – 2017-06-24 (×6): 1.5 mg via TRANSDERMAL
  Filled 2017-06-09 (×6): qty 1

## 2017-06-09 NOTE — Plan of Care (Signed)
  Pain Managment: General experience of comfort will improve 06/09/2017 2020 - Progressing by Mickie Kay, RN

## 2017-06-09 NOTE — Progress Notes (Addendum)
[facility] PROGRESS NOTE  Douglas Mckee [id number]:[id number] DOB: 11-20-1922 DOA: 06/04/2017 PCP: Verline Lema, MD  Brief Summary: 84 year old male with history significant for BPH, hypertension, gout, previous TIA, dementia presented to the ER with several days of progressively worsening dyspnea and acutely worse after episodes of vomiting. In the ER patient was febrile to 101.9, tachycardia, respiratory distress. Elevated lactic acid level of 8.6, transaminitis, leukocytosis and elevated troponin level. Chest x-ray with bilateral airspace disease. Started on antibiotics. Critical care was consulted and admitted in a stepdown unit.  Assessment/Plan: Principal Problem:   Sepsis due to pneumonia [facility]) Active Problems:   Metabolic encephalopathy   BPH (benign prostatic hyperplasia)   Alzheimer's dementia   Demand ischemia (HCC)   Transaminitis   Acute respiratory failure with hypoxemia (HCC)   Pneumonia of both lungs due to infectious organism   Goals of care, counseling/discussion   Palliative care encounter   SOB (shortness of breath)   Pressure injury of skin   Essential hypertension   1. Sepsis  Patient on Zosyn 2. Acute resp failure with hypoxemia  Patient remains critically ill, but with slight improvement.   Tachycardia improved. Weaned off BiPAP yesterday.   Family desires to continue aggressive medical care, however no aggressive measures such as intubation.  Patient continues to be DNR.  Palliative Care team involved.  Appreciate their input  Continue steroids to decrease inflammation 3. PNA bilaterally  ? Aspiration -will need swallow eval if/when he recovers  Continue Zosyn  Respiratory virus panel negative  Blood cultures still pending, however no growth.  Continue Solu-Medrol  CBC daily - WBC improved to 25>20>17.8  Does appear to have increased secretions that I am concerned he is not handling very well.  Will place scopolamine patch to help  with secretions. 4. Transaminitis with Cholelithiasis  On Zosyn 5. Metabolic encephalopathy  Appears to have some improvements at time -has some spontaneous, but nonpurposeful movements.  Family states that the patient continues to have periods where he opens his eyes.  Alzheimer's dementia 7. Elevated transaminases  Improved 8.  Hypertension  Start metoprolol IV 5 mg every 6hrs 9. Hyperglycemia  Start lantus 10units daily  Code Status: DNR Family Communication: Discussed with daughter  at bedside Disposition Plan: Pending   Consultants:  Palliative care  CCM  Procedures:  None  Antibiotics:  Zosyn  HPI/Subjective: Patient somnolent this morning.  Appears to be breathing comfortably on nasal cannula and maintaining oxygen saturations.    Objective: Vitals:   06/09/17 0728 06/09/17 0800  BP:  (!) 187/82  Pulse:  83  Resp:  (!) 23  Temp: 98.1 F (36.7 C)   SpO2:  98%    Intake/Output Summary (Last 24 hours) at 06/09/2017 0924 Last data filed at 06/09/2017 0900 Gross per 24 hour  Intake 2650 ml  Output 800 ml  Net 1850 ml   Filed Weights   06/04/17 1054  Weight: 75.8 kg (167 lb)    Exam:   General: Somnolent.  No response to verbal stimuli, but responds to physical stimuli with withdrawal and resisted movement  Cardiovascular: Regular rate with normal S1 and S2 sounds.  No murmurs, rubs, gallops  Respiratory: Rales bilaterally, particularly worse in the bases.  Abdomen: Soft, nondistended.  Musculoskeletal: Minimal edema.  Warm extremities.  2+ peripheral pulses.  SCDs in place  Data Reviewed: Basic Metabolic Panel: Recent Labs  Lab 06/05/17 0528 06/06/17 0330 06/07/17 0327 06/08/17 0334 06/09/17 0720  NA 137 139 139 139 143  K 4.2 4.1 3.8 4.1 3.8  CL 103 102 102 100* 107  CO2 21* 30 30 30 26   GLUCOSE 141* 158* 154* 156* 161*  BUN 20 30* 30* 25* 28*  CREATININE 0.91 1.41* 1.09 0.97 0.80  CALCIUM 8.9 8.6* 8.6* 8.9 8.7*  PHOS  --   3.4  --   --   --    Liver Function Tests: Recent Labs  Lab 06/04/17 0952 06/05/17 0057 06/05/17 0528 06/06/17 0330 06/08/17 0334  AST 475* 225* 192*  --  34  ALT 382* 231* 218*  --  69*  ALKPHOS 271* 204* 194*  --  106  BILITOT 3.9* 4.9* 5.0*  --  1.3*  PROT 7.1 6.6 6.9  --  6.2*  ALBUMIN 3.5 3.2* 3.3* 2.6* 2.7*   No results for input(s): LIPASE, AMYLASE in the last 168 hours. No results for input(s): AMMONIA in the last 168 hours. CBC: Recent Labs  Lab 06/04/17 0952 06/05/17 0528 06/06/17 0330 06/07/17 0327 06/08/17 0334 06/09/17 0720  WBC 26.8* 28.0* 21.8* 24.9* 20.0* 17.8*  NEUTROABS 25.5*  --   --   --   --   --   HGB 13.2 13.2 12.8* 11.9* 12.1* 13.1  HCT 41.2 41.6 39.2 36.7* 38.6* 38.6*  MCV 72.4* 72.3* 72.2* 72.0* 72.6* 70.1*  PLT 332 265 234 237 240 215   Cardiac Enzymes: Recent Labs  Lab 06/04/17 0952  TROPONINI 0.03*   BNP (last 3 results) Recent Labs    06/04/17 0952  BNP 111.6*    ProBNP (last 3 results) No results for input(s): PROBNP in the last 8760 hours.  CBG: Recent Labs  Lab 06/08/17 0755 06/08/17 1128 06/08/17 1540 06/08/17 2144 06/09/17 0803  GLUCAP 102* 127* 144* 134* 150*    Recent Results (from the past 240 hour(s))  Culture, blood (Routine x 2)     Status: None (Preliminary result)   Collection Time: 06/04/17  9:52 AM  Result Value Ref Range Status   Specimen Description BLOOD RIGHT ANTECUBITAL  Final   Special Requests   Final    BOTTLES DRAWN AEROBIC AND ANAEROBIC Blood Culture adequate volume   Culture   Final    NO GROWTH 4 DAYS Performed at [facility], [place] [address] [place], [place] [address]    Report Status PENDING  Incomplete  Culture, blood (Routine x 2)     Status: None (Preliminary result)   Collection Time: 06/04/17  9:57 AM  Result Value Ref Range Status   Specimen Description BLOOD LEFT ANTECUBITAL  Final   Special Requests   Final    BOTTLES DRAWN AEROBIC AND ANAEROBIC Blood Culture  adequate volume   Culture   Final    NO GROWTH 4 DAYS Performed at [facility], [place] [address], [place], [place] [address]    Report Status PENDING  Incomplete  Urine culture     Status: None   Collection Time: 06/04/17 10:05 AM  Result Value Ref Range Status   Specimen Description URINE, RANDOM  Final   Special Requests NONE  Final   Culture   Final    NO GROWTH Performed at [facility], [place] [address], [place], [place] [address]    Report Status 06/05/2017 FINAL  Final  Culture, blood (x 2)     Status: None (Preliminary result)   Collection Time: 06/05/17  2:02 AM  Result Value Ref Range Status   Specimen Description BLOOD RIGHT HAND  Final  Special Requests IN PEDIATRIC BOTTLE Blood Culture adequate volume  Final   Culture   Final    NO GROWTH 3 DAYS Performed at [facility], [place] [address] [place], [place] [address]    Report Status PENDING  Incomplete  Culture, blood (x 2)     Status: None (Preliminary result)   Collection Time: 06/05/17  5:28 AM  Result Value Ref Range Status   Specimen Description BLOOD LEFT HAND  Final   Special Requests IN PEDIATRIC BOTTLE Blood Culture adequate volume  Final   Culture   Final    NO GROWTH 3 DAYS Performed at [facility], [place] [address], [place], [place] [address]    Report Status PENDING  Incomplete  MRSA PCR Screening     Status: None   Collection Time: 06/05/17  8:06 AM  Result Value Ref Range Status   MRSA by PCR NEGATIVE NEGATIVE Final    Comment:        The GeneXpert MRSA Assay (FDA approved for NASAL specimens only), is one component of a comprehensive MRSA colonization surveillance program. It is not intended to diagnose MRSA infection nor to guide or monitor treatment for MRSA infections.   Respiratory Panel by PCR     Status: None   Collection Time: 06/05/17  4:15 PM  Result Value Ref Range Status   Adenovirus NOT DETECTED NOT DETECTED Final   Coronavirus 229E NOT DETECTED NOT  DETECTED Final   Coronavirus HKU1 NOT DETECTED NOT DETECTED Final   Coronavirus NL63 NOT DETECTED NOT DETECTED Final   Coronavirus OC43 NOT DETECTED NOT DETECTED Final   Metapneumovirus NOT DETECTED NOT DETECTED Final   Rhinovirus / Enterovirus NOT DETECTED NOT DETECTED Final   Influenza A NOT DETECTED NOT DETECTED Final   Influenza B NOT DETECTED NOT DETECTED Final   Parainfluenza Virus 1 NOT DETECTED NOT DETECTED Final   Parainfluenza Virus 2 NOT DETECTED NOT DETECTED Final   Parainfluenza Virus 3 NOT DETECTED NOT DETECTED Final   Parainfluenza Virus 4 NOT DETECTED NOT DETECTED Final   Respiratory Syncytial Virus NOT DETECTED NOT DETECTED Final   Bordetella pertussis NOT DETECTED NOT DETECTED Final   Chlamydophila pneumoniae NOT DETECTED NOT DETECTED Final   Mycoplasma pneumoniae NOT DETECTED NOT DETECTED Final     Studies: No results found.  Scheduled Meds: . chlorhexidine  15 mL Mouth Rinse BID  . enoxaparin (LOVENOX) injection  40 mg Subcutaneous Q24H  . insulin glargine  10 Units Subcutaneous Daily  . levalbuterol  0.63 mg Nebulization Q6H  . mouth rinse  15 mL Mouth Rinse q12n4p  . methylPREDNISolone (SOLU-MEDROL) injection  60 mg Intravenous Q6H  . metoprolol tartrate  5 mg Intravenous Q6H  . scopolamine  1 patch Transdermal Q72H   Continuous Infusions: . sodium chloride 100 mL/hr at 06/09/17 0900  . famotidine (PEPCID) IV Stopped (06/08/17 2154)  . piperacillin-tazobactam (ZOSYN)  IV 3.375 g (06/09/17 0718)  . sodium chloride        Time spent: 30 minutes    Truett Mainland  [facility] Pager: [phone] 06/09/2017, 9:24 AM  LOS: 5 days

## 2017-06-09 NOTE — Progress Notes (Signed)
Pharmacy Antibiotic Note  Douglas Mckee is a 84 y.o. male admitted on 06/04/2017 with cholecystitis.  Pharmacy has been consulted for zosyn dosing.  06/09/2017 Scr 0.8, CrCl ~ 19mls/min WBC 17.8 afebrile  Plan: Continue zosyn 3.375mg  IV q8h extended interval Continue to follow renal function and clinical course  Height: 5\' 5"  (165.1 cm) Weight: 167 lb (75.8 kg) IBW/kg (Calculated) : 61.5  Temp (24hrs), Avg:98.1 F (36.7 C), Min:97.3 F (36.3 C), Max:98.6 F (37 C)  Recent Labs  Lab 06/04/17 1015 06/04/17 1255 06/05/17 0057 06/05/17 0528 06/06/17 0330 06/06/17 0645 06/07/17 0327 06/08/17 0334 06/09/17 0720  WBC  --   --   --  28.0* 21.8*  --  24.9* 20.0* 17.8*  CREATININE  --   --  0.84 0.91 1.41*  --  1.09 0.97 0.80  LATICACIDVEN 8.67* 7.94* 6.4* 5.6*  --   --   --   --   --   VANCORANDOM  --   --   --   --   --  7  --   --   --     Estimated Creatinine Clearance: 56 mL/min (by C-G formula based on SCr of 0.8 mg/dL).    No Known Allergies  Antimicrobials this admission: 1/7 azithromycin x 1 1/7 ceftriaxone x 1 1/7 Flagyl x 1 1/7 Zosyn>> 1/8 vancomycin >> 1/9  Dose adjustments this admission:   Microbiology results: 1/7BCx: NGTD 1/7 UCx:NGF 1/8 BCx: NGTD 1/8 sputum:  1/8 MRSA PCR: neg 1/8 Strep Ag: neg 1/8 resp panel: neg Thank you for allowing pharmacy to be a part of this patient's care.  Dolly Rias RPh 06/09/2017, 10:47 AM Pager [phone]

## 2017-06-10 ENCOUNTER — Inpatient Hospital Stay (HOSPITAL_COMMUNITY): Payer: Medicare Other

## 2017-06-10 LAB — BASIC METABOLIC PANEL
Anion gap: 6 (ref 5–15)
BUN: 32 mg/dL — AB (ref 6–20)
CHLORIDE: 113 mmol/L — AB (ref 101–111)
CO2: 29 mmol/L (ref 22–32)
Calcium: 8.3 mg/dL — ABNORMAL LOW (ref 8.9–10.3)
Creatinine, Ser: 0.88 mg/dL (ref 0.61–1.24)
GFR calc Af Amer: >60 mL/min (ref >60)
GFR calc non Af Amer: >60 mL/min (ref >60)
GLUCOSE: 137 mg/dL — AB (ref 65–99)
POTASSIUM: 3.4 mmol/L — AB (ref 3.5–5.1)
Sodium: 148 mmol/L — ABNORMAL HIGH (ref 135–145)

## 2017-06-10 LAB — PHOSPHORUS: Phosphorus: 2.1 mg/dL — ABNORMAL LOW (ref 2.5–4.6)

## 2017-06-10 LAB — GLUCOSE, CAPILLARY
GLUCOSE-CAPILLARY: 112 mg/dL — AB (ref 65–99)
GLUCOSE-CAPILLARY: 115 mg/dL — AB (ref 65–99)
GLUCOSE-CAPILLARY: 127 mg/dL — AB (ref 65–99)
Glucose-Capillary: 106 mg/dL — ABNORMAL HIGH (ref 65–99)

## 2017-06-10 LAB — CBC
HCT: 37.3 % — ABNORMAL LOW (ref 39.0–52.0)
HEMOGLOBIN: 12.4 g/dL — AB (ref 13.0–17.0)
MCH: 23.3 pg — AB (ref 26.0–34.0)
MCHC: 33.2 g/dL (ref 30.0–36.0)
MCV: 70.1 fL — AB (ref 78.0–100.0)
Platelets: 228 10*3/uL (ref 150–400)
RBC: 5.32 MIL/uL (ref 4.22–5.81)
RDW: 17 % — ABNORMAL HIGH (ref 11.5–15.5)
WBC: 16.9 10*3/uL — ABNORMAL HIGH (ref 4.0–10.5)

## 2017-06-10 LAB — CULTURE, BLOOD (ROUTINE X 2)
CULTURE: NO GROWTH
Culture: NO GROWTH
SPECIAL REQUESTS: ADEQUATE
Special Requests: ADEQUATE

## 2017-06-10 LAB — TROPONIN I: TROPONIN I: 0.03 ng/mL — AB (ref <0.03)

## 2017-06-10 LAB — MAGNESIUM: MAGNESIUM: 2.1 mg/dL (ref 1.7–2.4)

## 2017-06-10 MED ORDER — VITAL HIGH PROTEIN PO LIQD
1000.0000 mL | ORAL | Status: DC
  Administered 2017-06-10 (×2): 1000 mL
  Filled 2017-06-10 (×2): qty 1000

## 2017-06-10 MED ORDER — HYDRALAZINE HCL 20 MG/ML IJ SOLN
10.0000 mg | INTRAMUSCULAR | Status: DC | PRN
  Administered 2017-06-10 (×2): 10 mg via INTRAVENOUS
  Filled 2017-06-10 (×2): qty 1

## 2017-06-10 MED ORDER — HYDRALAZINE HCL 20 MG/ML IJ SOLN
20.0000 mg | INTRAMUSCULAR | Status: DC | PRN
  Administered 2017-06-10 – 2017-06-11 (×3): 20 mg via INTRAVENOUS
  Filled 2017-06-10 (×3): qty 1

## 2017-06-10 MED ORDER — LABETALOL HCL 5 MG/ML IV SOLN
10.0000 mg | INTRAVENOUS | Status: DC | PRN
  Administered 2017-06-10 – 2017-06-11 (×5): 10 mg via INTRAVENOUS
  Filled 2017-06-10 (×5): qty 4

## 2017-06-10 MED ORDER — SODIUM CHLORIDE 0.9% FLUSH
10.0000 mL | INTRAVENOUS | Status: DC | PRN
  Administered 2017-06-20: 10 mL
  Filled 2017-06-10: qty 40

## 2017-06-10 MED ORDER — SODIUM CHLORIDE 0.9% FLUSH
10.0000 mL | Freq: Two times a day (BID) | INTRAVENOUS | Status: DC
  Administered 2017-06-10 – 2017-06-13 (×5): 10 mL
  Administered 2017-06-13: 40 mL
  Administered 2017-06-14: 20 mL
  Administered 2017-06-15 – 2017-06-21 (×11): 10 mL
  Administered 2017-06-22: 20 mL
  Administered 2017-06-22 – 2017-06-23 (×2): 10 mL
  Administered 2017-06-23: 20 mL
  Administered 2017-06-24 – 2017-06-25 (×3): 10 mL

## 2017-06-10 MED ORDER — PRO-STAT SUGAR FREE PO LIQD
30.0000 mL | Freq: Two times a day (BID) | ORAL | Status: DC
  Administered 2017-06-11: 30 mL
  Filled 2017-06-10: qty 30

## 2017-06-10 MED ORDER — CHLORHEXIDINE GLUCONATE CLOTH 2 % EX PADS
6.0000 | MEDICATED_PAD | Freq: Every day | CUTANEOUS | Status: DC
  Administered 2017-06-11 – 2017-06-12 (×2): 6 via TOPICAL

## 2017-06-10 NOTE — Progress Notes (Signed)
Peripherally Inserted Central Catheter/Midline Placement  The IV Nurse has discussed with the patient and/or persons authorized to consent for the patient, the purpose of this procedure and the potential benefits and risks involved with this procedure.  The benefits include less needle sticks, lab draws from the catheter, and the patient may be discharged home with the catheter. Risks include, but not limited to, infection, bleeding, blood clot (thrombus formation), and puncture of an artery; nerve damage and irregular heartbeat and possibility to perform a PICC exchange if needed/ordered by physician.  Alternatives to this procedure were also discussed.  Bard Power PICC patient education guide, fact sheet on infection prevention and patient information card has been provided to patient /or left at bedside.  Consent obtained from daughter at bedside from [facility]  Questions denied at time of PICC placement from family.  PICC/Midline Placement Documentation  PICC Double Lumen 06/10/17 PICC Right Brachial 39 cm 0 cm (Active)  Indication for Insertion or Continuance of Line Prolonged intravenous therapies;Limited venous access - need for IV therapy >5 days (PICC only);Poor Vasculature-patient has had multiple peripheral attempts or PIVs lasting less than 24 hours 06/10/2017  6:30 PM  Exposed Catheter (cm) 0 cm 06/10/2017  6:30 PM  Site Assessment Clean;Dry;Intact 06/10/2017  6:30 PM  Lumen #1 Status Flushed;Saline locked;Blood return noted 06/10/2017  6:30 PM  Lumen #2 Status Flushed;Saline locked;Blood return noted 06/10/2017  6:30 PM  Dressing Type Transparent 06/10/2017  6:30 PM  Dressing Status Clean;Dry;Intact;Antimicrobial disc in place 06/10/2017  6:30 PM  Line Care Connections checked and tightened 06/10/2017  6:30 PM  Line Adjustment (NICU/IV Team Only) No 06/10/2017  6:30 PM  Dressing Intervention New dressing 06/10/2017  6:30 PM  Dressing Change Due 06/17/17 06/10/2017  6:30 PM       Rolena Infante 06/10/2017, 6:30 PM

## 2017-06-10 NOTE — Progress Notes (Signed)
[facility] PROGRESS NOTE  Douglas Mckee JKK:[id number] DOB: Sep 12, 1922 DOA: 06/04/2017 PCP: Verline Lema, MD  Brief Summary: 84 year old male with history significant for BPH, hypertension, gout, previous TIA, dementia presented to the ER with several days of progressively worsening dyspnea and acutely worse after episodes of vomiting. In the ER patient was febrile to 101.9, tachycardia, respiratory distress. Elevated lactic acid level of 8.6, transaminitis, leukocytosis and elevated troponin level. Chest x-ray with bilateral airspace disease. Started on antibiotics. Critical care was consulted and admitted in a stepdown unit.  Assessment/Plan: Principal Problem:   Sepsis due to pneumonia [facility]) Active Problems:   Metabolic encephalopathy   BPH (benign prostatic hyperplasia)   Alzheimer's dementia   Demand ischemia (HCC)   Transaminitis   Acute respiratory failure with hypoxemia (HCC)   Pneumonia of both lungs due to infectious organism   Goals of care, counseling/discussion   Palliative care encounter   SOB (shortness of breath)   Pressure injury of skin   Essential hypertension   1. Sepsis  Patient Continues on Zosyn 2. Acute resp failure with hypoxemia  Patient with slow but incremental improvement.   On nasal cannula.   Family desires to continue aggressive medical care, however no aggressive measures such as intubation.  Patient continues to be DNR.  Continue steroids to decrease inflammation 3. PNA bilaterally  ? Aspiration -will need swallow eval if/when he recovers  Continue Zosyn  Respiratory virus panel negative  Blood cultures still pending, however no growth.  Continue Solu-Medrol (on Pepcid IV twice daily for gastric protection)  Difficulty obtaining blood work due to access - will have PICC placed  CBC still pending 4. Transaminitis with Cholelithiasis  On Zosyn 5. Metabolic encephalopathy  Patient has opened eyes, but is not entirely  responsive.  Has some purposeful movements 7. Elevated transaminases  Improved 8.  Hypertension  Difficult to control  Increase metoprolol IV to 10mg  every 6hrs 9. Hyperglycemia  Start lantus 10units daily  Code Status: DNR Family Communication: Discussed with daughter  at bedside Disposition Plan: Pending   Consultants:  Palliative care  CCM  Procedures:  None  Antibiotics:  Zosyn  HPI/Subjective: Patient improving this morning - opening eyes, but not following commands much.  Appears to be breathing comfortably on nasal cannula and maintaining oxygen saturations.    Objective: Vitals:   06/10/17 1000 06/10/17 1200  BP: (!) 186/72   Pulse: 85   Resp: (!) 21   Temp:  98.3 F (36.8 C)  SpO2: 97%     Intake/Output Summary (Last 24 hours) at 06/10/2017 1253 Last data filed at 06/10/2017 1000 Gross per 24 hour  Intake 1550 ml  Output 705 ml  Net 845 ml   Filed Weights   06/04/17 1054  Weight: 75.8 kg (167 lb)    Exam:   General: Awake. No response to verbal stimuli, but responds to physical stimuli with withdrawal and resisted movement  Cardiovascular: Regular rate with normal S1 and S2 sounds.  No murmurs, rubs, gallops  Respiratory: Continues to have rales bilaterally, particularly worse in the bases.  Abdomen: Soft, nontender, nondistended.  Musculoskeletal: Minimal edema.  Warm extremities.  2+ peripheral pulses.  SCDs in place  Data Reviewed: Basic Metabolic Panel: Recent Labs  Lab 06/05/17 0528 06/06/17 0330 06/07/17 0327 06/08/17 0334 06/09/17 0720  NA 137 139 139 139 143  K 4.2 4.1 3.8 4.1 3.8  CL 103 102 102 100* 107  CO2 21* 30 30 30 26   GLUCOSE 141* 158*  154* 156* 161*  BUN 20 30* 30* 25* 28*  CREATININE 0.91 1.41* 1.09 0.97 0.80  CALCIUM 8.9 8.6* 8.6* 8.9 8.7*  PHOS  --  3.4  --   --   --    Liver Function Tests: Recent Labs  Lab 06/04/17 0952 06/05/17 0057 06/05/17 0528 06/06/17 0330 06/08/17 0334  AST 475* 225*  192*  --  34  ALT 382* 231* 218*  --  69*  ALKPHOS 271* 204* 194*  --  106  BILITOT 3.9* 4.9* 5.0*  --  1.3*  PROT 7.1 6.6 6.9  --  6.2*  ALBUMIN 3.5 3.2* 3.3* 2.6* 2.7*   No results for input(s): LIPASE, AMYLASE in the last 168 hours. No results for input(s): AMMONIA in the last 168 hours. CBC: Recent Labs  Lab 06/04/17 0952 06/05/17 0528 06/06/17 0330 06/07/17 0327 06/08/17 0334 06/09/17 0720  WBC 26.8* 28.0* 21.8* 24.9* 20.0* 17.8*  NEUTROABS 25.5*  --   --   --   --   --   HGB 13.2 13.2 12.8* 11.9* 12.1* 13.1  HCT 41.2 41.6 39.2 36.7* 38.6* 38.6*  MCV 72.4* 72.3* 72.2* 72.0* 72.6* 70.1*  PLT 332 265 234 237 240 215   Cardiac Enzymes: Recent Labs  Lab 06/04/17 0952  TROPONINI 0.03*   BNP (last 3 results) Recent Labs    06/04/17 0952  BNP 111.6*    ProBNP (last 3 results) No results for input(s): PROBNP in the last 8760 hours.  CBG: Recent Labs  Lab 06/09/17 1150 06/09/17 1540 06/09/17 2141 06/10/17 0743 06/10/17 1121  GLUCAP 178* 116* 109* 112* 106*    Recent Results (from the past 240 hour(s))  Culture, blood (Routine x 2)     Status: None   Collection Time: 06/04/17  9:52 AM  Result Value Ref Range Status   Specimen Description BLOOD RIGHT ANTECUBITAL  Final   Special Requests   Final    BOTTLES DRAWN AEROBIC AND ANAEROBIC Blood Culture adequate volume   Culture   Final    NO GROWTH 5 DAYS Performed at [facility], [place] [address], [place], [place] [address]    Report Status 06/09/2017 FINAL  Final  Culture, blood (Routine x 2)     Status: None   Collection Time: 06/04/17  9:57 AM  Result Value Ref Range Status   Specimen Description BLOOD LEFT ANTECUBITAL  Final   Special Requests   Final    BOTTLES DRAWN AEROBIC AND ANAEROBIC Blood Culture adequate volume   Culture   Final    NO GROWTH 5 DAYS Performed at [facility], [place] [address] [place], [place] [address]    Report Status 06/09/2017 FINAL  Final  Urine culture      Status: None   Collection Time: 06/04/17 10:05 AM  Result Value Ref Range Status   Specimen Description URINE, RANDOM  Final   Special Requests NONE  Final   Culture   Final    NO GROWTH Performed at [facility], [place] [address], [place], [place] [address]    Report Status 06/05/2017 FINAL  Final  Culture, blood (x 2)     Status: None (Preliminary result)   Collection Time: 06/05/17  2:02 AM  Result Value Ref Range Status   Specimen Description BLOOD RIGHT HAND  Final   Special Requests IN PEDIATRIC BOTTLE Blood Culture adequate volume  Final   Culture   Final    NO GROWTH 4 DAYS Performed  at [facility], [place] [address] [place], [place] [address]    Report Status PENDING  Incomplete  Culture, blood (x 2)     Status: None (Preliminary result)   Collection Time: 06/05/17  5:28 AM  Result Value Ref Range Status   Specimen Description BLOOD LEFT HAND  Final   Special Requests IN PEDIATRIC BOTTLE Blood Culture adequate volume  Final   Culture   Final    NO GROWTH 4 DAYS Performed at [facility], [place] [address] [place], [place] [address]    Report Status PENDING  Incomplete  MRSA PCR Screening     Status: None   Collection Time: 06/05/17  8:06 AM  Result Value Ref Range Status   MRSA by PCR NEGATIVE NEGATIVE Final    Comment:        The GeneXpert MRSA Assay (FDA approved for NASAL specimens only), is one component of a comprehensive MRSA colonization surveillance program. It is not intended to diagnose MRSA infection nor to guide or monitor treatment for MRSA infections.   Respiratory Panel by PCR     Status: None   Collection Time: 06/05/17  4:15 PM  Result Value Ref Range Status   Adenovirus NOT DETECTED NOT DETECTED Final   Coronavirus 229E NOT DETECTED NOT DETECTED Final   Coronavirus HKU1 NOT DETECTED NOT DETECTED Final   Coronavirus NL63 NOT DETECTED NOT DETECTED Final   Coronavirus OC43 NOT DETECTED NOT DETECTED Final   Metapneumovirus NOT  DETECTED NOT DETECTED Final   Rhinovirus / Enterovirus NOT DETECTED NOT DETECTED Final   Influenza A NOT DETECTED NOT DETECTED Final   Influenza B NOT DETECTED NOT DETECTED Final   Parainfluenza Virus 1 NOT DETECTED NOT DETECTED Final   Parainfluenza Virus 2 NOT DETECTED NOT DETECTED Final   Parainfluenza Virus 3 NOT DETECTED NOT DETECTED Final   Parainfluenza Virus 4 NOT DETECTED NOT DETECTED Final   Respiratory Syncytial Virus NOT DETECTED NOT DETECTED Final   Bordetella pertussis NOT DETECTED NOT DETECTED Final   Chlamydophila pneumoniae NOT DETECTED NOT DETECTED Final   Mycoplasma pneumoniae NOT DETECTED NOT DETECTED Final     Studies: No results found.  Scheduled Meds: . chlorhexidine  15 mL Mouth Rinse BID  . enoxaparin (LOVENOX) injection  40 mg Subcutaneous Q24H  . insulin glargine  10 Units Subcutaneous Daily  . levalbuterol  0.63 mg Nebulization Q6H  . mouth rinse  15 mL Mouth Rinse q12n4p  . methylPREDNISolone (SOLU-MEDROL) injection  60 mg Intravenous Q6H  . metoprolol tartrate  10 mg Intravenous Q6H  . scopolamine  1 patch Transdermal Q72H   Continuous Infusions: . sodium chloride 125 mL/hr at 06/10/17 0811  . famotidine (PEPCID) IV Stopped (06/10/17 0300)  . piperacillin-tazobactam (ZOSYN)  IV Stopped (06/10/17 1146)  . sodium chloride        Time spent: 30 minutes    Truett Mainland  [facility] Pager: [phone] 06/10/2017, 12:53 PM  LOS: 6 days

## 2017-06-11 DIAGNOSIS — I10 Essential (primary) hypertension: Secondary | ICD-10-CM

## 2017-06-11 LAB — BASIC METABOLIC PANEL
Anion gap: 4 — ABNORMAL LOW (ref 5–15)
BUN: 38 mg/dL — AB (ref 6–20)
CO2: 28 mmol/L (ref 22–32)
CREATININE: 0.84 mg/dL (ref 0.61–1.24)
Calcium: 8.3 mg/dL — ABNORMAL LOW (ref 8.9–10.3)
Chloride: 117 mmol/L — ABNORMAL HIGH (ref 101–111)
GFR calc Af Amer: >60 mL/min (ref >60)
GFR calc non Af Amer: >60 mL/min (ref >60)
Glucose, Bld: 223 mg/dL — ABNORMAL HIGH (ref 65–99)
Potassium: 3.2 mmol/L — ABNORMAL LOW (ref 3.5–5.1)
Sodium: 149 mmol/L — ABNORMAL HIGH (ref 135–145)

## 2017-06-11 LAB — PHOSPHORUS: PHOSPHORUS: 2.2 mg/dL — AB (ref 2.5–4.6)

## 2017-06-11 LAB — GLUCOSE, CAPILLARY
GLUCOSE-CAPILLARY: 172 mg/dL — AB (ref 65–99)
GLUCOSE-CAPILLARY: 194 mg/dL — AB (ref 65–99)
GLUCOSE-CAPILLARY: 177 mg/dL — AB (ref 65–99)
Glucose-Capillary: 163 mg/dL — ABNORMAL HIGH (ref 65–99)

## 2017-06-11 LAB — CBC
HEMATOCRIT: 37.2 % — AB (ref 39.0–52.0)
Hemoglobin: 12.2 g/dL — ABNORMAL LOW (ref 13.0–17.0)
MCH: 23 pg — AB (ref 26.0–34.0)
MCHC: 32.8 g/dL (ref 30.0–36.0)
MCV: 70.2 fL — ABNORMAL LOW (ref 78.0–100.0)
Platelets: 242 10*3/uL (ref 150–400)
RBC: 5.3 MIL/uL (ref 4.22–5.81)
RDW: 17 % — ABNORMAL HIGH (ref 11.5–15.5)
WBC: 19.3 10*3/uL — ABNORMAL HIGH (ref 4.0–10.5)

## 2017-06-11 LAB — MAGNESIUM: Magnesium: 2 mg/dL (ref 1.7–2.4)

## 2017-06-11 MED ORDER — METOPROLOL TARTRATE 5 MG/5ML IV SOLN
5.0000 mg | Freq: Four times a day (QID) | INTRAVENOUS | Status: DC
  Administered 2017-06-11 – 2017-06-20 (×36): 5 mg via INTRAVENOUS
  Filled 2017-06-11 (×37): qty 5

## 2017-06-11 MED ORDER — FUROSEMIDE 10 MG/ML IJ SOLN
40.0000 mg | Freq: Two times a day (BID) | INTRAMUSCULAR | Status: DC
  Administered 2017-06-11 – 2017-06-13 (×4): 40 mg via INTRAVENOUS
  Filled 2017-06-11 (×4): qty 4

## 2017-06-11 MED ORDER — OSMOLITE 1.2 CAL PO LIQD
1000.0000 mL | ORAL | Status: DC
  Administered 2017-06-11 – 2017-06-21 (×9): 1000 mL
  Administered 2017-06-23: 50 mL/h
  Administered 2017-06-24 – 2017-06-25 (×2): 1000 mL
  Filled 2017-06-11 (×2): qty 1000

## 2017-06-11 MED ORDER — METHYLPREDNISOLONE SODIUM SUCC 40 MG IJ SOLR
40.0000 mg | Freq: Three times a day (TID) | INTRAMUSCULAR | Status: DC
  Administered 2017-06-11 – 2017-06-12 (×2): 40 mg via INTRAVENOUS
  Filled 2017-06-11 (×2): qty 1

## 2017-06-11 MED ORDER — HYDRALAZINE HCL 20 MG/ML IJ SOLN
5.0000 mg | INTRAMUSCULAR | Status: DC | PRN
  Administered 2017-06-11 – 2017-06-12 (×2): 5 mg via INTRAVENOUS
  Filled 2017-06-11 (×2): qty 1

## 2017-06-11 MED ORDER — JEVITY 1.2 CAL PO LIQD: 1000.0000 mL | ORAL | Status: DC

## 2017-06-11 MED ORDER — FREE WATER
50.0000 mL | Status: DC
  Administered 2017-06-11 – 2017-06-25 (×76): 50 mL

## 2017-06-11 NOTE — Plan of Care (Signed)
  Nutrition: Adequate nutrition will be maintained, osmolite in progress 06/11/2017 2111 - Progressing by Mickie Kay, RN

## 2017-06-11 NOTE — Progress Notes (Addendum)
Initial Nutrition Assessment  DOCUMENTATION CODES:   Not applicable  INTERVENTION:  - Will order for new weight today. - Will order Osmolite 1.2 @ 10 mL/hr to increase by 10 mL every 12 hours to reach goal rate of Osmolite 1.2 @ 55 mL/hr. At goal rate, this regimen will provide 1584 kcal, 73 grams of protein, and 1082 mL free water. - Will order 50 mL free water every 4 hours (300 mL/day).  Monitor magnesium, potassium, and phosphorus daily for at least 3 days, MD to replete as needed, as pt is at risk for refeeding syndrome given no nutrition x7 days, unsure of nutrition status PTA, current hypokalemia and hypophosphatemia.   NUTRITION DIAGNOSIS:   Inadequate oral intake related to inability to eat as evidenced by NPO status.   GOAL:   Patient will meet greater than or equal to 90% of their needs  MONITOR:   TF tolerance, Weight trends, Labs, Skin  REASON FOR ASSESSMENT:   Consult Enteral/tube feeding initiation and management  ASSESSMENT:   84 year old male with history significant for BPH, hypertension, gout, previous TIA, dementia presented to the ER with several days of progressively worsening dyspnea and acutely worse after episodes of vomiting.  In the ER patient was febrile to 101.9, tachycardia, respiratory distress.  Elevated lactic acid level of 8.6, transaminitis, leukocytosis and elevated troponin level.  Chest x-ray with bilateral airspace disease.   BMI indicates normal weight. Pt has been NPO since admission (06/04/17). Pt at high risk for re-feeding given this. NGT placed yesterday and imaging report states that tip of the tube is in the body of the stomach. Pt with sepsis; increased nutrition needs d/t this. He also has bilateral PNA, transaminitis with cholelithiasis, metabolic encephalopathy (unable to provide any information), and has been having hyperglycemia.   Per chart review, weight on admission was consistent with weight from as far back as 08/17/15. Will  obtain new weight to re-assess.  Medications reviewed; 20 mg IV Pepcid BID, 10 units Lantus/day, 60 mg Solu-medrol QID. Labs reviewed; CBG: 172 mg/dL this AM, Na: 148 mmol/L, K: 3.4 mmol/L, Cl: 113 mmol/L, BUN: 32 mg/dL, Phos: 2.1 mg/dL.  IVF: NS @ 125 mL/hr.        NUTRITION - FOCUSED PHYSICAL EXAM:  Unable to complete/assess at this time, but will attempt at follow-up.   Diet Order:  Diet NPO time specified  EDUCATION NEEDS:   No education needs have been identified at this time  Skin:  Skin Assessment: Skin Integrity Issues: Skin Integrity Issues:: Stage I Stage I: coccyx  Last BM:  1/8  Height:   Ht Readings from Last 1 Encounters:  06/04/17 5\' 5"  (1.651 m)    Weight:   Wt Readings from Last 1 Encounters:  06/04/17 167 lb (75.8 kg)    Ideal Body Weight:  61.82 kg  BMI:  Body mass index is 27.79 kg/m.  Estimated Nutritional Needs:   Kcal:  1515-1745 (20-23 kcal/kg)  Protein:  60-70 grams  Fluid:  >/= 1.6 L/day      Jarome Matin, MS, RD, LDN, [facility] Clinical Dietitian Pager # [phone] After hours/weekend pager # [phone]

## 2017-06-11 NOTE — Progress Notes (Signed)
Date:  June 11, 2017 Chart reviewed for concurrent status and case management needs.  Will continue to follow patient progress. dypnea remains on nebs and iv solumedrol. Discharge Planning: following for needs.  None present at this time of review. Expected discharge date: January 172019 Rhonda Davis, BSN, [place], [place]

## 2017-06-11 NOTE — Progress Notes (Signed)
Patient ID: Douglas Mckee, male   DOB: [birth year], 84 y.o.   MRN: [id number]  PROGRESS NOTE    Douglas Mckee  EQA:[id number] DOB: 03-16-1923 DOA: 06/04/2017 PCP: Verline Lema, MD   Brief Narrative:  84 year old male with history of BPH, hypertension, gout, previous TIA, dementia presented to the ER with several days of progressively worsening dyspnea along with vomiting.  He was found to be febrile, tachycardic with elevated lactic acid, leukocytosis chest x-ray showed bilateral airspace disease.  He was started on intravenous antibiotics.  Critical care was consulted and patient was admitted in stepdown unit.   Assessment & Plan:   Principal Problem:   Sepsis due to pneumonia [facility]) Active Problems:   Metabolic encephalopathy   BPH (benign prostatic hyperplasia)   Alzheimer's dementia   Demand ischemia (HCC)   Transaminitis   Acute respiratory failure with hypoxemia (HCC)   Pneumonia of both lungs due to infectious organism   Goals of care, counseling/discussion   Palliative care encounter   SOB (shortness of breath)   Pressure injury of skin   Essential hypertension    1. Sepsis ? Resolved.  Hemodynamically stable.  Currently on Zosyn.  Cultures negative so far 2. Acute resp failure with hypoxia ? Initially required BiPAP.  Currently on nasal cannula.  Wean off oxygen as able. Patient with slow improvement. PCCM has signed off. ? Family desires to continue aggressive medical care, however no aggressive measures such as intubation.  Patient continues to be DNR. ? Decrease Solu-Medrol to 40 mg IV every 8 hours.  Steroid was empirically started to decrease  Inflammation ? And patient might need to be put back on Lasix.  Repeat chest x-ray for tomorrow 3. Bilateral pneumonia ? ? Aspiration -patient apparently is more awake intermittently.  Will get SLP evaluation.  Currently getting NG tube feeding ? Today's day #8 of antibiotics.  Discontinue Zosyn ? Respiratory virus panel  negative ? Blood cultures negative so far. 4. Transaminitis with Cholelithiasis ? LFTs were improving.  Repeat a.m. LFTs.  Doubt that the patient would be a surgical candidate 5. Metabolic encephalopathy ? Slightly awake but does not answer much questions.  Family thinks patient is getting better. 6.  Hypertension ? Blood pressure intermittently still high.  Continue PRN IV antihypertensives 7. Hyperglycemia ? Probably reactive from steroid use.  Monitor.  Continue Lantus. 8.  Generalized deconditioning      -Overall prognosis is guarded to poor.  Palliative care team following peripherally.  If condition worsens, might benefit from hospice/comfort measures.  DVT prophylaxis: Lovenox Code Status: DNR Family Communication: Discussed with daughter and grandson at bedside Disposition Plan: Depends on clinical outcome  Consultants: PCCM Palliative care  Procedures: None  Antimicrobials: Zosyn from 06/04/2017 onwards   Subjective: Patient seen and examined at bedside.  He is sleepy, wakes up slightly on calling his name but does not answer much questions.  Spoke to daughter and grandson at bedside.  No overnight fever or vomiting  Objective: Vitals:   06/11/17 0728 06/11/17 0800 06/11/17 1113 06/11/17 1200  BP:   (!) 183/61   Pulse:      Resp:      Temp:  99.7 F (37.6 C)  99.5 F (37.5 C)  TempSrc:  Oral  Axillary  SpO2: 99%     Weight:      Height:        Intake/Output Summary (Last 24 hours) at 06/11/2017 1331 Last data filed at 06/11/2017 1236 Gross per 24 hour  Intake 1625 ml  Output 1050 ml  Net 575 ml   Filed Weights   06/04/17 1054  Weight: 75.8 kg (167 lb)    Examination:  General exam: Elderly male lying in bed; no distress.  Wakes up but does not answer questions  respiratory system: Bilateral decreased breath sound at bases Cardiovascular system: S1 & S2 heard, rate controlled.  Gastrointestinal system: Abdomen is nondistended, soft and nontender.  Normal bowel sounds heard. Extremities: No cyanosis, clubbing, edema   Data Reviewed: I have personally reviewed following labs and imaging studies  CBC: Recent Labs  Lab 06/07/17 0327 06/08/17 0334 06/09/17 0720 06/10/17 1820 06/11/17 1048  WBC 24.9* 20.0* 17.8* 16.9* 19.3*  HGB 11.9* 12.1* 13.1 12.4* 12.2*  HCT 36.7* 38.6* 38.6* 37.3* 37.2*  MCV 72.0* 72.6* 70.1* 70.1* 70.2*  PLT 237 240 215 228 062   Basic Metabolic Panel: Recent Labs  Lab 06/06/17 0330 06/07/17 0327 06/08/17 0334 06/09/17 0720 06/10/17 1820 06/11/17 1048  NA 139 139 139 143 148* 149*  K 4.1 3.8 4.1 3.8 3.4* 3.2*  CL 102 102 100* 107 113* 117*  CO2 30 30 30 26 29 28   GLUCOSE 158* 154* 156* 161* 137* 223*  BUN 30* 30* 25* 28* 32* 38*  CREATININE 1.41* 1.09 0.97 0.80 0.88 0.84  CALCIUM 8.6* 8.6* 8.9 8.7* 8.3* 8.3*  MG  --   --   --   --  2.1 2.0  PHOS 3.4  --   --   --  2.1* 2.2*   GFR: Estimated Creatinine Clearance: 53.3 mL/min (by C-G formula based on SCr of 0.84 mg/dL). Liver Function Tests: Recent Labs  Lab 06/05/17 0057 06/05/17 0528 06/06/17 0330 06/08/17 0334  AST 225* 192*  --  34  ALT 231* 218*  --  69*  ALKPHOS 204* 194*  --  106  BILITOT 4.9* 5.0*  --  1.3*  PROT 6.6 6.9  --  6.2*  ALBUMIN 3.2* 3.3* 2.6* 2.7*   No results for input(s): LIPASE, AMYLASE in the last 168 hours. No results for input(s): AMMONIA in the last 168 hours. Coagulation Profile: Recent Labs  Lab 06/05/17 0057  INR 1.41   Cardiac Enzymes: Recent Labs  Lab 06/10/17 1820  TROPONINI 0.03*   BNP (last 3 results) No results for input(s): PROBNP in the last 8760 hours. HbA1C: No results for input(s): HGBA1C in the last 72 hours. CBG: Recent Labs  Lab 06/10/17 1121 06/10/17 1718 06/10/17 2118 06/11/17 0727 06/11/17 1145  GLUCAP 106* 115* 127* 172* 194*   Lipid Profile: No results for input(s): CHOL, HDL, LDLCALC, TRIG, CHOLHDL, LDLDIRECT in the last 72 hours. Thyroid Function Tests: No  results for input(s): TSH, T4TOTAL, FREET4, T3FREE, THYROIDAB in the last 72 hours. Anemia Panel: No results for input(s): VITAMINB12, FOLATE, FERRITIN, TIBC, IRON, RETICCTPCT in the last 72 hours. Sepsis Labs: Recent Labs  Lab 06/05/17 0057 06/05/17 0528  PROCALCITON 63.16  --   LATICACIDVEN 6.4* 5.6*    Recent Results (from the past 240 hour(s))  Culture, blood (Routine x 2)     Status: None   Collection Time: 06/04/17  9:52 AM  Result Value Ref Range Status   Specimen Description BLOOD RIGHT ANTECUBITAL  Final   Special Requests   Final    BOTTLES DRAWN AEROBIC AND ANAEROBIC Blood Culture adequate volume   Culture   Final    NO GROWTH 5 DAYS Performed at [facility], [address] [place], [place] [address]  Report Status 06/09/2017 FINAL  Final  Culture, blood (Routine x 2)     Status: None   Collection Time: 06/04/17  9:57 AM  Result Value Ref Range Status   Specimen Description BLOOD LEFT ANTECUBITAL  Final   Special Requests   Final    BOTTLES DRAWN AEROBIC AND ANAEROBIC Blood Culture adequate volume   Culture   Final    NO GROWTH 5 DAYS Performed at [facility], [address], [place], [place] [address]    Report Status 06/09/2017 FINAL  Final  Urine culture     Status: None   Collection Time: 06/04/17 10:05 AM  Result Value Ref Range Status   Specimen Description URINE, RANDOM  Final   Special Requests NONE  Final   Culture   Final    NO GROWTH Performed at [facility], [place] [address] [place], [place] [address]    Report Status 06/05/2017 FINAL  Final  Culture, blood (x 2)     Status: None   Collection Time: 06/05/17  2:02 AM  Result Value Ref Range Status   Specimen Description BLOOD RIGHT HAND  Final   Special Requests IN PEDIATRIC BOTTLE Blood Culture adequate volume  Final   Culture   Final    NO GROWTH 5 DAYS Performed at [facility], [place] [address] [place], [place] [address]    Report Status 06/10/2017 FINAL   Final  Culture, blood (x 2)     Status: None   Collection Time: 06/05/17  5:28 AM  Result Value Ref Range Status   Specimen Description BLOOD LEFT HAND  Final   Special Requests IN PEDIATRIC BOTTLE Blood Culture adequate volume  Final   Culture   Final    NO GROWTH 5 DAYS Performed at [facility], [place] [address] [place], [place] [address]    Report Status 06/10/2017 FINAL  Final  MRSA PCR Screening     Status: None   Collection Time: 06/05/17  8:06 AM  Result Value Ref Range Status   MRSA by PCR NEGATIVE NEGATIVE Final    Comment:        The GeneXpert MRSA Assay (FDA approved for NASAL specimens only), is one component of a comprehensive MRSA colonization surveillance program. It is not intended to diagnose MRSA infection nor to guide or monitor treatment for MRSA infections.   Respiratory Panel by PCR     Status: None   Collection Time: 06/05/17  4:15 PM  Result Value Ref Range Status   Adenovirus NOT DETECTED NOT DETECTED Final   Coronavirus 229E NOT DETECTED NOT DETECTED Final   Coronavirus HKU1 NOT DETECTED NOT DETECTED Final   Coronavirus NL63 NOT DETECTED NOT DETECTED Final   Coronavirus OC43 NOT DETECTED NOT DETECTED Final   Metapneumovirus NOT DETECTED NOT DETECTED Final   Rhinovirus / Enterovirus NOT DETECTED NOT DETECTED Final   Influenza A NOT DETECTED NOT DETECTED Final   Influenza B NOT DETECTED NOT DETECTED Final   Parainfluenza Virus 1 NOT DETECTED NOT DETECTED Final   Parainfluenza Virus 2 NOT DETECTED NOT DETECTED Final   Parainfluenza Virus 3 NOT DETECTED NOT DETECTED Final   Parainfluenza Virus 4 NOT DETECTED NOT DETECTED Final   Respiratory Syncytial Virus NOT DETECTED NOT DETECTED Final   Bordetella pertussis NOT DETECTED NOT DETECTED Final   Chlamydophila pneumoniae NOT DETECTED NOT DETECTED Final   Mycoplasma pneumoniae NOT DETECTED NOT DETECTED Final  Radiology Studies: Dg Chest Port 1 View  Result Date: 06/10/2017 CLINICAL  DATA:  Feeding tube placement. EXAM: CHEST 1 VIEW COMPARISON:  06/05/2017 FINDINGS: The feeding tube tip is in the body region of the stomach. Right PICC line tip is in the distal SVC. Persistent diffuse interstitial process and pleural effusions which could represent edema or infiltrates. The left upper lobe consolidation has significantly improved. IMPRESSION: The feeding tube tip is in the body region of the stomach. Right PICC line tip is in the distal SVC. Improving/resolving left upper lobe pneumonia. Persistent diffuse interstitial process, effusions and basilar atelectasis. Electronically Signed   By: Marijo Sanes M.D.   On: 06/10/2017 19:03   Dg Abd Portable 1v  Result Date: 06/10/2017 CLINICAL DATA:  Feeding tube placement. EXAM: ABDOMEN - 1 VIEW COMPARISON:  None. FINDINGS: The feeding tube tip is in the body region of the stomach. The bowel gas pattern is unremarkable. The bony structures are intact. IMPRESSION: Feeding tube tip in the body region of the stomach. Electronically Signed   By: Marijo Sanes M.D.   On: 06/10/2017 19:03        Scheduled Meds: . chlorhexidine  15 mL Mouth Rinse BID  . Chlorhexidine Gluconate Cloth  6 each Topical Daily  . enoxaparin (LOVENOX) injection  40 mg Subcutaneous Q24H  . feeding supplement (OSMOLITE 1.2 CAL)  1,000 mL Per Tube Q24H  . free water  50 mL Per Tube Q4H  . insulin glargine  10 Units Subcutaneous Daily  . levalbuterol  0.63 mg Nebulization Q6H  . mouth rinse  15 mL Mouth Rinse q12n4p  . methylPREDNISolone (SOLU-MEDROL) injection  60 mg Intravenous Q6H  . metoprolol tartrate  10 mg Intravenous Q6H  . scopolamine  1 patch Transdermal Q72H  . sodium chloride flush  10-40 mL Intracatheter Q12H   Continuous Infusions: . sodium chloride 125 mL/hr at 06/10/17 2115  . famotidine (PEPCID) IV Stopped (06/11/17 1130)  . sodium chloride       LOS: 7 days        Aline August, MD [facility] Pager [phone]  If  7PM-7AM, please contact night-coverage [email or web address] Password [email or web address] 06/11/2017, 1:31 PM

## 2017-06-12 ENCOUNTER — Inpatient Hospital Stay (HOSPITAL_COMMUNITY): Payer: Medicare Other

## 2017-06-12 DIAGNOSIS — G309 Alzheimer's disease, unspecified: Secondary | ICD-10-CM

## 2017-06-12 DIAGNOSIS — F0280 Dementia in other diseases classified elsewhere without behavioral disturbance: Secondary | ICD-10-CM

## 2017-06-12 LAB — MAGNESIUM: Magnesium: 2.1 mg/dL (ref 1.7–2.4)

## 2017-06-12 LAB — GLUCOSE, CAPILLARY
GLUCOSE-CAPILLARY: 143 mg/dL — AB (ref 65–99)
Glucose-Capillary: 172 mg/dL — ABNORMAL HIGH (ref 65–99)
Glucose-Capillary: 182 mg/dL — ABNORMAL HIGH (ref 65–99)
Glucose-Capillary: 164 mg/dL — ABNORMAL HIGH (ref 65–99)

## 2017-06-12 LAB — COMPREHENSIVE METABOLIC PANEL
ALK PHOS: 87 U/L (ref 38–126)
ALT: 35 U/L (ref 17–63)
AST: 26 U/L (ref 15–41)
Albumin: 2.4 g/dL — ABNORMAL LOW (ref 3.5–5.0)
Anion gap: 8 (ref 5–15)
BILIRUBIN TOTAL: 1.1 mg/dL (ref 0.3–1.2)
BUN: 37 mg/dL — AB (ref 6–20)
CALCIUM: 8.4 mg/dL — AB (ref 8.9–10.3)
CO2: 30 mmol/L (ref 22–32)
CREATININE: 0.9 mg/dL (ref 0.61–1.24)
Chloride: 113 mmol/L — ABNORMAL HIGH (ref 101–111)
GFR calc Af Amer: >60 mL/min (ref >60)
GLUCOSE: 192 mg/dL — AB (ref 65–99)
Potassium: 3.1 mmol/L — ABNORMAL LOW (ref 3.5–5.1)
Sodium: 151 mmol/L — ABNORMAL HIGH (ref 135–145)
TOTAL PROTEIN: 5.4 g/dL — AB (ref 6.5–8.1)

## 2017-06-12 LAB — CBC
HEMATOCRIT: 38.4 % — AB (ref 39.0–52.0)
HEMOGLOBIN: 12.8 g/dL — AB (ref 13.0–17.0)
MCH: 23.3 pg — ABNORMAL LOW (ref 26.0–34.0)
MCHC: 33.3 g/dL (ref 30.0–36.0)
MCV: 69.9 fL — AB (ref 78.0–100.0)
Platelets: 233 10*3/uL (ref 150–400)
RBC: 5.49 MIL/uL (ref 4.22–5.81)
RDW: 17.2 % — ABNORMAL HIGH (ref 11.5–15.5)
WBC: 28.5 10*3/uL — ABNORMAL HIGH (ref 4.0–10.5)

## 2017-06-12 MED ORDER — POTASSIUM CHLORIDE 10 MEQ/100ML IV SOLN
10.0000 meq | INTRAVENOUS | Status: AC
  Administered 2017-06-12 (×4): 10 meq via INTRAVENOUS
  Filled 2017-06-12 (×5): qty 100

## 2017-06-12 MED ORDER — METHYLPREDNISOLONE SODIUM SUCC 40 MG IJ SOLR
40.0000 mg | Freq: Two times a day (BID) | INTRAMUSCULAR | Status: DC
  Administered 2017-06-12 – 2017-06-13 (×2): 40 mg via INTRAVENOUS
  Filled 2017-06-12 (×2): qty 1

## 2017-06-12 MED ORDER — POTASSIUM CL IN DEXTROSE 5% 20 MEQ/L IV SOLN
20.0000 meq | INTRAVENOUS | Status: DC
  Administered 2017-06-12 – 2017-06-15 (×3): 20 meq via INTRAVENOUS
  Filled 2017-06-12 (×5): qty 1000

## 2017-06-12 MED ORDER — LEVALBUTEROL HCL 0.63 MG/3ML IN NEBU
0.6300 mg | INHALATION_SOLUTION | Freq: Three times a day (TID) | RESPIRATORY_TRACT | Status: DC
  Administered 2017-06-12 – 2017-06-19 (×21): 0.63 mg via RESPIRATORY_TRACT
  Filled 2017-06-12 (×21): qty 3

## 2017-06-12 NOTE — Evaluation (Signed)
SLP Cancellation Note  Patient Details Name: Douglas Mckee MRN: [id number] DOB: 1923/01/09   Cancelled treatment:       Reason Eval/Treat Not Completed: Fatigue/lethargy limiting ability to participate(pt not adequately alert for evaluation, rn reports not responding to tactile stimuli, asked RN to page this SlP when/if pt awakens adequately)   Macario Golds 06/12/2017, 8:58 AM  Luanna Salk, [place] [facility] SLP [phone]

## 2017-06-12 NOTE — Progress Notes (Signed)
Pt. Transferred to room 1325 report given to RN  On floor

## 2017-06-12 NOTE — Progress Notes (Signed)
Patient ID: Douglas Mckee, male   DOB: 1922-12-25, 84 y.o.   MRN: [id number]  PROGRESS NOTE    Douglas Mckee  VQM:[id number] DOB: 08-06-1922 DOA: 06/04/2017 PCP: Verline Lema, MD   Brief Narrative:  84 year old male with history of BPH, hypertension, gout, previous TIA, dementia presented to the ER with several days of progressively worsening dyspnea along with vomiting.  He was found to be febrile, tachycardic with elevated lactic acid, leukocytosis chest x-ray showed bilateral airspace disease.  He was started on intravenous antibiotics.  Critical care was consulted.   Assessment & Plan:   Principal Problem:   Sepsis due to pneumonia [facility]) Active Problems:   Metabolic encephalopathy   BPH (benign prostatic hyperplasia)   Alzheimer's dementia   Demand ischemia (HCC)   Transaminitis   Acute respiratory failure with hypoxemia (HCC)   Pneumonia of both lungs due to infectious organism   Goals of care, counseling/discussion   Palliative care encounter   SOB (shortness of breath)   Pressure injury of skin   Essential hypertension    1. Sepsis ? Resolved.  Hemodynamically stable.  Off Zosyn since yesterday.  Cultures negative so far 2. Acute resp failure with hypoxia ? Initially required BiPAP.  Currently on nasal cannula.  Wean off oxygen as able. Patient with slow improvement. PCCM has signed off. ? Family desires to continue aggressive medical care, however no aggressive measures such as intubation.  Patient continues to be DNR. ? Decrease Solu-Medrol to 40 mg IV every 12 hours.  Steroid was empirically started to decrease  Inflammation ? Continue Lasix.  Chest x-ray done today shows congestion with effusions  3. Bilateral pneumonia ? ? Aspiration.  Patient is not awake enough for SLP evaluation.  Currently getting NG tube feeding ? Treated with intravenous Zosyn.  Off antibiotics since yesterday ? Respiratory virus panel negative ? Blood cultures negative so far. 4. Transaminitis  with Cholelithiasis ? LFTs improved.  Doubt that the patient would be a surgical candidate 5. Metabolic encephalopathy ? Mental status not improving that much.  Family thinks patient is getting better. 6.  Hypertension ? Blood pressure intermittently still high.  Continue IV Lasix and PRN IV antihypertensives 7. Hyperglycemia ? Probably reactive from steroid use.  Monitor.  Continue Lantus. 8.  Generalized deconditioning      -Overall prognosis is guarded to poor.  Palliative care team following peripherally.  If condition worsens, might benefit from hospice/comfort measures. 9.  Leukocytosis: Worsening.  Probably second to steroid use.  Repeat a.m. Labs 10.  Hypernatremia: Change normal saline to D5.  Repeat a.m. labs  DVT prophylaxis: Lovenox Code Status: DNR Family Communication: Discussed with daughter and grandson at bedside Disposition Plan: Depends on clinical outcome  Consultants: PCCM Palliative care  Procedures: None  Antimicrobials: Zosyn from 06/04/2017-06/11/2017  Subjective: Patient seen and examined at bedside.  He is sleepy, wakes up slightly on calling his name but does not answer much questions.  Spoke to daughter and grandson at bedside.  No overnight fever or vomiting  Objective: Vitals:   06/12/17 0407 06/12/17 0500 06/12/17 0515 06/12/17 0956  BP: (!) 177/61 (!) 158/59  (!) 159/80  Pulse:  93 92 91  Resp:  20 19   Temp:    98 F (36.7 C)  TempSrc:    Axillary  SpO2:  96% 100% 92%  Weight:      Height:        Intake/Output Summary (Last 24 hours) at 06/12/2017 1213 Last data filed  at 06/12/2017 0955 Gross per 24 hour  Intake 3208.16 ml  Output 4075 ml  Net -866.84 ml   Filed Weights   06/04/17 1054 06/11/17 1500  Weight: 75.8 kg (167 lb) 72.8 kg (160 lb 7.9 oz)    Examination:  General exam: Elderly male lying in bed; no distress.  Wakes up but does not answer questions.  NG tube present respiratory system: Bilateral decreased breath sound at  bases with scattered crackles Cardiovascular system: S1 & S2 heard, rate controlled.  Gastrointestinal system: Abdomen is nondistended, soft and nontender. Normal bowel sounds heard. Extremities: No cyanosis, clubbing, edema   Data Reviewed: I have personally reviewed following labs and imaging studies  CBC: Recent Labs  Lab 06/08/17 0334 06/09/17 0720 06/10/17 1820 06/11/17 1048 06/12/17 0451  WBC 20.0* 17.8* 16.9* 19.3* 28.5*  HGB 12.1* 13.1 12.4* 12.2* 12.8*  HCT 38.6* 38.6* 37.3* 37.2* 38.4*  MCV 72.6* 70.1* 70.1* 70.2* 69.9*  PLT 240 215 228 242 542   Basic Metabolic Panel: Recent Labs  Lab 06/06/17 0330  06/08/17 0334 06/09/17 0720 06/10/17 1820 06/11/17 1048 06/12/17 0451  NA 139   < > 139 143 148* 149* 151*  K 4.1   < > 4.1 3.8 3.4* 3.2* 3.1*  CL 102   < > 100* 107 113* 117* 113*  CO2 30   < > 30 26 29 28 30   GLUCOSE 158*   < > 156* 161* 137* 223* 192*  BUN 30*   < > 25* 28* 32* 38* 37*  CREATININE 1.41*   < > 0.97 0.80 0.88 0.84 0.90  CALCIUM 8.6*   < > 8.9 8.7* 8.3* 8.3* 8.4*  MG  --   --   --   --  2.1 2.0 2.1  PHOS 3.4  --   --   --  2.1* 2.2*  --    < > = values in this interval not displayed.   GFR: Estimated Creatinine Clearance: 45.6 mL/min (by C-G formula based on SCr of 0.9 mg/dL). Liver Function Tests: Recent Labs  Lab 06/06/17 0330 06/08/17 0334 06/12/17 0451  AST  --  34 26  ALT  --  69* 35  ALKPHOS  --  106 87  BILITOT  --  1.3* 1.1  PROT  --  6.2* 5.4*  ALBUMIN 2.6* 2.7* 2.4*   No results for input(s): LIPASE, AMYLASE in the last 168 hours. No results for input(s): AMMONIA in the last 168 hours. Coagulation Profile: No results for input(s): INR, PROTIME in the last 168 hours. Cardiac Enzymes: Recent Labs  Lab 06/10/17 1820  TROPONINI 0.03*   BNP (last 3 results) No results for input(s): PROBNP in the last 8760 hours. HbA1C: No results for input(s): HGBA1C in the last 72 hours. CBG: Recent Labs  Lab 06/11/17 1145  06/11/17 1531 06/11/17 2120 06/12/17 0733 06/12/17 1157  GLUCAP 194* 177* 163* 143* 172*   Lipid Profile: No results for input(s): CHOL, HDL, LDLCALC, TRIG, CHOLHDL, LDLDIRECT in the last 72 hours. Thyroid Function Tests: No results for input(s): TSH, T4TOTAL, FREET4, T3FREE, THYROIDAB in the last 72 hours. Anemia Panel: No results for input(s): VITAMINB12, FOLATE, FERRITIN, TIBC, IRON, RETICCTPCT in the last 72 hours. Sepsis Labs: No results for input(s): PROCALCITON, LATICACIDVEN in the last 168 hours.  Recent Results (from the past 240 hour(s))  Culture, blood (Routine x 2)     Status: None   Collection Time: 06/04/17  9:52 AM  Result Value Ref Range Status  Specimen Description BLOOD RIGHT ANTECUBITAL  Final   Special Requests   Final    BOTTLES DRAWN AEROBIC AND ANAEROBIC Blood Culture adequate volume   Culture   Final    NO GROWTH 5 DAYS Performed at [facility], [address], [place], [place] [address]    Report Status 06/09/2017 FINAL  Final  Culture, blood (Routine x 2)     Status: None   Collection Time: 06/04/17  9:57 AM  Result Value Ref Range Status   Specimen Description BLOOD LEFT ANTECUBITAL  Final   Special Requests   Final    BOTTLES DRAWN AEROBIC AND ANAEROBIC Blood Culture adequate volume   Culture   Final    NO GROWTH 5 DAYS Performed at [facility], [place] [address] [place], [place] [address]    Report Status 06/09/2017 FINAL  Final  Urine culture     Status: None   Collection Time: 06/04/17 10:05 AM  Result Value Ref Range Status   Specimen Description URINE, RANDOM  Final   Special Requests NONE  Final   Culture   Final    NO GROWTH Performed at [facility], [place] [address] [place], [place] [address]    Report Status 06/05/2017 FINAL  Final  Culture, blood (x 2)     Status: None   Collection Time: 06/05/17  2:02 AM  Result Value Ref Range Status   Specimen Description BLOOD RIGHT HAND  Final   Special Requests IN  PEDIATRIC BOTTLE Blood Culture adequate volume  Final   Culture   Final    NO GROWTH 5 DAYS Performed at [facility], [place] [address], [place], [place] [address]    Report Status 06/10/2017 FINAL  Final  Culture, blood (x 2)     Status: None   Collection Time: 06/05/17  5:28 AM  Result Value Ref Range Status   Specimen Description BLOOD LEFT HAND  Final   Special Requests IN PEDIATRIC BOTTLE Blood Culture adequate volume  Final   Culture   Final    NO GROWTH 5 DAYS Performed at [facility], [place] [address] [place], [place] [address]    Report Status 06/10/2017 FINAL  Final  MRSA PCR Screening     Status: None   Collection Time: 06/05/17  8:06 AM  Result Value Ref Range Status   MRSA by PCR NEGATIVE NEGATIVE Final    Comment:        The GeneXpert MRSA Assay (FDA approved for NASAL specimens only), is one component of a comprehensive MRSA colonization surveillance program. It is not intended to diagnose MRSA infection nor to guide or monitor treatment for MRSA infections.   Respiratory Panel by PCR     Status: None   Collection Time: 06/05/17  4:15 PM  Result Value Ref Range Status   Adenovirus NOT DETECTED NOT DETECTED Final   Coronavirus 229E NOT DETECTED NOT DETECTED Final   Coronavirus HKU1 NOT DETECTED NOT DETECTED Final   Coronavirus NL63 NOT DETECTED NOT DETECTED Final   Coronavirus OC43 NOT DETECTED NOT DETECTED Final   Metapneumovirus NOT DETECTED NOT DETECTED Final   Rhinovirus / Enterovirus NOT DETECTED NOT DETECTED Final   Influenza A NOT DETECTED NOT DETECTED Final   Influenza B NOT DETECTED NOT DETECTED Final   Parainfluenza Virus 1 NOT DETECTED NOT DETECTED Final   Parainfluenza Virus 2 NOT DETECTED NOT DETECTED Final   Parainfluenza Virus 3 NOT DETECTED NOT DETECTED  Final   Parainfluenza Virus 4 NOT DETECTED NOT DETECTED Final   Respiratory Syncytial Virus NOT DETECTED NOT DETECTED Final   Bordetella pertussis NOT DETECTED NOT DETECTED Final    Chlamydophila pneumoniae NOT DETECTED NOT DETECTED Final   Mycoplasma pneumoniae NOT DETECTED NOT DETECTED Final         Radiology Studies: Dg Chest Port 1 View  Result Date: 06/12/2017 CLINICAL DATA:  Dyspnea. EXAM: PORTABLE CHEST 1 VIEW COMPARISON:  Radiograph of June 10, 2017. FINDINGS: Stable cardiomediastinal silhouette. Atherosclerosis of thoracic aorta is noted. Feeding tube is unchanged in position. Right-sided PICC line is unchanged. No pneumothorax is noted. Stable bilateral lung opacities are noted concerning for edema or pneumonia, with stable bibasilar atelectasis and effusions. Bony thorax is unremarkable. IMPRESSION: Aortic atherosclerosis. Stable support apparatus. Stable bilateral lung opacities concerning for edema or pneumonia with probable bibasilar atelectasis and effusions. Electronically Signed   By: Marijo Conception, M.D.   On: 06/12/2017 07:16   Dg Chest Port 1 View  Result Date: 06/10/2017 CLINICAL DATA:  Feeding tube placement. EXAM: CHEST 1 VIEW COMPARISON:  06/05/2017 FINDINGS: The feeding tube tip is in the body region of the stomach. Right PICC line tip is in the distal SVC. Persistent diffuse interstitial process and pleural effusions which could represent edema or infiltrates. The left upper lobe consolidation has significantly improved. IMPRESSION: The feeding tube tip is in the body region of the stomach. Right PICC line tip is in the distal SVC. Improving/resolving left upper lobe pneumonia. Persistent diffuse interstitial process, effusions and basilar atelectasis. Electronically Signed   By: Marijo Sanes M.D.   On: 06/10/2017 19:03   Dg Abd Portable 1v  Result Date: 06/10/2017 CLINICAL DATA:  Feeding tube placement. EXAM: ABDOMEN - 1 VIEW COMPARISON:  None. FINDINGS: The feeding tube tip is in the body region of the stomach. The bowel gas pattern is unremarkable. The bony structures are intact. IMPRESSION: Feeding tube tip in the body region of the  stomach. Electronically Signed   By: Marijo Sanes M.D.   On: 06/10/2017 19:03        Scheduled Meds: . chlorhexidine  15 mL Mouth Rinse BID  . Chlorhexidine Gluconate Cloth  6 each Topical Daily  . enoxaparin (LOVENOX) injection  40 mg Subcutaneous Q24H  . feeding supplement (OSMOLITE 1.2 CAL)  1,000 mL Per Tube Q24H  . free water  50 mL Per Tube Q4H  . furosemide  40 mg Intravenous Q12H  . insulin glargine  10 Units Subcutaneous Daily  . levalbuterol  0.63 mg Nebulization TID  . mouth rinse  15 mL Mouth Rinse q12n4p  . methylPREDNISolone (SOLU-MEDROL) injection  40 mg Intravenous Q12H  . metoprolol tartrate  5 mg Intravenous Q6H  . scopolamine  1 patch Transdermal Q72H  . sodium chloride flush  10-40 mL Intracatheter Q12H   Continuous Infusions: . dextrose 5 % with KCl 20 mEq / L 20 mEq (06/12/17 0842)  . famotidine (PEPCID) IV Stopped (06/12/17 1025)  . potassium chloride 10 mEq (06/12/17 1154)  . sodium chloride       LOS: 8 days        Aline August, MD [facility] Pager [phone]  If 7PM-7AM, please contact night-coverage [email or web address] Password [facility] 06/12/2017, 12:13 PM

## 2017-06-12 NOTE — Progress Notes (Signed)
Nutrition Follow-up  INTERVENTION:   Monitor magnesium, potassium, and phosphorus daily for at least 3 days, MD to replete as needed, as pt is at risk for refeeding syndrome given no nutrition x7 days, unsure of nutrition status PTA, current hypokalemia and hypophosphatemia.  -Continue Osmolite 1.2 @ 20 mL/hr to increase by 10 mL every 12 hours to reach goal rate of Osmolite 1.2 @ 55 mL/hr. At goal rate, this regimen will provide 1584 kcal, 73 grams of protein, and 1082 mL free water. -Continue 50 mL free water every 4 hours (300 mL/day).   NUTRITION DIAGNOSIS:   Inadequate oral intake related to inability to eat as evidenced by NPO status.  Ongoing.  GOAL:   Patient will meet greater than or equal to 90% of their needs  Progressing.  MONITOR:   TF tolerance, Weight trends, Labs, Skin, GOC  REASON FOR ASSESSMENT:   Consult Enteral/tube feeding initiation and management  ASSESSMENT:   84 year old male with history significant for BPH, hypertension, gout, previous TIA, dementia presented to the ER with several days of progressively worsening dyspnea and acutely worse after episodes of vomiting.  In the ER patient was febrile to 101.9, tachycardia, respiratory distress.  Elevated lactic acid level of 8.6, transaminitis, leukocytosis and elevated troponin level.  Chest x-ray with bilateral airspace disease.   Pt currently receiving Osmolite 1.2 @ 20 ml/hr. Will continue to increase towards goal slowly given refeeding risk. Will continue to monitor progress and GOC. SLP was unable to assess patient d/t lethargy.  Per weight records, pt has lost 7 lb since 2017. Insignificant changes.   Medications: Free water 50 ml every 4 hours, IV Lasix every 12 hours, D5 w/ KCl infusion at 50 ml/hr -provides 204 kcal Labs reviewed: CBGs: 143-163 Elevated Na Low K  Diet Order:  Diet NPO time specified  EDUCATION NEEDS:   No education needs have been identified at this time  Skin:  Skin  Assessment: Skin Integrity Issues: Skin Integrity Issues:: Stage I Stage I: coccyx  Last BM:  1/15  Height:   Ht Readings from Last 1 Encounters:  06/04/17 5\' 5"  (1.651 m)    Weight:   Wt Readings from Last 1 Encounters:  06/11/17 160 lb 7.9 oz (72.8 kg)    Ideal Body Weight:  61.82 kg  BMI:  Body mass index is 26.71 kg/m.  Estimated Nutritional Needs:   Kcal:  1515-1745 (20-23 kcal/kg)  Protein:  60-70 grams  Fluid:  >/= 1.6 L/day  Clayton Bibles, MS, RD, LDN [facility] Pager: [phone] After Hours Pager: [phone]

## 2017-06-12 NOTE — Care Management Important Message (Signed)
Important Message  Patient Details  Name: Kutler Vanvranken MRN: [id number] Date of Birth: February 08, 1923   Medicare Important Message Given:  Yes    Kerin Salen 06/12/2017, 12:16 [place] Message  Patient Details  Name: Uriah Philipson MRN: [id number] Date of Birth: [birth year]   Medicare Important Message Given:  Yes    Kerin Salen 06/12/2017, 12:16 PM

## 2017-06-13 ENCOUNTER — Inpatient Hospital Stay (HOSPITAL_COMMUNITY): Payer: Medicare Other

## 2017-06-13 DIAGNOSIS — Z515 Encounter for palliative care: Secondary | ICD-10-CM

## 2017-06-13 DIAGNOSIS — Z7189 Other specified counseling: Secondary | ICD-10-CM

## 2017-06-13 LAB — BASIC METABOLIC PANEL
ANION GAP: 5 (ref 5–15)
BUN: 37 mg/dL — ABNORMAL HIGH (ref 6–20)
CO2: 34 mmol/L — ABNORMAL HIGH (ref 22–32)
Calcium: 8.3 mg/dL — ABNORMAL LOW (ref 8.9–10.3)
Chloride: 110 mmol/L (ref 101–111)
Creatinine, Ser: 0.86 mg/dL (ref 0.61–1.24)
GLUCOSE: 195 mg/dL — AB (ref 65–99)
POTASSIUM: 3.9 mmol/L (ref 3.5–5.1)
Sodium: 149 mmol/L — ABNORMAL HIGH (ref 135–145)

## 2017-06-13 LAB — CBC
HCT: 35.1 % — ABNORMAL LOW (ref 39.0–52.0)
Hemoglobin: 11.3 g/dL — ABNORMAL LOW (ref 13.0–17.0)
MCH: 23 pg — ABNORMAL LOW (ref 26.0–34.0)
MCHC: 32.2 g/dL (ref 30.0–36.0)
MCV: 71.5 fL — AB (ref 78.0–100.0)
PLATELETS: 206 10*3/uL (ref 150–400)
RBC: 4.91 MIL/uL (ref 4.22–5.81)
RDW: 17.4 % — ABNORMAL HIGH (ref 11.5–15.5)
WBC: 33.9 10*3/uL — AB (ref 4.0–10.5)

## 2017-06-13 LAB — GLUCOSE, CAPILLARY
GLUCOSE-CAPILLARY: 193 mg/dL — AB (ref 65–99)
Glucose-Capillary: 161 mg/dL — ABNORMAL HIGH (ref 65–99)
Glucose-Capillary: 183 mg/dL — ABNORMAL HIGH (ref 65–99)
Glucose-Capillary: 150 mg/dL — ABNORMAL HIGH (ref 65–99)

## 2017-06-13 LAB — MAGNESIUM: MAGNESIUM: 2.1 mg/dL (ref 1.7–2.4)

## 2017-06-13 MED ORDER — FUROSEMIDE 10 MG/ML IJ SOLN
40.0000 mg | Freq: Three times a day (TID) | INTRAMUSCULAR | Status: DC
  Administered 2017-06-13 – 2017-06-15 (×6): 40 mg via INTRAVENOUS
  Filled 2017-06-13 (×6): qty 4

## 2017-06-13 MED ORDER — METHYLPREDNISOLONE SODIUM SUCC 40 MG IJ SOLR
40.0000 mg | Freq: Every day | INTRAMUSCULAR | Status: DC
  Administered 2017-06-14: 40 mg via INTRAVENOUS
  Filled 2017-06-13: qty 1

## 2017-06-13 NOTE — Progress Notes (Signed)
Modified Barium Swallow Progress Note  Patient Details  Name: Douglas Mckee MRN: [id number] Date of Birth: 01/20/1923  Today's Date: 06/13/2017  Modified Barium Swallow completed.  Full report located under Chart Review in the Imaging Section.  Brief recommendations include the following:  Clinical Impression  Patient presents with severe oropharyngeal dysphagia characterized by sensorimotor deficits.  He is grossly weak and retained secretions present in oropharynx that mixed with barium.  Delayed oral transiting noted despite pt's continuous lingual thrusting with and without intake.  Pt did not orally transit tsp amounts of liquids and larger boluses spill into pharynx poorly controlled.   Pharyngeal swallow is weak resulting in residuals throughout pharynx (more vallecular) with suspected sensory deficit.  He does demonstrate intermittent double swallows - but these are weak and do not clear oropharynx.  Pt did aspirate a moderate amount of nectar barium with subsequent delayed weak cough - noted secretions mixed with barium coughed in and out of airway with aspiration.  Grandson and granddaughter Douglas Haw* present during [place] and were educated to findings/concerns.  Pt is very high aspiration risk and doubtful pt's swallow will return to functional level during this hospital coarse- informed grandson of this. SlP advised grandchildren that pt is aspirating secretions and this will not be prevented despite feeding tubes, etc.  Reviewed possible feasible option of allowing intake with known aspiration for comfort if MD agrees.  Frequent oral suctioning conducted during MBS with removal of viscous secretions and barium.      Swallow Evaluation Recommendations       SLP Diet Recommendations: Other (Comment)(? if family desires comfort diet)       Medication Administration: Via alternative means               Oral Care Recommendations: Oral care QID   Other Recommendations: Have oral suction  available   Luanna Salk, [place] [facility] SLP [phone]  Macario Golds 06/13/2017,12:42 PM
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
## 2017-06-13 NOTE — Progress Notes (Signed)
Patient ID: Douglas Mckee, male   DOB: [birth year], 84 y.o.   MRN: [id number]  PROGRESS NOTE    Douglas Mckee  OZH:[id number] DOB: 04-22-1923 DOA: 06/04/2017 PCP: Verline Lema, MD   Brief Narrative:  84 year old male with history of BPH, hypertension, gout, previous TIA, dementia presented to the ER with several days of progressively worsening dyspnea along with vomiting.  He was found to be febrile, tachycardic with elevated lactic acid, leukocytosis chest x-ray showed bilateral airspace disease.  He was started on intravenous antibiotics.  Critical care was consulted.    Assessment & Plan:   Principal Problem:   Sepsis due to pneumonia [facility]) Active Problems:   Metabolic encephalopathy   BPH (benign prostatic hyperplasia)   Alzheimer's dementia   Demand ischemia (HCC)   Transaminitis   Acute respiratory failure with hypoxemia (HCC)   Pneumonia of both lungs due to infectious organism   Goals of care, counseling/discussion   Palliative care encounter   SOB (shortness of breath)   Pressure injury of skin   Essential hypertension    1. Sepsis ? Resolved.  Hemodynamically stable.  Currently off antibiotics.  Cultures negative so far 2. Acute resp failure with hypoxia ? Initially required BiPAP.  Currently on nasal cannula.  Wean off oxygen as able. Patient with slow improvement. PCCM has signed off. ? Family desires to continue aggressive medical care, however no aggressive measures such as intubation.  Patient continues to be DNR. ? Decrease Solu-Medrol to 40 mg IV every 24 hours, as leukocytosis is worsening.  Steroid was empirically started to decrease  Inflammation ? Change Lasix to 40 mg IV every 8 hours.   3. Bilateral pneumonia ? ? Aspiration.  Patient is not awake enough for SLP evaluation.  Currently getting NG tube feeding ? Treated with intravenous Zosyn.  Completed course. ? Respiratory virus panel negative ? Blood cultures negative so far. 4. Transaminitis with  Cholelithiasis ? LFTs improved.  Doubt that the patient would be a surgical candidate 5. Metabolic encephalopathy ? Mental status not improving that much.  . 6.  Hypertension ? Blood pressure improving.  Continue IV Lasix and PRN IV antihypertensives 7. Hyperglycemia ? Probably reactive from steroid use.  Monitor.  Continue Lantus. 8.  Generalized deconditioning      -Overall prognosis is guarded to poor.  Palliative care team following peripherally.  If condition worsens, might benefit from hospice/comfort measures.  Patient's overall condition is not improving, I recommend that patient be made comfort measures/hospice.  Will discuss with family members 7.  Leukocytosis: Worsening.  Probably secondary to steroid use versus occult infection.  Repeat a.m. Labs.  If leukocytosis worsens, then might have to do further imaging studies; futility of the study is unknown.  We will get blood cultures 10.  Hypernatremia: Improving.  Continue current IV fluids.  Repeat a.m. Labs  DVT prophylaxis: Lovenox Code Status: DNR Family Communication: Discussed with daughter and grandson at bedside Disposition Plan: Depends on clinical outcome  Consultants: PCCM Palliative care  Procedures: None  Antimicrobials: Zosyn from 06/04/2017-06/11/2017  Subjective: Patient seen and examined at bedside.  He is sleepy, wakes up on calling his name; does not answer any questions.  Spoke to daughter and grandson at bedside.  No overnight fever or vomiting.  Tolerating NG tube feeding  Objective: Vitals:   06/12/17 1928 06/12/17 2125 06/13/17 0544 06/13/17 0818  BP:  131/69 (!) 137/55   Pulse:  78 77   Resp:  19 15   Temp:  99.1 F (37.3 C) 98.6 F (37 C)   TempSrc:  Axillary Axillary   SpO2: 94% 98% 96% 93%  Weight:   76.1 kg (167 lb 12.3 oz)   Height:        Intake/Output Summary (Last 24 hours) at 06/13/2017 0836 Last data filed at 06/13/2017 0755 Gross per 24 hour  Intake 2667.17 ml  Output 1726 ml    Net 941.17 ml   Filed Weights   06/04/17 1054 06/11/17 1500 06/13/17 0544  Weight: 75.8 kg (167 lb) 72.8 kg (160 lb 7.9 oz) 76.1 kg (167 lb 12.3 oz)    Examination:  General exam: Elderly male lying in bed; no distress.  Does not wake up on calling his name.  NG tube present respiratory system: Bilateral decreased breath sound at bases with scattered crackles Cardiovascular system: S1 & S2 heard, rate controlled.  Gastrointestinal system: Abdomen is nondistended, soft and nontender. Normal bowel sounds heard. Extremities: No cyanosis, clubbing; trace edema  Data Reviewed: I have personally reviewed following labs and imaging studies  CBC: Recent Labs  Lab 06/09/17 0720 06/10/17 1820 06/11/17 1048 06/12/17 0451 06/13/17 0530  WBC 17.8* 16.9* 19.3* 28.5* 33.9*  HGB 13.1 12.4* 12.2* 12.8* 11.3*  HCT 38.6* 37.3* 37.2* 38.4* 35.1*  MCV 70.1* 70.1* 70.2* 69.9* 71.5*  PLT 215 228 242 233 160   Basic Metabolic Panel: Recent Labs  Lab 06/09/17 0720 06/10/17 1820 06/11/17 1048 06/12/17 0451 06/13/17 0530  NA 143 148* 149* 151* 149*  K 3.8 3.4* 3.2* 3.1* 3.9  CL 107 113* 117* 113* 110  CO2 26 29 28 30  34*  GLUCOSE 161* 137* 223* 192* 195*  BUN 28* 32* 38* 37* 37*  CREATININE 0.80 0.88 0.84 0.90 0.86  CALCIUM 8.7* 8.3* 8.3* 8.4* 8.3*  MG  --  2.1 2.0 2.1 2.1  PHOS  --  2.1* 2.2*  --   --    GFR: Estimated Creatinine Clearance: 52.2 mL/min (by C-G formula based on SCr of 0.86 mg/dL). Liver Function Tests: Recent Labs  Lab 06/08/17 0334 06/12/17 0451  AST 34 26  ALT 69* 35  ALKPHOS 106 87  BILITOT 1.3* 1.1  PROT 6.2* 5.4*  ALBUMIN 2.7* 2.4*   No results for input(s): LIPASE, AMYLASE in the last 168 hours. No results for input(s): AMMONIA in the last 168 hours. Coagulation Profile: No results for input(s): INR, PROTIME in the last 168 hours. Cardiac Enzymes: Recent Labs  Lab 06/10/17 1820  TROPONINI 0.03*   BNP (last 3 results) No results for input(s):  PROBNP in the last 8760 hours. HbA1C: No results for input(s): HGBA1C in the last 72 hours. CBG: Recent Labs  Lab 06/11/17 2120 06/12/17 0733 06/12/17 1157 06/12/17 1756 06/12/17 2123  GLUCAP 163* 143* 172* 182* 164*   Lipid Profile: No results for input(s): CHOL, HDL, LDLCALC, TRIG, CHOLHDL, LDLDIRECT in the last 72 hours. Thyroid Function Tests: No results for input(s): TSH, T4TOTAL, FREET4, T3FREE, THYROIDAB in the last 72 hours. Anemia Panel: No results for input(s): VITAMINB12, FOLATE, FERRITIN, TIBC, IRON, RETICCTPCT in the last 72 hours. Sepsis Labs: No results for input(s): PROCALCITON, LATICACIDVEN in the last 168 hours.  Recent Results (from the past 240 hour(s))  Culture, blood (Routine x 2)     Status: None   Collection Time: 06/04/17  9:52 AM  Result Value Ref Range Status   Specimen Description BLOOD RIGHT ANTECUBITAL  Final   Special Requests   Final    BOTTLES DRAWN AEROBIC AND  ANAEROBIC Blood Culture adequate volume   Culture   Final    NO GROWTH 5 DAYS Performed at [facility], [place] [address], [place], [place] [address]    Report Status 06/09/2017 FINAL  Final  Culture, blood (Routine x 2)     Status: None   Collection Time: 06/04/17  9:57 AM  Result Value Ref Range Status   Specimen Description BLOOD LEFT ANTECUBITAL  Final   Special Requests   Final    BOTTLES DRAWN AEROBIC AND ANAEROBIC Blood Culture adequate volume   Culture   Final    NO GROWTH 5 DAYS Performed at [facility], [place] [address], [place], [place] [address]    Report Status 06/09/2017 FINAL  Final  Urine culture     Status: None   Collection Time: 06/04/17 10:05 AM  Result Value Ref Range Status   Specimen Description URINE, RANDOM  Final   Special Requests NONE  Final   Culture   Final    NO GROWTH Performed at [facility], [place] [address], [place], [place] [address]    Report Status 06/05/2017 FINAL  Final  Culture, blood (x 2)     Status: None    Collection Time: 06/05/17  2:02 AM  Result Value Ref Range Status   Specimen Description BLOOD RIGHT HAND  Final   Special Requests IN PEDIATRIC BOTTLE Blood Culture adequate volume  Final   Culture   Final    NO GROWTH 5 DAYS Performed at [facility], [place] [address], [place], [place] [address]    Report Status 06/10/2017 FINAL  Final  Culture, blood (x 2)     Status: None   Collection Time: 06/05/17  5:28 AM  Result Value Ref Range Status   Specimen Description BLOOD LEFT HAND  Final   Special Requests IN PEDIATRIC BOTTLE Blood Culture adequate volume  Final   Culture   Final    NO GROWTH 5 DAYS Performed at [facility], [place] [address] [place], [place] [address]    Report Status 06/10/2017 FINAL  Final  MRSA PCR Screening     Status: None   Collection Time: 06/05/17  8:06 AM  Result Value Ref Range Status   MRSA by PCR NEGATIVE NEGATIVE Final    Comment:        The GeneXpert MRSA Assay (FDA approved for NASAL specimens only), is one component of a comprehensive MRSA colonization surveillance program. It is not intended to diagnose MRSA infection nor to guide or monitor treatment for MRSA infections.   Respiratory Panel by PCR     Status: None   Collection Time: 06/05/17  4:15 PM  Result Value Ref Range Status   Adenovirus NOT DETECTED NOT DETECTED Final   Coronavirus 229E NOT DETECTED NOT DETECTED Final   Coronavirus HKU1 NOT DETECTED NOT DETECTED Final   Coronavirus NL63 NOT DETECTED NOT DETECTED Final   Coronavirus OC43 NOT DETECTED NOT DETECTED Final   Metapneumovirus NOT DETECTED NOT DETECTED Final   Rhinovirus / Enterovirus NOT DETECTED NOT DETECTED Final   Influenza A NOT DETECTED NOT DETECTED Final   Influenza B NOT DETECTED NOT DETECTED Final   Parainfluenza Virus 1 NOT DETECTED NOT DETECTED Final   Parainfluenza Virus 2 NOT DETECTED NOT DETECTED Final   Parainfluenza Virus 3 NOT DETECTED NOT DETECTED Final   Parainfluenza Virus 4 NOT DETECTED  NOT DETECTED Final   Respiratory Syncytial Virus NOT DETECTED NOT DETECTED  Final   Bordetella pertussis NOT DETECTED NOT DETECTED Final   Chlamydophila pneumoniae NOT DETECTED NOT DETECTED Final   Mycoplasma pneumoniae NOT DETECTED NOT DETECTED Final         Radiology Studies: Dg Chest Port 1 View  Result Date: 06/12/2017 CLINICAL DATA:  Dyspnea. EXAM: PORTABLE CHEST 1 VIEW COMPARISON:  Radiograph of June 10, 2017. FINDINGS: Stable cardiomediastinal silhouette. Atherosclerosis of thoracic aorta is noted. Feeding tube is unchanged in position. Right-sided PICC line is unchanged. No pneumothorax is noted. Stable bilateral lung opacities are noted concerning for edema or pneumonia, with stable bibasilar atelectasis and effusions. Bony thorax is unremarkable. IMPRESSION: Aortic atherosclerosis. Stable support apparatus. Stable bilateral lung opacities concerning for edema or pneumonia with probable bibasilar atelectasis and effusions. Electronically Signed   By: Marijo Conception, M.D.   On: 06/12/2017 07:16        Scheduled Meds: . chlorhexidine  15 mL Mouth Rinse BID  . Chlorhexidine Gluconate Cloth  6 each Topical Daily  . enoxaparin (LOVENOX) injection  40 mg Subcutaneous Q24H  . feeding supplement (OSMOLITE 1.2 CAL)  1,000 mL Per Tube Q24H  . free water  50 mL Per Tube Q4H  . furosemide  40 mg Intravenous Q12H  . insulin glargine  10 Units Subcutaneous Daily  . levalbuterol  0.63 mg Nebulization TID  . mouth rinse  15 mL Mouth Rinse q12n4p  . methylPREDNISolone (SOLU-MEDROL) injection  40 mg Intravenous Q12H  . metoprolol tartrate  5 mg Intravenous Q6H  . scopolamine  1 patch Transdermal Q72H  . sodium chloride flush  10-40 mL Intracatheter Q12H   Continuous Infusions: . dextrose 5 % with KCl 20 mEq / L 20 mEq (06/12/17 1725)  . famotidine (PEPCID) IV Stopped (06/12/17 2240)  . sodium chloride       LOS: 9 days        Aline August, MD [facility] Pager  [phone]  If 7PM-7AM, please contact night-coverage [email or web address] Password [facility] 06/13/2017, 8:36 AM

## 2017-06-13 NOTE — Progress Notes (Signed)
PMT progress note  Reason for consult: Goals of care in light of poor prognosis related to continued encephalopathy following sepsis from bilateral pneumonia and concern for continued aspiration  Patient seen, met with wife, daughter, grandson and granddaughter at bedside.    At present, patient is resting in bed, with eyes closed.  He does not respond to gentle verbal or tactile stimulation.  Family reports he "has been up for a while and just went back to sleep a few minutes before you got here."  I reviewed with family their understanding of results from [place] testing from earlier today.  His grandson is able to verbalize that he has been told that he is continually going to aspirate moving forward.  BP (!) 144/59 (BP Location: Left Arm)   Pulse 86   Temp 97.8 F (36.6 C) (Axillary)   Resp 16   Ht '5\' 5"'  (1.651 m)   Wt 76.1 kg (167 lb 12.3 oz)   SpO2 100%   BMI 27.92 kg/m  Labs and imaging noted, chart reviewed.   Elderly gentleman,somnolent but appears comfortable  Regular Rales bilaterally  Trace edema Does not open eyes, does not follow commands  Sepsis PNA, possibly aspiration Acute hypoxic resp failure Transaminitis with cholelithiasis.  Chart review notes history of Alzheimer's.   We discussed his clinical course over the past week including results of MBS from today my concern that he is at high risk for continued aspiration which is not a "fixable" problem. Family reports understanding with a been told regarding this but remain hopeful that he will improve. As this is my first time interacting with family, great deal of time was spent trying to build rapport with them and understand their concerns and understanding of his current condition. I will plan to follow-up again with them tomorrow to continue to progress conversation as they are emotionally able.  Total time: 30 minutes Greater than 50%  of this time was spent counseling and coordinating care related to the  above assessment and plan.  Micheline Rough, MD [place] Team [phone]

## 2017-06-13 NOTE — Progress Notes (Signed)
Nutrition Follow-up  INTERVENTION:   Monitor magnesium, potassium, and phosphorus daily for at least 3 days, MD to replete as needed, as pt is at risk for refeeding syndrome givenno nutrition x7 days, unsure of nutrition status PTA, current hypokalemia and hypophosphatemia.  -Continue Osmolite 1.2 @ 50 mL/hr to increase by 5 mL every 12 hours to reach goal rate of Osmolite 1.2 @ 55 mL/hr. At goal rate, this regimen will provide 1584 kcal, 73 grams of protein, and 1082 mL free water. -Continue 50 mL free water every 4 hours (300 mL/day).  NUTRITION DIAGNOSIS:   Inadequate oral intake related to inability to eat as evidenced by NPO status.  Ongoing.  GOAL:   Patient will meet greater than or equal to 90% of their needs  Progressing.  MONITOR:   TF tolerance, Weight trends, Labs, Skin  ASSESSMENT:   84 year old male with history significant for BPH, hypertension, gout, previous TIA, dementia presented to the ER with several days of progressively worsening dyspnea and acutely worse after episodes of vomiting.  In the ER patient was febrile to 101.9, tachycardia, respiratory distress.  Elevated lactic acid level of 8.6, transaminitis, leukocytosis and elevated troponin level.  Chest x-ray with bilateral airspace disease.   Pt continues to tolerate TF, currently running at 50 ml/hr via NGT. SLP evaluated pt today, recommends MBS to assess aspiration risk.   Medications: IV Lasix every 8 hours, D5 w/ KCl infusion at 50 ml/hr -provides 204 kcal  Labs reviewed: CBGs: 164-182 Elevated Na  Diet Order:  Diet NPO time specified  EDUCATION NEEDS:   No education needs have been identified at this time  Skin:  Skin Assessment: Skin Integrity Issues: Skin Integrity Issues:: Stage I Stage I: coccyx  Last BM:  1/15  Height:   Ht Readings from Last 1 Encounters:  06/04/17 5\' 5"  (1.651 m)    Weight:   Wt Readings from Last 1 Encounters:  06/13/17 167 lb 12.3 oz (76.1 kg)     Ideal Body Weight:  61.82 kg  BMI:  Body mass index is 27.92 kg/m.  Estimated Nutritional Needs:   Kcal:  1515-1745 (20-23 kcal/kg)  Protein:  60-70 grams  Fluid:  >/= 1.6 L/day  Clayton Bibles, MS, RD, LDN [place] [facility] Pager: [phone] After Hours Pager: [phone]

## 2017-06-14 ENCOUNTER — Encounter (HOSPITAL_COMMUNITY): Payer: Self-pay | Admitting: Radiology

## 2017-06-14 ENCOUNTER — Inpatient Hospital Stay (HOSPITAL_COMMUNITY): Payer: Medicare Other

## 2017-06-14 DIAGNOSIS — D72829 Elevated white blood cell count, unspecified: Secondary | ICD-10-CM

## 2017-06-14 DIAGNOSIS — R945 Abnormal results of liver function studies: Secondary | ICD-10-CM

## 2017-06-14 LAB — CBC
HCT: 35.3 % — ABNORMAL LOW (ref 39.0–52.0)
Hemoglobin: 11.4 g/dL — ABNORMAL LOW (ref 13.0–17.0)
MCH: 22.9 pg — AB (ref 26.0–34.0)
MCHC: 32.3 g/dL (ref 30.0–36.0)
MCV: 71 fL — ABNORMAL LOW (ref 78.0–100.0)
PLATELETS: 216 10*3/uL (ref 150–400)
RBC: 4.97 MIL/uL (ref 4.22–5.81)
RDW: 17.2 % — AB (ref 11.5–15.5)
WBC: 36.1 10*3/uL — ABNORMAL HIGH (ref 4.0–10.5)

## 2017-06-14 LAB — COMPREHENSIVE METABOLIC PANEL
ALT: 43 U/L (ref 17–63)
AST: 48 U/L — ABNORMAL HIGH (ref 15–41)
Albumin: 2.2 g/dL — ABNORMAL LOW (ref 3.5–5.0)
Alkaline Phosphatase: 90 U/L (ref 38–126)
Anion gap: 6 (ref 5–15)
BILIRUBIN TOTAL: 0.7 mg/dL (ref 0.3–1.2)
BUN: 34 mg/dL — ABNORMAL HIGH (ref 6–20)
CHLORIDE: 104 mmol/L (ref 101–111)
CO2: 37 mmol/L — ABNORMAL HIGH (ref 22–32)
CREATININE: 0.84 mg/dL (ref 0.61–1.24)
Calcium: 8.1 mg/dL — ABNORMAL LOW (ref 8.9–10.3)
Glucose, Bld: 153 mg/dL — ABNORMAL HIGH (ref 65–99)
POTASSIUM: 4.1 mmol/L (ref 3.5–5.1)
Sodium: 147 mmol/L — ABNORMAL HIGH (ref 135–145)
TOTAL PROTEIN: 4.9 g/dL — AB (ref 6.5–8.1)

## 2017-06-14 LAB — BLOOD CULTURE ID PANEL (REFLEXED)
Acinetobacter baumannii: NOT DETECTED
CANDIDA PARAPSILOSIS: NOT DETECTED
CARBAPENEM RESISTANCE: NOT DETECTED
Candida albicans: NOT DETECTED
Candida glabrata: NOT DETECTED
Candida krusei: NOT DETECTED
Candida tropicalis: NOT DETECTED
ENTEROBACTER CLOACAE COMPLEX: NOT DETECTED
ENTEROBACTERIACEAE SPECIES: NOT DETECTED
Enterococcus species: NOT DETECTED
Escherichia coli: NOT DETECTED
Haemophilus influenzae: NOT DETECTED
KLEBSIELLA PNEUMONIAE: NOT DETECTED
Klebsiella oxytoca: NOT DETECTED
Listeria monocytogenes: NOT DETECTED
METHICILLIN RESISTANCE: NOT DETECTED
NEISSERIA MENINGITIDIS: NOT DETECTED
PSEUDOMONAS AERUGINOSA: NOT DETECTED
Proteus species: NOT DETECTED
STAPHYLOCOCCUS AUREUS BCID: NOT DETECTED
STREPTOCOCCUS PNEUMONIAE: NOT DETECTED
Serratia marcescens: NOT DETECTED
Staphylococcus species: DETECTED — AB
Streptococcus agalactiae: NOT DETECTED
Streptococcus pyogenes: NOT DETECTED
Streptococcus species: NOT DETECTED

## 2017-06-14 LAB — GLUCOSE, CAPILLARY
GLUCOSE-CAPILLARY: 205 mg/dL — AB (ref 65–99)
Glucose-Capillary: 136 mg/dL — ABNORMAL HIGH (ref 65–99)
Glucose-Capillary: 145 mg/dL — ABNORMAL HIGH (ref 65–99)
Glucose-Capillary: 148 mg/dL — ABNORMAL HIGH (ref 65–99)
Glucose-Capillary: 153 mg/dL — ABNORMAL HIGH (ref 65–99)
Glucose-Capillary: 178 mg/dL — ABNORMAL HIGH (ref 65–99)

## 2017-06-14 LAB — MAGNESIUM: MAGNESIUM: 2 mg/dL (ref 1.7–2.4)

## 2017-06-14 MED ORDER — VANCOMYCIN HCL 10 G IV SOLR
1500.0000 mg | INTRAVENOUS | Status: AC
  Administered 2017-06-14: 1500 mg via INTRAVENOUS
  Filled 2017-06-14 (×2): qty 1500

## 2017-06-14 MED ORDER — IOPAMIDOL (ISOVUE-300) INJECTION 61%
INTRAVENOUS | Status: AC
  Filled 2017-06-14: qty 100

## 2017-06-14 MED ORDER — IOPAMIDOL (ISOVUE-300) INJECTION 61%
100.0000 mL | Freq: Once | INTRAVENOUS | Status: AC | PRN
  Administered 2017-06-14: 100 mL via INTRAVENOUS

## 2017-06-14 MED ORDER — VANCOMYCIN HCL IN DEXTROSE 1-5 GM/200ML-% IV SOLN
1000.0000 mg | INTRAVENOUS | Status: DC
  Administered 2017-06-15 – 2017-06-16 (×2): 1000 mg via INTRAVENOUS
  Filled 2017-06-14 (×3): qty 200

## 2017-06-14 MED ORDER — IOPAMIDOL (ISOVUE-300) INJECTION 61%
INTRAVENOUS | Status: AC
  Filled 2017-06-14: qty 30

## 2017-06-14 MED ORDER — PIPERACILLIN-TAZOBACTAM 3.375 G IVPB
3.3750 g | Freq: Three times a day (TID) | INTRAVENOUS | Status: AC
  Administered 2017-06-14 – 2017-06-20 (×20): 3.375 g via INTRAVENOUS
  Filled 2017-06-14 (×24): qty 50

## 2017-06-14 NOTE — Progress Notes (Signed)
PMT progress note  Reason for consult: Goals of care in light of poor prognosis related to continued encephalopathy following sepsis from bilateral pneumonia and concern for continued aspiration  Chart reviewed and case discussed with Dr. Starla Link and bedside RN.  Patient seen and examined.  Daughters and grandson at bedside.   At present, patient is resting in bed, with eyes closed.  He does not respond to gentle verbal or tactile stimulation.  I tried to engage family to discuss clinical course over the last 24 hours and no one would engage in conversation.    Grandson finally reports, "We don't need you to meet with [place] anymore.  We have your card and will call if we want to talk with you any further."   BP (!) 131/59 (BP Location: Left Arm)   Pulse 100   Temp 99.8 F (37.7 C) (Axillary)   Resp 16   Ht 5\' 5"  (1.651 m)   Wt 76.1 kg (167 lb 12.3 oz)   SpO2 95%   BMI 27.92 kg/m  Labs and imaging noted, chart reviewed.   Elderly gentleman,somnolent but appears comfortable  Regular Rales bilaterally  Trace edema Does not open eyes, does not follow commands  Sepsis PNA, aspiration Acute hypoxic resp failure Transaminitis with cholelithiasis.  Chart review notes history of Alzheimer's.   Family remains hopeful that he will clinically improve.  Family remained polite throughout encounter but were clear that they do not want further involvement from palliative team at this point and requested not to return unless called.  Family has my card and will call if they are willing to meet further.  PMT will continue to follow peripherally, but will stop daily encounters per family request.  Family to call if they are willing to meet further.  Please call if there as specific areas with which we can be of assistance in the care of Mr. Hulgan.   Total time: 20 minutes Greater than 50%  of this time was spent counseling and coordinating care related to the above assessment and plan.  Micheline Rough, MD [place] Team [phone]

## 2017-06-14 NOTE — Progress Notes (Addendum)
PHARMACY - PHYSICIAN COMMUNICATION CRITICAL VALUE ALERT - BLOOD CULTURE IDENTIFICATION (BCID)  [name] is an 84 y.o. male who presented to [facility] on 06/04/2017 with a chief complaint of sepsis.  Assessment:  Sepsis resolved, completed antibiotic course for pneumonia, currently off antibiotics  Name of physician (or Provider) ContactedStarla Link  Current antibiotics: none  Changes to prescribed antibiotics recommended:  MD placed orders for vanc and zosyn per pharmacy  Results for orders placed or performed during the hospital encounter of 06/04/17  Blood Culture ID Panel (Reflexed) (Collected: 06/13/2017 10:19 AM)  Result Value Ref Range   Enterococcus species NOT DETECTED NOT DETECTED   Listeria monocytogenes NOT DETECTED NOT DETECTED   Staphylococcus species DETECTED (A) NOT DETECTED   Staphylococcus aureus NOT DETECTED NOT DETECTED   Methicillin resistance NOT DETECTED NOT DETECTED   Streptococcus species NOT DETECTED NOT DETECTED   Streptococcus agalactiae NOT DETECTED NOT DETECTED   Streptococcus pneumoniae NOT DETECTED NOT DETECTED   Streptococcus pyogenes NOT DETECTED NOT DETECTED   Acinetobacter baumannii NOT DETECTED NOT DETECTED   Enterobacteriaceae species PENDING NOT DETECTED   Enterobacter cloacae complex NOT DETECTED NOT DETECTED   Escherichia coli NOT DETECTED NOT DETECTED   Klebsiella oxytoca NOT DETECTED NOT DETECTED   Klebsiella pneumoniae NOT DETECTED NOT DETECTED   Proteus species NOT DETECTED NOT DETECTED   Serratia marcescens NOT DETECTED NOT DETECTED   Carbapenem resistance NOT DETECTED NOT DETECTED   Haemophilus influenzae NOT DETECTED NOT DETECTED   Neisseria meningitidis NOT DETECTED NOT DETECTED   Pseudomonas aeruginosa NOT DETECTED NOT DETECTED   Candida albicans NOT DETECTED NOT DETECTED   Candida glabrata NOT DETECTED NOT DETECTED   Candida krusei NOT DETECTED NOT DETECTED   Candida parapsilosis NOT DETECTED NOT DETECTED   Candida tropicalis  NOT DETECTED NOT DETECTED    Hershal Coria 06/14/2017  11:22 AM

## 2017-06-14 NOTE — Progress Notes (Signed)
Unsure of disposition plans at this time. CM will continue to follow along. Noted that PMT is working with pt and family on [place]. Marney Doctor RN,BSN,NCM [phone]
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
## 2017-06-14 NOTE — Progress Notes (Signed)
Pharmacy Antibiotic Note  Douglas Mckee is a 84 y.o. male admitted on 06/04/2017 with worsening dyspnea and vomiting. Patient completed course of antibiotics earlier this admission for sepsis secondary to pneumonia. With persistent leukocytosis, MD placed orders for Vancomycin and Zosyn. Imaging of chest, abd/pelvis, and head are pending. Aerobic bottle of 1 set of blood cultures from 1/16 + for Staph species. Pharmacy has been consulted for Vancomycin and Zosyn dosing.  Plan: Vancomycin 1500mg  IV x 1, then 1000mg  IV q24h. Plan for Vancomycin peak/trough levels at steady state.  Zosyn 3.375g IV q8h (infuse over 4 hours). Monitor renal function (daily SCr), cultures, clinical course.   Height: 5\' 5"  (165.1 cm) Weight: 167 lb 12.3 oz (76.1 kg) IBW/kg (Calculated) : 61.5  Temp (24hrs), Avg:99.5 F (37.5 C), Min:99.2 F (37.3 C), Max:99.8 F (37.7 C)  Recent Labs  Lab 06/10/17 1820 06/11/17 1048 06/12/17 0451 06/13/17 0530 06/14/17 0640  WBC 16.9* 19.3* 28.5* 33.9* 36.1*  CREATININE 0.88 0.84 0.90 0.86 0.84    Estimated Creatinine Clearance: 53.4 mL/min (by C-G formula based on SCr of 0.84 mg/dL).    No Known Allergies  Antimicrobials this admission: 1/7 Azithromycin x 1 1/7 Ceftriaxone x 1 1/7 Metronidazole x 1 1/7 Zosyn>>1/14 1/8 Vancomycin >> 1/9  Microbiology results: 1/7BCx: NGF 1/7 UCx:NGF 1/8 BCx: NGF 1/8 MRSA PCR: neg 1/8 Strep Ag: neg 1/8 resp panel: neg 1/16 BCx: GPC in aerobic bottle (1 set), BCID = Staph species  Thank you for allowing pharmacy to be a part of this patient's care.   Lindell Spar, PharmD, BCPS Pager: [phone] 06/14/2017 12:35 PM

## 2017-06-15 ENCOUNTER — Inpatient Hospital Stay (HOSPITAL_COMMUNITY): Payer: Medicare Other

## 2017-06-15 DIAGNOSIS — I5030 Unspecified diastolic (congestive) heart failure: Secondary | ICD-10-CM

## 2017-06-15 LAB — COMPREHENSIVE METABOLIC PANEL
ALT: 34 U/L (ref 17–63)
ANION GAP: 7 (ref 5–15)
AST: 26 U/L (ref 15–41)
Albumin: 2 g/dL — ABNORMAL LOW (ref 3.5–5.0)
Alkaline Phosphatase: 79 U/L (ref 38–126)
BUN: 33 mg/dL — ABNORMAL HIGH (ref 6–20)
CHLORIDE: 98 mmol/L — AB (ref 101–111)
CO2: 37 mmol/L — ABNORMAL HIGH (ref 22–32)
Calcium: 8 mg/dL — ABNORMAL LOW (ref 8.9–10.3)
Creatinine, Ser: 0.93 mg/dL (ref 0.61–1.24)
GFR calc non Af Amer: >60 mL/min (ref >60)
Glucose, Bld: 193 mg/dL — ABNORMAL HIGH (ref 65–99)
POTASSIUM: 3.8 mmol/L (ref 3.5–5.1)
Sodium: 142 mmol/L (ref 135–145)
Total Bilirubin: 0.7 mg/dL (ref 0.3–1.2)
Total Protein: 4.7 g/dL — ABNORMAL LOW (ref 6.5–8.1)

## 2017-06-15 LAB — CBC WITH DIFFERENTIAL/PLATELET
BASOS PCT: 0 %
Basophils Absolute: 0 10*3/uL (ref 0.0–0.1)
EOS ABS: 0 10*3/uL (ref 0.0–0.7)
Eosinophils Relative: 0 %
HCT: 32.2 % — ABNORMAL LOW (ref 39.0–52.0)
Hemoglobin: 10.4 g/dL — ABNORMAL LOW (ref 13.0–17.0)
LYMPHS ABS: 1.2 10*3/uL (ref 0.7–4.0)
Lymphocytes Relative: 4 %
MCH: 23 pg — AB (ref 26.0–34.0)
MCHC: 32.3 g/dL (ref 30.0–36.0)
MCV: 71.1 fL — ABNORMAL LOW (ref 78.0–100.0)
MONO ABS: 1.6 10*3/uL — AB (ref 0.1–1.0)
Monocytes Relative: 5 %
NEUTROS PCT: 91 %
Neutro Abs: 28.4 10*3/uL — ABNORMAL HIGH (ref 1.7–7.7)
PLATELETS: 204 10*3/uL (ref 150–400)
RBC: 4.53 MIL/uL (ref 4.22–5.81)
RDW: 17.2 % — AB (ref 11.5–15.5)
WBC: 31.2 10*3/uL — ABNORMAL HIGH (ref 4.0–10.5)

## 2017-06-15 LAB — GLUCOSE, CAPILLARY
GLUCOSE-CAPILLARY: 199 mg/dL — AB (ref 65–99)
GLUCOSE-CAPILLARY: 168 mg/dL — AB (ref 65–99)
Glucose-Capillary: 162 mg/dL — ABNORMAL HIGH (ref 65–99)
Glucose-Capillary: 137 mg/dL — ABNORMAL HIGH (ref 65–99)
Glucose-Capillary: 135 mg/dL — ABNORMAL HIGH (ref 65–99)

## 2017-06-15 LAB — MAGNESIUM: MAGNESIUM: 2 mg/dL (ref 1.7–2.4)

## 2017-06-15 LAB — ECHOCARDIOGRAM COMPLETE
HEIGHTINCHES: 65 in
WEIGHTICAEL: 2490.32 [oz_av]

## 2017-06-15 MED ORDER — FUROSEMIDE 10 MG/ML IJ SOLN
80.0000 mg | Freq: Three times a day (TID) | INTRAMUSCULAR | Status: DC
  Administered 2017-06-15 – 2017-06-17 (×6): 80 mg via INTRAVENOUS
  Filled 2017-06-15 (×6): qty 8

## 2017-06-15 MED ORDER — FUROSEMIDE 10 MG/ML IJ SOLN
INTRAMUSCULAR | Status: AC
  Filled 2017-06-15: qty 4

## 2017-06-15 NOTE — Progress Notes (Addendum)
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 Oral care QID Other Recommendations Have oral suction available   CHL IP FOLLOW UP RECOMMENDATIONS 06/13/2017 Follow up Recommendations None   CHL IP FREQUENCY AND DURATION 08/07/2015 Speech Therapy Frequency (ACUTE ONLY) min 2x/week Treatment Duration 2 weeks      CHL IP ORAL PHASE 06/13/2017 Oral Phase Impaired Oral - Pudding Teaspoon -- Oral - Pudding Cup -- Oral - Honey Teaspoon -- Oral - Honey Cup -- Oral - Nectar Teaspoon Decreased bolus cohesion;Premature spillage;Delayed oral transit;Weak lingual manipulation;Reduced posterior propulsion;Holding of bolus;Lingual/palatal residue Oral - Nectar Cup Delayed oral transit;Decreased bolus cohesion;Premature spillage;Weak lingual manipulation;Reduced posterior propulsion;Holding of bolus;Lingual/palatal residue Oral - Nectar Straw -- Oral - Thin Teaspoon Holding of bolus;Reduced posterior propulsion;Delayed oral transit;Decreased bolus cohesion;Weak lingual manipulation;Premature spillage;Lingual/palatal residue Oral - Thin Cup Weak lingual manipulation;Reduced posterior propulsion;Holding of bolus;Delayed oral transit;Decreased bolus cohesion;Premature  spillage;Lingual/palatal residue Oral - Thin Straw -- Oral - Puree Holding of bolus;Premature spillage;Delayed oral transit;Decreased bolus cohesion;Reduced posterior propulsion;Lingual/palatal residue Oral - Mech Soft -- Oral - Regular -- Oral - Multi-Consistency -- Oral - Pill -- Oral Phase - Comment use of dry spoon stimulation not effective to elicit swallow, boluses essentially spill into pharynx with delayed swallow, SLP orally suctioned large amounts of barium resdiuals from pt's oral cavity  CHL IP PHARYNGEAL PHASE 06/13/2017 Pharyngeal Phase Impaired Pharyngeal- Pudding Teaspoon -- Pharyngeal -- Pharyngeal- Pudding Cup -- Pharyngeal -- Pharyngeal- Honey Teaspoon -- Pharyngeal -- Pharyngeal- Honey Cup -- Pharyngeal -- Pharyngeal- Nectar Teaspoon NT Pharyngeal -- Pharyngeal- Nectar Cup Reduced pharyngeal peristalsis;Reduced epiglottic inversion;Reduced anterior laryngeal mobility;Reduced laryngeal elevation;Reduced airway/laryngeal closure;Reduced tongue base retraction;Penetration/Aspiration before swallow;Penetration/Aspiration during swallow;Moderate aspiration;Pharyngeal residue - valleculae;Pharyngeal residue - pyriform;Pharyngeal residue - cp segment Pharyngeal Material enters airway, passes BELOW cords without attempt by patient to eject out (silent aspiration);Material enters airway, passes BELOW cords and not ejected out despite cough attempt by patient Pharyngeal- Nectar Straw -- Pharyngeal -- Pharyngeal- Thin Teaspoon (No Data) Pharyngeal -- Pharyngeal- Thin Cup Delayed swallow initiation-vallecula;Delayed swallow initiation-pyriform sinuses;Reduced pharyngeal peristalsis;Reduced epiglottic inversion;Reduced anterior laryngeal mobility;Reduced laryngeal elevation;Reduced airway/laryngeal closure;Reduced tongue base retraction;Penetration/Aspiration during swallow;Penetration/Apiration after swallow;Pharyngeal residue - valleculae;Pharyngeal residue - pyriform;Pharyngeal residue - cp segment  Pharyngeal Material enters airway, passes BELOW cords without attempt by patient to eject out (silent aspiration) Pharyngeal- Thin Straw -- Pharyngeal -- Pharyngeal- Puree  Delayed swallow initiation-vallecula;Reduced pharyngeal peristalsis;Reduced epiglottic inversion;Reduced anterior laryngeal mobility;Reduced laryngeal elevation;Reduced airway/laryngeal closure;Reduced tongue base retraction;Pharyngeal residue - valleculae;Pharyngeal residue - pyriform;Pharyngeal residue - cp segment Pharyngeal -- Pharyngeal- Mechanical Soft -- Pharyngeal -- Pharyngeal- Regular -- Pharyngeal -- Pharyngeal- Multi-consistency -- Pharyngeal -- Pharyngeal- Pill -- Pharyngeal -- Pharyngeal Comment --  CHL IP CERVICAL ESOPHAGEAL PHASE 06/13/2017 Cervical Esophageal Phase Impaired Pudding Teaspoon -- Pudding Cup -- Honey Teaspoon -- Honey Cup -- Nectar Teaspoon Reduced cricopharyngeal relaxation Nectar Cup Reduced cricopharyngeal relaxation Nectar Straw -- Thin Teaspoon Reduced cricopharyngeal relaxation Thin Cup Reduced cricopharyngeal relaxation Thin Straw -- Puree Reduced cricopharyngeal relaxation Mechanical Soft -- Regular -- Multi-consistency -- Pill -- Cervical Esophageal Comment trace backflow of liquids into distal pharynx without awareness No flowsheet data found. Macario Golds 06/13/2017, 12:43 PM Luanna Salk, MS [facility] SLP [phone]                   Scheduled Meds: . chlorhexidine  15 mL Mouth Rinse BID  . enoxaparin (LOVENOX) injection  40 mg Subcutaneous Q24H  . feeding supplement (OSMOLITE 1.2 CAL)  1,000 mL Per Tube Q24H  . free water  50 mL Per Tube Q4H  . furosemide  40 mg Intravenous Q8H  . insulin glargine  10 Units Subcutaneous Daily  . levalbuterol  0.63 mg Nebulization TID  . mouth rinse  15 mL Mouth Rinse q12n4p  . metoprolol tartrate  5 mg Intravenous Q6H  . scopolamine  1 patch Transdermal Q72H  . sodium chloride flush  10-40 mL Intracatheter Q12H   Continuous Infusions: . dextrose 5 %  with KCl 20 mEq / L 20 mEq (06/15/17 1015)  . famotidine (PEPCID) IV Stopped (06/15/17 1051)  . piperacillin-tazobactam (ZOSYN)  IV 3.375 g (06/15/17 0545)  . sodium chloride    . vancomycin       LOS: 11 days        Aline August, MD [facility] Pager [phone]  If 7PM-7AM, please contact night-coverage [email or web address] Password [email or web address] 06/15/2017, 11:03 AM

## 2017-06-15 NOTE — Progress Notes (Signed)
  Echocardiogram 2D Echocardiogram has been performed.  Darlina Sicilian M 06/15/2017, 2:19 PM

## 2017-06-15 NOTE — Consult Note (Signed)
PULMONARY / CRITICAL CARE MEDICINE   Name: Douglas Mckee MRN: [id number] DOB: 11-Mar-1923    ADMISSION DATE:  06/04/2017 CONSULTATION DATE:  06/15/2017  REFERRING MD:  Doyle Askew  CHIEF COMPLAINT: failure to improve from PNA, CHF  HISTORY OF PRESENT ILLNESS:   84 year old man with a history of dementia and a complicated hospitalization that has included aspiration pneumonia in the setting of emesis that seemed to be initiated by gallbladder disease.  He has been weak and had recurrent aspiration and waxing and waning hypoxemia and respiratory status.  Course complicated by cardiogenic pulmonary edema, bilateral moderate pleural effusions.  His sepsis has resolved but he still is on high flow oxygen.  His family hopes to see improvement with aggressive medical care.  He is DNR should he decompensate.  We were asked to see him again 1/18 given his failure to improve.  A CT scan of his chest was done on 06/14/16 that I reviewed.  This shows some scattered cystic change, superimposed scattered groundglass infiltrate consistent with pneumonia at various degrees of resolution, bilateral moderate pleural effusions.  I do not see any overt thick-walled cavitary lesions that would suggest to me an embolic process. A TTE was ordered to assess for vegetations.    PAST MEDICAL HISTORY :  He  has a past medical history of AKI (acute kidney injury) ([place]), Alzheimers disease, Anemia of chronic disease, Aspiration pneumonia of right lower lobe ([place]), BPH (benign prostatic hyperplasia), CVA (cerebral infarction), Dysphagia, Gout, Hypertensive urgency, malignant, Influenza A, Kidney infection, Left humeral fracture, Leukocytosis, Metabolic encephalopathy, Microcytic anemia, Physical deconditioning, Pneumonia, PSA elevation, Sepsis ([place]), Thrombocytosis ([place]), TIA (transient ischemic attack), and UTI (lower urinary tract infection).  PAST SURGICAL HISTORY: He  has a past surgical history that includes Cataract extraction  (Left, 2012) and Mouth surgery (01/2012).  No Known Allergies  No current facility-administered medications on file prior to encounter.    Current Outpatient Medications on File Prior to Encounter  Medication Sig  . Multiple Vitamin (MULTIVITAMIN WITH MINERALS) TABS Take 1 tablet by mouth every morning.   . vitamin C (ASCORBIC ACID) 500 MG tablet Take 1,000 mg by mouth daily.  Marland Kitchen acetaminophen (TYLENOL) 325 MG tablet Take 2 tablets (650 mg total) by mouth every 6 (six) hours as needed for mild pain (or Fever >/= 101). (Patient not taking: Reported on 06/04/2017)  . albuterol (PROVENTIL HFA;VENTOLIN HFA) 108 (90 Base) MCG/ACT inhaler Inhale 2 puffs into the lungs every 4 (four) hours as needed for wheezing or shortness of breath (or cough). (Patient not taking: Reported on 06/04/2017)  . benzonatate (TESSALON) 100 MG capsule Take 1 capsule (100 mg total) by mouth every 8 (eight) hours. (Patient not taking: Reported on 06/04/2017)  . tamsulosin (FLOMAX) 0.4 MG CAPS Take 1 capsule (0.4 mg total) by mouth daily. (Patient not taking: Reported on 06/04/2017)    FAMILY HISTORY:  His indicated that the status of his sister is unknown. He indicated that the status of his neg hx is unknown.   SOCIAL HISTORY: He  reports that  has never smoked. he has never used smokeless tobacco. He reports that he does not drink alcohol or use drugs.  REVIEW OF SYSTEMS:   Cannot obtain   SUBJECTIVE:  As above  VITAL SIGNS: BP (!) 117/58 (BP Location: Left Arm)   Pulse (!) 106   Temp 98.9 F (37.2 C) (Oral)   Resp 18   Ht 5\' 5"  (1.651 m)   Wt 70.6 kg (155 lb 10.3 oz)  SpO2 97%   BMI 25.90 kg/m   HEMODYNAMICS:    VENTILATOR SETTINGS:    INTAKE / OUTPUT: I/O last 3 completed shifts: In: 1840 [I.V.:600; NG/GT:590; IV Piggyback:650] Out: 2875 [Urine:1550; Emesis/NG output:1325]  PHYSICAL EXAMINATION:  General: thin elderly man, NAD HENT: NGT in place, edentulous PULM: comfortable resp pattern,  decreased both bases, B insp crackles.  CV: regular, no M GI: Minimal bowel sounds, soft, nontender MSK: normal bulk and tone Neuro: wakes to voice and tracks, aphasic, moves his UEs  LABS:  BMET Recent Labs  Lab 06/13/17 0530 06/14/17 0640 06/15/17 0550  NA 149* 147* 142  K 3.9 4.1 3.8  CL 110 104 98*  CO2 34* 37* 37*  BUN 37* 34* 33*  CREATININE 0.86 0.84 0.93  GLUCOSE 195* 153* 193*    Electrolytes Recent Labs  Lab 06/10/17 1820 06/11/17 1048  06/13/17 0530 06/14/17 0640 06/15/17 0550  CALCIUM 8.3* 8.3*   < > 8.3* 8.1* 8.0*  MG 2.1 2.0   < > 2.1 2.0 2.0  PHOS 2.1* 2.2*  --   --   --   --    < > = values in this interval not displayed.    CBC Recent Labs  Lab 06/13/17 0530 06/14/17 0640 06/15/17 0550  WBC 33.9* 36.1* 31.2*  HGB 11.3* 11.4* 10.4*  HCT 35.1* 35.3* 32.2*  PLT 206 216 204    Coag's No results for input(s): APTT, INR in the last 168 hours.  Sepsis Markers No results for input(s): LATICACIDVEN, PROCALCITON, O2SATVEN in the last 168 hours.  ABG No results for input(s): PHART, PCO2ART, PO2ART in the last 168 hours.  Liver Enzymes Recent Labs  Lab 06/12/17 0451 06/14/17 0640 06/15/17 0550  AST 26 48* 26  ALT 35 43 34  ALKPHOS 87 90 79  BILITOT 1.1 0.7 0.7  ALBUMIN 2.4* 2.2* 2.0*    Cardiac Enzymes Recent Labs  Lab 06/10/17 1820  TROPONINI 0.03*    Glucose Recent Labs  Lab 06/14/17 1221 06/14/17 1718 06/14/17 2029 06/15/17 0430 06/15/17 0744 06/15/17 1138  GLUCAP 178* 145* 205* 199* 168* 162*    Imaging No results found.   STUDIES:  1/7 RUQ ultrasound> gallbladder full of stones, difficult to visualize wall of GB, CBD normal diameter  CULTURES: 1/7 Blood > negative 1/7 Urine > negative 1/16 blood >> staph species >>  ANTIBIOTICS: 1/7 azithro > 1/7 1/7 ceftriaxone > 1/7 1/7 flagyl > 1/7 1/7 zosyn > ?? 1/17 zosyn >>  1/17 vanco >>     DISCUSSION: 84 y/o male with a complicated long course,  characterized by recurrent aspiration and recurrent bouts of PNA +/- pneumonitis. His overall improvement has stalled due to recurrent aspiration insults, deconditioning, pleural effusions. I suspect that the effusions are due to malnutrition, dCHF. His CT chest shows scattered infiltrates superimposed on cystic changes. Not clear that there is an embolic or cavitary process here.   I explained all the findings to multiple family members at bedside. Discussed the pros / cons of thoracentesis. At this point I would defer given the high likelihood that the fluid is a transudate and would return. They will consider this. If they want a thoracentesis then we can perform or do in IR.   Also discussed the possibility that he isn't going to improve because aspiration is the underlying problem. We broached the subject of possibly acknowledging this and just letting him eat, accepting the consequence that he will get pneumonia, won't live as long. They  understand that we may get to that point during this hospitalization.   My recs: Continue abx pending any cx data Diuresis as he can tolerate Nutrition via NGT Follow for swallowing improvement Defer thoracentesis for now.  Continue to discuss goals of care, especially if he fails to rebound.   Baltazar Apo, MD, PhD 06/15/2017, 5:54 PM [facility] [phone] or if no answer [phone]

## 2017-06-16 LAB — CBC WITH DIFFERENTIAL/PLATELET
BASOS ABS: 0 10*3/uL (ref 0.0–0.1)
Basophils Relative: 0 %
Eosinophils Absolute: 0.2 10*3/uL (ref 0.0–0.7)
Eosinophils Relative: 1 %
HCT: 34 % — ABNORMAL LOW (ref 39.0–52.0)
HEMOGLOBIN: 11 g/dL — AB (ref 13.0–17.0)
Lymphocytes Relative: 5 %
Lymphs Abs: 1.2 10*3/uL (ref 0.7–4.0)
MCH: 22.8 pg — AB (ref 26.0–34.0)
MCHC: 32.4 g/dL (ref 30.0–36.0)
MCV: 70.4 fL — AB (ref 78.0–100.0)
MONOS PCT: 7 %
Monocytes Absolute: 1.7 10*3/uL — ABNORMAL HIGH (ref 0.1–1.0)
Neutro Abs: 21.3 10*3/uL — ABNORMAL HIGH (ref 1.7–7.7)
Neutrophils Relative %: 87 %
PLATELETS: 237 10*3/uL (ref 150–400)
RBC: 4.83 MIL/uL (ref 4.22–5.81)
RDW: 16.7 % — ABNORMAL HIGH (ref 11.5–15.5)
WBC: 24.4 10*3/uL — ABNORMAL HIGH (ref 4.0–10.5)

## 2017-06-16 LAB — CULTURE, BLOOD (ROUTINE X 2): Special Requests: ADEQUATE

## 2017-06-16 LAB — BASIC METABOLIC PANEL
Anion gap: 8 (ref 5–15)
BUN: 31 mg/dL — AB (ref 6–20)
CHLORIDE: 90 mmol/L — AB (ref 101–111)
CO2: 41 mmol/L — ABNORMAL HIGH (ref 22–32)
Calcium: 8 mg/dL — ABNORMAL LOW (ref 8.9–10.3)
Creatinine, Ser: 1.06 mg/dL (ref 0.61–1.24)
GFR calc Af Amer: >60 mL/min (ref >60)
GFR calc non Af Amer: 59 mL/min — ABNORMAL LOW (ref >60)
Glucose, Bld: 142 mg/dL — ABNORMAL HIGH (ref 65–99)
POTASSIUM: 3.4 mmol/L — AB (ref 3.5–5.1)
Sodium: 139 mmol/L (ref 135–145)

## 2017-06-16 LAB — MAGNESIUM: Magnesium: 2.1 mg/dL (ref 1.7–2.4)

## 2017-06-16 LAB — GLUCOSE, CAPILLARY
GLUCOSE-CAPILLARY: 150 mg/dL — AB (ref 65–99)
GLUCOSE-CAPILLARY: 152 mg/dL — AB (ref 65–99)
Glucose-Capillary: 168 mg/dL — ABNORMAL HIGH (ref 65–99)
Glucose-Capillary: 152 mg/dL — ABNORMAL HIGH (ref 65–99)

## 2017-06-16 NOTE — Progress Notes (Signed)
Patient ID: Douglas Mckee, male   DOB: [birth year], 84 y.o.   MRN: [id number]  PROGRESS NOTE    Douglas Mckee  HWK:[id number] DOB: 1922/08/05 DOA: 06/04/2017 PCP: Verline Lema, MD   Brief Narrative:  84 year old male with history of BPH, hypertension, gout, previous TIA, dementia presented to the ER with several days of progressively worsening dyspnea along with vomiting.  He was found to be febrile, tachycardic with elevated lactic acid, leukocytosis chest x-ray showed bilateral airspace disease.  He was started on intravenous antibiotics.  Critical care was consulted. Patient was initially admitted in stepdown unit. PCCM has signed off. Patient was transferred to floor. Overall condition not improving. Palliative care was consulted.  Assessment & Plan:   Principal Problem:   Sepsis due to pneumonia [facility]) Active Problems:   Metabolic encephalopathy   BPH (benign prostatic hyperplasia)   Alzheimer's dementia   Demand ischemia (HCC)   Transaminitis   Acute respiratory failure with hypoxemia (HCC)   Pneumonia of both lungs due to infectious organism   Goals of care, counseling/discussion   Palliative care encounter   SOB (shortness of breath)   Pressure injury of skin   Essential hypertension    1. Sepsis ? Resolved.  Hemodynamically stable.  Antibiotics restarted on 06/14/2017  2. Acute resp failure with hypoxia ? Initially required BiPAP.  Currently on nasal cannula.  Wean off oxygen as able. Patient with slow improvement. PCCM was reconsulted.   ? Family desires to continue aggressive medical care, however no aggressive measures such as intubation.  Patient continues to be DNR. ? Off Solu-Medrol.   3. Bilateral pneumonia ? Patient had already been treated with Zosyn for 7 days and antibiotics were discontinued ? CT scan of the chest done on 06/14/2017 showed multifocal cavitary pneumonia with concerns for atypical organisms versus septic emboli as well along with moderate to large  bilateral pleural effusions.  Patient was restarted on vancomycin and Zosyn on 06/14/2017.  Discussed with family members multiple times that patient should be made hospice/comfort measures.  They are still not decided yet. PCCM consulted yesterday, will follow recommendations.  We will hold off on thoracentesis for now ? Continue Lasix 4. Transaminitis with Cholelithiasis ? LFTs improved.  Doubt that the patient would be a surgical candidate 5. Metabolic encephalopathy ? Mental status not improving  6.  Hypertension ? Blood pressure improving.  Continue IV Lasix and PRN IV antihypertensives 7. Hyperglycemia ? Probably reactive from steroid use.  Monitor.  Continue Lantus. 8.  Generalized deconditioning      -Overall prognosis is guarded to poor.  Palliative care team was following.  Patient's family members do not want any further input from palliative care team at the current time.  Patient's condition is not improving, family should consider making him hospice/comfort measures.  Family members not yet ready to make that decision.   9.  Leukocytosis: Slight improvement since yesterday.  Continue current antibiotics.  Overall prognosis is very poor as mentioned above  10.  Hypernatremia: Improved.   11.  Coagulase-negative bacteremia: 1 out of 2 sets positive.  Probably contaminant.    DVT prophylaxis: Lovenox Code Status: DNR Family Communication: Discussed with grandson at bedside Disposition Plan: Depends on clinical outcome  Consultants: PCCM Palliative care  Procedures: None  Antimicrobials: Zosyn from 06/04/2017-06/11/2017 Zosyn and vancomycin from 06/14/2017 onwards  Subjective: Patient seen and examined at bedside.  He is sleepy,  does not answer any questions.  Spoke to grandson at bedside.  No overnight  fever or vomiting.    Objective: Vitals:   06/15/17 2127 06/16/17 0419 06/16/17 0912 06/16/17 1303  BP: 120/64 113/60  (!) 142/88  Pulse: 100 94  (!) 104  Resp: 16 14  16     Temp: 98.7 F (37.1 C) 98.6 F (37 C)  99.5 F (37.5 C)  TempSrc: Oral Oral  Axillary  SpO2: 94% 96% 93% 99%  Weight:  71 kg (156 lb 8.4 oz)    Height:        Intake/Output Summary (Last 24 hours) at 06/16/2017 1320 Last data filed at 06/16/2017 1227 Gross per 24 hour  Intake 2009.75 ml  Output 5500 ml  Net -3490.25 ml   Filed Weights   06/13/17 0544 06/15/17 0435 06/16/17 0419  Weight: 76.1 kg (167 lb 12.3 oz) 70.6 kg (155 lb 10.3 oz) 71 kg (156 lb 8.4 oz)    Examination:  General exam: Elderly male lying in bed.  Does not wake up on calling his name.  NG tube present respiratory system: Bilateral decreased breath sound at bases with scattered crackles Cardiovascular system: S1 & S2 heard, tachycardic.  Gastrointestinal system: Abdomen is nondistended, soft and nontender. Normal bowel sounds heard. Extremities: No cyanosis, clubbing; trace edema  Data Reviewed: I have personally reviewed following labs and imaging studies  CBC: Recent Labs  Lab 06/12/17 0451 06/13/17 0530 06/14/17 0640 06/15/17 0550 06/16/17 0430  WBC 28.5* 33.9* 36.1* 31.2* 24.4*  NEUTROABS  --   --   --  28.4* 21.3*  HGB 12.8* 11.3* 11.4* 10.4* 11.0*  HCT 38.4* 35.1* 35.3* 32.2* 34.0*  MCV 69.9* 71.5* 71.0* 71.1* 70.4*  PLT 233 206 216 204 638   Basic Metabolic Panel: Recent Labs  Lab 06/10/17 1820 06/11/17 1048 06/12/17 0451 06/13/17 0530 06/14/17 0640 06/15/17 0550 06/16/17 0430  NA 148* 149* 151* 149* 147* 142 139  K 3.4* 3.2* 3.1* 3.9 4.1 3.8 3.4*  CL 113* 117* 113* 110 104 98* 90*  CO2 29 28 30  34* 37* 37* 41*  GLUCOSE 137* 223* 192* 195* 153* 193* 142*  BUN 32* 38* 37* 37* 34* 33* 31*  CREATININE 0.88 0.84 0.90 0.86 0.84 0.93 1.06  CALCIUM 8.3* 8.3* 8.4* 8.3* 8.1* 8.0* 8.0*  MG 2.1 2.0 2.1 2.1 2.0 2.0 2.1  PHOS 2.1* 2.2*  --   --   --   --   --    GFR: Estimated Creatinine Clearance: 38.7 mL/min (by C-G formula based on SCr of 1.06 mg/dL). Liver Function Tests: Recent  Labs  Lab 06/12/17 0451 06/14/17 0640 06/15/17 0550  AST 26 48* 26  ALT 35 43 34  ALKPHOS 87 90 79  BILITOT 1.1 0.7 0.7  PROT 5.4* 4.9* 4.7*  ALBUMIN 2.4* 2.2* 2.0*   No results for input(s): LIPASE, AMYLASE in the last 168 hours. No results for input(s): AMMONIA in the last 168 hours. Coagulation Profile: No results for input(s): INR, PROTIME in the last 168 hours. Cardiac Enzymes: Recent Labs  Lab 06/10/17 1820  TROPONINI 0.03*   BNP (last 3 results) No results for input(s): PROBNP in the last 8760 hours. HbA1C: No results for input(s): HGBA1C in the last 72 hours. CBG: Recent Labs  Lab 06/15/17 1138 06/15/17 1758 06/15/17 2120 06/16/17 0729 06/16/17 1133  GLUCAP 162* 137* 135* 168* 152*   Lipid Profile: No results for input(s): CHOL, HDL, LDLCALC, TRIG, CHOLHDL, LDLDIRECT in the last 72 hours. Thyroid Function Tests: No results for input(s): TSH, T4TOTAL, FREET4, T3FREE, THYROIDAB in the  last 72 hours. Anemia Panel: No results for input(s): VITAMINB12, FOLATE, FERRITIN, TIBC, IRON, RETICCTPCT in the last 72 hours. Sepsis Labs: No results for input(s): PROCALCITON, LATICACIDVEN in the last 168 hours.  Recent Results (from the past 240 hour(s))  Culture, blood (routine x 2)     Status: Abnormal   Collection Time: 06/13/17 10:19 AM  Result Value Ref Range Status   Specimen Description BLOOD LEFT HAND  Final   Special Requests IN PEDIATRIC BOTTLE Blood Culture adequate volume  Final   Culture  Setup Time   Final    GRAM POSITIVE COCCI AEROBIC BOTTLE ONLY CRITICAL RESULT CALLED TO, READ BACK BY AND VERIFIED WITH: EBurman Foster.D. 11:15 06/14/17 ([email or web address])    Culture (A)  Final    STAPHYLOCOCCUS SPECIES (COAGULASE NEGATIVE) THE SIGNIFICANCE OF ISOLATING THIS ORGANISM FROM A SINGLE SET OF BLOOD CULTURES WHEN MULTIPLE SETS ARE DRAWN IS UNCERTAIN. PLEASE NOTIFY THE MICROBIOLOGY DEPARTMENT WITHIN ONE WEEK IF SPECIATION AND SENSITIVITIES ARE REQUIRED. Performed at  [facility], [place] [address], [place], [place] [address]    Report Status 06/16/2017 FINAL  Final  Culture, blood (routine x 2)     Status: None (Preliminary result)   Collection Time: 06/13/17 10:19 AM  Result Value Ref Range Status   Specimen Description BLOOD LEFT HAND  Final   Special Requests IN PEDIATRIC BOTTLE Blood Culture adequate volume  Final   Culture   Final    NO GROWTH 2 DAYS Performed at [facility], [place] [address], [place], [place] [address]    Report Status PENDING  Incomplete  Blood Culture ID Panel (Reflexed)     Status: Abnormal   Collection Time: 06/13/17 10:19 AM  Result Value Ref Range Status   Enterococcus species NOT DETECTED NOT DETECTED Final   Listeria monocytogenes NOT DETECTED NOT DETECTED Final   Staphylococcus species DETECTED (A) NOT DETECTED Final    Comment: Methicillin (oxacillin) susceptible coagulase negative staphylococcus. Possible blood culture contaminant (unless isolated from more than one blood culture draw or clinical case suggests pathogenicity). No antibiotic treatment is indicated for blood  culture contaminants. CRITICAL RESULT CALLED TO, READ BACK BY AND VERIFIED WITH: EBurman Foster.D. 11:15 06/14/17 ([email or web address])    Staphylococcus aureus NOT DETECTED NOT DETECTED Final   Methicillin resistance NOT DETECTED NOT DETECTED Final   Streptococcus species NOT DETECTED NOT DETECTED Final   Streptococcus agalactiae NOT DETECTED NOT DETECTED Final   Streptococcus pneumoniae NOT DETECTED NOT DETECTED Final   Streptococcus pyogenes NOT DETECTED NOT DETECTED Final   Acinetobacter baumannii NOT DETECTED NOT DETECTED Final   Enterobacteriaceae species NOT DETECTED NOT DETECTED Final   Enterobacter cloacae complex NOT DETECTED NOT DETECTED Final   Escherichia coli NOT DETECTED NOT DETECTED Final   Klebsiella oxytoca NOT DETECTED NOT DETECTED Final   Klebsiella pneumoniae NOT DETECTED NOT DETECTED Final   Proteus species NOT DETECTED  NOT DETECTED Final   Serratia marcescens NOT DETECTED NOT DETECTED Final   Carbapenem resistance NOT DETECTED NOT DETECTED Final   Haemophilus influenzae NOT DETECTED NOT DETECTED Final   Neisseria meningitidis NOT DETECTED NOT DETECTED Final   Pseudomonas aeruginosa NOT DETECTED NOT DETECTED Final   Candida albicans NOT DETECTED NOT DETECTED Final   Candida glabrata NOT DETECTED NOT DETECTED Final   Candida krusei NOT DETECTED NOT DETECTED Final   Candida parapsilosis NOT DETECTED NOT DETECTED Final   Candida tropicalis NOT DETECTED NOT DETECTED Final  Radiology Studies: Ct Head Wo Contrast  Result Date: 06/14/2017 CLINICAL DATA:  Altered level of consciousness.  Sepsis. EXAM: CT HEAD WITHOUT CONTRAST TECHNIQUE: Contiguous axial images were obtained from the base of the skull through the vertex without intravenous contrast. COMPARISON:  10/17/2016 FINDINGS: Brain: No acute intracranial abnormalities. No abnormal extra-axial fluid collection, intracranial hemorrhage or mass. There is no evidence for acute intracranial hemorrhage, mass or mass effect. No evidence for acute brain infarct.There is mild diffuse low-attenuation within the subcortical and periventricular white matter compatible with chronic microvascular disease. Chronic lacunar infarcts are identified within the basal ganglia and brainstem. Similar to previous exam. Prominence of the sulci and ventricles identified compatible with atrophy. Vascular: No hyperdense vessel or unexpected calcification. Skull: Normal. Negative for fracture or focal lesion. Sinuses/Orbits: No acute finding. Other: None. IMPRESSION: 1. No acute intracranial abnormalities. 2. Advanced chronic small vessel ischemic change and brain atrophy. Electronically Signed   By: Kerby Moors M.D.   On: 06/14/2017 16:41   Ct Chest W Contrast  Result Date: 06/14/2017 CLINICAL DATA:  Acute respiratory failure and sepsis secondary to pneumonia. EXAM: CT CHEST,  ABDOMEN, AND PELVIS WITH CONTRAST TECHNIQUE: Multidetector CT imaging of the chest, abdomen and pelvis was performed following the standard protocol during bolus administration of intravenous contrast. CONTRAST:  122mL ISOVUE-300 IOPAMIDOL (ISOVUE-300) INJECTION 61% COMPARISON:  CT abdomen pelvis dated Oct 18, 2012. FINDINGS: CT CHEST FINDINGS Cardiovascular: Right-sided PICC line with the tip at the cavoatrial junction. Normal heart size. No pericardial effusion. Normal caliber thoracic aorta. Coronary, aortic arch, and branch vessel atherosclerotic vascular disease. No central pulmonary embolism. Mediastinum/Nodes: No enlarged mediastinal, hilar, or axillary lymph nodes. Thyroid gland, trachea, and esophagus demonstrate no significant findings. Lungs/Pleura: Secretions within the right mainstem bronchus extending into the right lower lobe airways. Diffuse interlobular septal thickening. Mild ground-glass density in the left greater than right upper lobes. Tree-in-bud opacities in the right lung apex. Scattered small cavitary nodules in both lungs. Scarring and bronchiectasis in the lingula. Scattered pulmonary parenchymal cysts. Moderate to large bilateral pleural effusions with bibasilar atelectasis. Areas of nonenhancing consolidation within the right lower lobe likely reflect pneumonia. Musculoskeletal: Bilateral gynecomastia. No acute or suspicious osseous findings. Degenerative changes of the thoracic spine. CT ABDOMEN PELVIS FINDINGS Hepatobiliary: No focal liver abnormality is seen. No gallstones, gallbladder wall thickening, or biliary dilatation. Pancreas: Unremarkable. No pancreatic ductal dilatation or surrounding inflammatory changes. Spleen: Normal in size without focal abnormality. Adrenals/Urinary Tract: Adrenal glands are unremarkable. Kidneys are normal, without renal calculi, focal lesion, or hydronephrosis. Bladder is distended and mildly thick-walled. Stomach/Bowel: Enteric tube with the tip in  the gastric body. The stomach is within normal limits. No bowel wall thickening, obstruction, or surrounding inflammatory changes. Oral contrast within the colon. Normal appendix. Vascular/Lymphatic: Aortic atherosclerosis. No enlarged abdominal or pelvic lymph nodes. Reproductive: Markedly enlarged prostate gland, indenting the bladder base. Other: No free fluid or pneumoperitoneum. Musculoskeletal: No acute or suspicious osseous findings. Severe degenerative changes of the lumbar spine are similar to prior study. IMPRESSION: CT chest: 1. Scattered small cavitary nodules in both lungs with tree-in-bud nodularity in the right lung apex, suspicious for multifocal cavitary pneumonia. Mycobacterial organisms could cause both cavitary pneumonia and tree-in-bud nodularity, although the tree-in-bud nodularity could reflect atypical infection superimposed on cavitary pneumonia. Given the cavitary nodules, septic emboli could have a similar appearance. 2. Additional nonenhancing consolidation within the right lower also likely reflects pneumonia. 3. Moderate to large bilateral pleural effusions with bibasilar atelectasis. Secretions within  the right mainstem bronchus and lower lobe bronchi also contribute to decreased aeration in the right lower lobe. 4. Diffuse interlobular septal thickening with areas of ground-glass density in the left greater than right upper lobes, likely reflecting mild alveolar and interstitial pulmonary edema. 5.  Aortic atherosclerosis (ICD10-I70.0). CT abdomen and pelvis: 1.  No acute intra-abdominal process. 2. Markedly enlarged prostate gland with evidence of chronic bladder outlet obstruction. Electronically Signed   By: Titus Dubin M.D.   On: 06/14/2017 16:59   Ct Abdomen Pelvis W Contrast  Result Date: 06/14/2017 CLINICAL DATA:  Acute respiratory failure and sepsis secondary to pneumonia. EXAM: CT CHEST, ABDOMEN, AND PELVIS WITH CONTRAST TECHNIQUE: Multidetector CT imaging of the  chest, abdomen and pelvis was performed following the standard protocol during bolus administration of intravenous contrast. CONTRAST:  163mL ISOVUE-300 IOPAMIDOL (ISOVUE-300) INJECTION 61% COMPARISON:  CT abdomen pelvis dated Oct 18, 2012. FINDINGS: CT CHEST FINDINGS Cardiovascular: Right-sided PICC line with the tip at the cavoatrial junction. Normal heart size. No pericardial effusion. Normal caliber thoracic aorta. Coronary, aortic arch, and branch vessel atherosclerotic vascular disease. No central pulmonary embolism. Mediastinum/Nodes: No enlarged mediastinal, hilar, or axillary lymph nodes. Thyroid gland, trachea, and esophagus demonstrate no significant findings. Lungs/Pleura: Secretions within the right mainstem bronchus extending into the right lower lobe airways. Diffuse interlobular septal thickening. Mild ground-glass density in the left greater than right upper lobes. Tree-in-bud opacities in the right lung apex. Scattered small cavitary nodules in both lungs. Scarring and bronchiectasis in the lingula. Scattered pulmonary parenchymal cysts. Moderate to large bilateral pleural effusions with bibasilar atelectasis. Areas of nonenhancing consolidation within the right lower lobe likely reflect pneumonia. Musculoskeletal: Bilateral gynecomastia. No acute or suspicious osseous findings. Degenerative changes of the thoracic spine. CT ABDOMEN PELVIS FINDINGS Hepatobiliary: No focal liver abnormality is seen. No gallstones, gallbladder wall thickening, or biliary dilatation. Pancreas: Unremarkable. No pancreatic ductal dilatation or surrounding inflammatory changes. Spleen: Normal in size without focal abnormality. Adrenals/Urinary Tract: Adrenal glands are unremarkable. Kidneys are normal, without renal calculi, focal lesion, or hydronephrosis. Bladder is distended and mildly thick-walled. Stomach/Bowel: Enteric tube with the tip in the gastric body. The stomach is within normal limits. No bowel wall  thickening, obstruction, or surrounding inflammatory changes. Oral contrast within the colon. Normal appendix. Vascular/Lymphatic: Aortic atherosclerosis. No enlarged abdominal or pelvic lymph nodes. Reproductive: Markedly enlarged prostate gland, indenting the bladder base. Other: No free fluid or pneumoperitoneum. Musculoskeletal: No acute or suspicious osseous findings. Severe degenerative changes of the lumbar spine are similar to prior study. IMPRESSION: CT chest: 1. Scattered small cavitary nodules in both lungs with tree-in-bud nodularity in the right lung apex, suspicious for multifocal cavitary pneumonia. Mycobacterial organisms could cause both cavitary pneumonia and tree-in-bud nodularity, although the tree-in-bud nodularity could reflect atypical infection superimposed on cavitary pneumonia. Given the cavitary nodules, septic emboli could have a similar appearance. 2. Additional nonenhancing consolidation within the right lower also likely reflects pneumonia. 3. Moderate to large bilateral pleural effusions with bibasilar atelectasis. Secretions within the right mainstem bronchus and lower lobe bronchi also contribute to decreased aeration in the right lower lobe. 4. Diffuse interlobular septal thickening with areas of ground-glass density in the left greater than right upper lobes, likely reflecting mild alveolar and interstitial pulmonary edema. 5.  Aortic atherosclerosis (ICD10-I70.0). CT abdomen and pelvis: 1.  No acute intra-abdominal process. 2. Markedly enlarged prostate gland with evidence of chronic bladder outlet obstruction. Electronically Signed   By: Titus Dubin M.D.   On: 06/14/2017  16:59        Scheduled Meds: . chlorhexidine  15 mL Mouth Rinse BID  . enoxaparin (LOVENOX) injection  40 mg Subcutaneous Q24H  . feeding supplement (OSMOLITE 1.2 CAL)  1,000 mL Per Tube Q24H  . free water  50 mL Per Tube Q4H  . furosemide  80 mg Intravenous Q8H  . insulin glargine  10 Units  Subcutaneous Daily  . levalbuterol  0.63 mg Nebulization TID  . mouth rinse  15 mL Mouth Rinse q12n4p  . metoprolol tartrate  5 mg Intravenous Q6H  . scopolamine  1 patch Transdermal Q72H  . sodium chloride flush  10-40 mL Intracatheter Q12H   Continuous Infusions: . famotidine (PEPCID) IV Stopped (06/16/17 1108)  . piperacillin-tazobactam (ZOSYN)  IV Stopped (06/16/17 0953)  . sodium chloride    . vancomycin 1,000 mg (06/16/17 1251)     LOS: 12 days        Aline August, MD [facility] Pager [phone]  If 7PM-7AM, please contact night-coverage [email or web address] Password [email or web address] 06/16/2017, 1:20 PM

## 2017-06-17 LAB — CBC WITH DIFFERENTIAL/PLATELET
Basophils Absolute: 0 10*3/uL (ref 0.0–0.1)
Basophils Relative: 0 %
Eosinophils Absolute: 0.1 10*3/uL (ref 0.0–0.7)
Eosinophils Relative: 1 %
HCT: 34.1 % — ABNORMAL LOW (ref 39.0–52.0)
HEMOGLOBIN: 10.9 g/dL — AB (ref 13.0–17.0)
LYMPHS ABS: 2.5 10*3/uL (ref 0.7–4.0)
LYMPHS PCT: 12 %
MCH: 22.9 pg — ABNORMAL LOW (ref 26.0–34.0)
MCHC: 32 g/dL (ref 30.0–36.0)
MCV: 71.6 fL — AB (ref 78.0–100.0)
Monocytes Absolute: 0.5 10*3/uL (ref 0.1–1.0)
Monocytes Relative: 2 %
NEUTROS PCT: 85 %
Neutro Abs: 17.2 10*3/uL — ABNORMAL HIGH (ref 1.7–7.7)
Platelets: 285 10*3/uL (ref 150–400)
RBC: 4.76 MIL/uL (ref 4.22–5.81)
RDW: 16.6 % — ABNORMAL HIGH (ref 11.5–15.5)
WBC: 20.3 10*3/uL — AB (ref 4.0–10.5)

## 2017-06-17 LAB — BASIC METABOLIC PANEL
Anion gap: 9 (ref 5–15)
BUN: 35 mg/dL — ABNORMAL HIGH (ref 6–20)
CHLORIDE: 89 mmol/L — AB (ref 101–111)
CO2: 42 mmol/L — ABNORMAL HIGH (ref 22–32)
Calcium: 8.2 mg/dL — ABNORMAL LOW (ref 8.9–10.3)
Creatinine, Ser: 1.17 mg/dL (ref 0.61–1.24)
GFR calc Af Amer: >60 mL/min (ref >60)
GFR calc non Af Amer: 52 mL/min — ABNORMAL LOW (ref >60)
GLUCOSE: 168 mg/dL — AB (ref 65–99)
POTASSIUM: 3.4 mmol/L — AB (ref 3.5–5.1)
Sodium: 140 mmol/L (ref 135–145)

## 2017-06-17 LAB — GLUCOSE, CAPILLARY
GLUCOSE-CAPILLARY: 177 mg/dL — AB (ref 65–99)
GLUCOSE-CAPILLARY: 182 mg/dL — AB (ref 65–99)
GLUCOSE-CAPILLARY: 158 mg/dL — AB (ref 65–99)
Glucose-Capillary: 160 mg/dL — ABNORMAL HIGH (ref 65–99)

## 2017-06-17 LAB — MAGNESIUM: Magnesium: 2.3 mg/dL (ref 1.7–2.4)

## 2017-06-17 MED ORDER — FUROSEMIDE 10 MG/ML IJ SOLN
40.0000 mg | Freq: Two times a day (BID) | INTRAMUSCULAR | Status: DC
  Administered 2017-06-17 – 2017-06-19 (×4): 40 mg via INTRAVENOUS
  Filled 2017-06-17 (×4): qty 4

## 2017-06-17 NOTE — Progress Notes (Signed)
Patient ID: Douglas Mckee, male   DOB: 1922/07/03, 84 y.o.   MRN: [id number]  PROGRESS NOTE    Douglas Mckee  PPJ:[id number] DOB: 1922/12/25 DOA: 06/04/2017 PCP: Verline Lema, MD   Brief Narrative:  84 year old male with history of BPH, hypertension, gout, previous TIA, dementia presented to the ER with several days of progressively worsening dyspnea along with vomiting.  He was found to be febrile, tachycardic with elevated lactic acid, leukocytosis chest x-ray showed bilateral airspace disease.  He was started on intravenous antibiotics.  Critical care was consulted. Patient was initially admitted in stepdown unit. PCCM has signed off. Patient was transferred to floor. Overall condition not improving. Palliative care was consulted.  Assessment & Plan:   Principal Problem:   Sepsis due to pneumonia [facility]) Active Problems:   Metabolic encephalopathy   BPH (benign prostatic hyperplasia)   Alzheimer's dementia   Demand ischemia (HCC)   Transaminitis   Acute respiratory failure with hypoxemia (HCC)   Pneumonia of both lungs due to infectious organism   Goals of care, counseling/discussion   Palliative care encounter   SOB (shortness of breath)   Pressure injury of skin   Essential hypertension    1. Sepsis ? Resolved.  Hemodynamically stable.  Antibiotics restarted on 06/14/2017  2. Acute resp failure with hypoxia ? Initially required BiPAP.  Currently on nasal cannula.  Wean off oxygen as able. Patient with slow improvement. PCCM was reconsulted.   ? Family desires to continue aggressive medical care, however no aggressive measures such as intubation.  Patient continues to be DNR. ? Off Solu-Medrol.   3. Bilateral pneumonia ? Patient had already been treated with Zosyn for 7 days and antibiotics were discontinued ? CT scan of the chest done on 06/14/2017 showed multifocal cavitary pneumonia with concerns for atypical organisms versus septic emboli as well along with moderate to large  bilateral pleural effusions.  Patient was restarted on vancomycin and Zosyn on 06/14/2017.  Discussed with family members multiple times that patient should be made hospice/comfort measures.  They are still not decided yet. PCCM reconsulted, will follow recommendations.  We will hold off on thoracentesis for now ? Continue Lasix but decrease dose to 40 mg IV every 12 hours as BUN is rising. ? We will repeat chest x-ray with decubitus films tomorrow 4. Transaminitis with Cholelithiasis ? LFTs improved.  Doubt that the patient would be a surgical candidate 5. Metabolic encephalopathy ? Mental status not improving  6.  Hypertension ? Blood pressure improving.  Continue IV Lasix and PRN IV antihypertensives 7. Hyperglycemia ? Probably reactive from steroid use.  Monitor.  Continue Lantus. 8.  Generalized deconditioning      -Overall prognosis is guarded to poor.  Palliative care team was following.  Patient's family members do not want any further input from palliative care team at the current time.  Patient's condition is not improving, family should consider making him hospice/comfort measures.  Family members not yet ready to make that decision.   9.  Leukocytosis: Improving.  Continue current antibiotics.  Overall prognosis is very poor as mentioned above  10.  Hypernatremia: Improved.   11.  Coagulase-negative bacteremia: 1 out of 2 sets positive.  Probably contaminant.  Repeat culture from 06/16/2017 is negative so far.  DVT prophylaxis: Lovenox Code Status: DNR Family Communication: Discussed with daughter at bedside Disposition Plan: Depends on clinical outcome  Consultants: PCCM Palliative care  Procedures: None  Antimicrobials: Zosyn from 06/04/2017-06/11/2017 Zosyn and vancomycin from 06/14/2017 onwards  Subjective: Patient seen and examined at bedside.  He is sleepy, does not wake up much and does not answer any questions.  Spoke to daughter at bedside.  No overnight fever or  vomiting.    Objective: Vitals:   06/16/17 2126 06/16/17 2152 06/17/17 0416 06/17/17 0955  BP: (!) 119/56  (!) 109/59   Pulse: 89  99   Resp: 18  16   Temp: 99 F (37.2 C)  99.5 F (37.5 C)   TempSrc: Axillary  Axillary   SpO2: 99% 99% 99% 99%  Weight:   71 kg (156 lb 8.4 oz)   Height:        Intake/Output Summary (Last 24 hours) at 06/17/2017 1139 Last data filed at 06/17/2017 0616 Gross per 24 hour  Intake 1445 ml  Output 2900 ml  Net -1455 ml   Filed Weights   06/15/17 0435 06/16/17 0419 06/17/17 0416  Weight: 70.6 kg (155 lb 10.3 oz) 71 kg (156 lb 8.4 oz) 71 kg (156 lb 8.4 oz)    Examination:  General exam: Elderly male lying in bed.  Does not wake up on calling his name.  NG tube present respiratory system: Bilateral decreased breath sound at bases with scattered crackles Cardiovascular system: S1 & S2 heard, rate controlled Gastrointestinal system: Abdomen is nondistended, soft and nontender. Normal bowel sounds heard. Extremities: No cyanosis, clubbing; trace edema  Data Reviewed: I have personally reviewed following labs and imaging studies  CBC: Recent Labs  Lab 06/13/17 0530 06/14/17 0640 06/15/17 0550 06/16/17 0430 06/17/17 0500  WBC 33.9* 36.1* 31.2* 24.4* 20.3*  NEUTROABS  --   --  28.4* 21.3* 17.2*  HGB 11.3* 11.4* 10.4* 11.0* 10.9*  HCT 35.1* 35.3* 32.2* 34.0* 34.1*  MCV 71.5* 71.0* 71.1* 70.4* 71.6*  PLT 206 216 204 237 182   Basic Metabolic Panel: Recent Labs  Lab 06/10/17 1820 06/11/17 1048  06/13/17 0530 06/14/17 0640 06/15/17 0550 06/16/17 0430 06/17/17 0500  NA 148* 149*   < > 149* 147* 142 139 140  K 3.4* 3.2*   < > 3.9 4.1 3.8 3.4* 3.4*  CL 113* 117*   < > 110 104 98* 90* 89*  CO2 29 28   < > 34* 37* 37* 41* 42*  GLUCOSE 137* 223*   < > 195* 153* 193* 142* 168*  BUN 32* 38*   < > 37* 34* 33* 31* 35*  CREATININE 0.88 0.84   < > 0.86 0.84 0.93 1.06 1.17  CALCIUM 8.3* 8.3*   < > 8.3* 8.1* 8.0* 8.0* 8.2*  MG 2.1 2.0   < > 2.1  2.0 2.0 2.1 2.3  PHOS 2.1* 2.2*  --   --   --   --   --   --    < > = values in this interval not displayed.   GFR: Estimated Creatinine Clearance: 35 mL/min (by C-G formula based on SCr of 1.17 mg/dL). Liver Function Tests: Recent Labs  Lab 06/12/17 0451 06/14/17 0640 06/15/17 0550  AST 26 48* 26  ALT 35 43 34  ALKPHOS 87 90 79  BILITOT 1.1 0.7 0.7  PROT 5.4* 4.9* 4.7*  ALBUMIN 2.4* 2.2* 2.0*   No results for input(s): LIPASE, AMYLASE in the last 168 hours. No results for input(s): AMMONIA in the last 168 hours. Coagulation Profile: No results for input(s): INR, PROTIME in the last 168 hours. Cardiac Enzymes: Recent Labs  Lab 06/10/17 1820  TROPONINI 0.03*   BNP (last 3 results)  No results for input(s): PROBNP in the last 8760 hours. HbA1C: No results for input(s): HGBA1C in the last 72 hours. CBG: Recent Labs  Lab 06/16/17 0729 06/16/17 1133 06/16/17 1709 06/16/17 2121 06/17/17 0727  GLUCAP 168* 152* 150* 152* 160*   Lipid Profile: No results for input(s): CHOL, HDL, LDLCALC, TRIG, CHOLHDL, LDLDIRECT in the last 72 hours. Thyroid Function Tests: No results for input(s): TSH, T4TOTAL, FREET4, T3FREE, THYROIDAB in the last 72 hours. Anemia Panel: No results for input(s): VITAMINB12, FOLATE, FERRITIN, TIBC, IRON, RETICCTPCT in the last 72 hours. Sepsis Labs: No results for input(s): PROCALCITON, LATICACIDVEN in the last 168 hours.  Recent Results (from the past 240 hour(s))  Culture, blood (routine x 2)     Status: Abnormal   Collection Time: 06/13/17 10:19 AM  Result Value Ref Range Status   Specimen Description BLOOD LEFT HAND  Final   Special Requests IN PEDIATRIC BOTTLE Blood Culture adequate volume  Final   Culture  Setup Time   Final    GRAM POSITIVE COCCI AEROBIC BOTTLE ONLY CRITICAL RESULT CALLED TO, READ BACK BY AND VERIFIED WITH: EBurman Foster.D. 11:15 06/14/17 ([email or web address])    Culture (A)  Final    STAPHYLOCOCCUS SPECIES (COAGULASE  NEGATIVE) THE SIGNIFICANCE OF ISOLATING THIS ORGANISM FROM A SINGLE SET OF BLOOD CULTURES WHEN MULTIPLE SETS ARE DRAWN IS UNCERTAIN. PLEASE NOTIFY THE MICROBIOLOGY DEPARTMENT WITHIN ONE WEEK IF SPECIATION AND SENSITIVITIES ARE REQUIRED. Performed at [facility], [place] [address], [place], [place] [address]    Report Status 06/16/2017 FINAL  Final  Culture, blood (routine x 2)     Status: None (Preliminary result)   Collection Time: 06/13/17 10:19 AM  Result Value Ref Range Status   Specimen Description BLOOD LEFT HAND  Final   Special Requests IN PEDIATRIC BOTTLE Blood Culture adequate volume  Final   Culture   Final    NO GROWTH 3 DAYS Performed at [facility], [place] [address], [place], [place] [address]    Report Status PENDING  Incomplete  Blood Culture ID Panel (Reflexed)     Status: Abnormal   Collection Time: 06/13/17 10:19 AM  Result Value Ref Range Status   Enterococcus species NOT DETECTED NOT DETECTED Final   Listeria monocytogenes NOT DETECTED NOT DETECTED Final   Staphylococcus species DETECTED (A) NOT DETECTED Final    Comment: Methicillin (oxacillin) susceptible coagulase negative staphylococcus. Possible blood culture contaminant (unless isolated from more than one blood culture draw or clinical case suggests pathogenicity). No antibiotic treatment is indicated for blood  culture contaminants. CRITICAL RESULT CALLED TO, READ BACK BY AND VERIFIED WITH: EBurman Foster.D. 11:15 06/14/17 ([email or web address])    Staphylococcus aureus NOT DETECTED NOT DETECTED Final   Methicillin resistance NOT DETECTED NOT DETECTED Final   Streptococcus species NOT DETECTED NOT DETECTED Final   Streptococcus agalactiae NOT DETECTED NOT DETECTED Final   Streptococcus pneumoniae NOT DETECTED NOT DETECTED Final   Streptococcus pyogenes NOT DETECTED NOT DETECTED Final   Acinetobacter baumannii NOT DETECTED NOT DETECTED Final   Enterobacteriaceae species NOT DETECTED NOT DETECTED Final    Enterobacter cloacae complex NOT DETECTED NOT DETECTED Final   Escherichia coli NOT DETECTED NOT DETECTED Final   Klebsiella oxytoca NOT DETECTED NOT DETECTED Final   Klebsiella pneumoniae NOT DETECTED NOT DETECTED Final   Proteus species NOT DETECTED NOT DETECTED Final   Serratia marcescens NOT DETECTED NOT DETECTED Final   Carbapenem resistance NOT DETECTED NOT DETECTED Final   Haemophilus influenzae  NOT DETECTED NOT DETECTED Final   Neisseria meningitidis NOT DETECTED NOT DETECTED Final   Pseudomonas aeruginosa NOT DETECTED NOT DETECTED Final   Candida albicans NOT DETECTED NOT DETECTED Final   Candida glabrata NOT DETECTED NOT DETECTED Final   Candida krusei NOT DETECTED NOT DETECTED Final   Candida parapsilosis NOT DETECTED NOT DETECTED Final   Candida tropicalis NOT DETECTED NOT DETECTED Final         Radiology Studies: No results found.      Scheduled Meds: . chlorhexidine  15 mL Mouth Rinse BID  . enoxaparin (LOVENOX) injection  40 mg Subcutaneous Q24H  . feeding supplement (OSMOLITE 1.2 CAL)  1,000 mL Per Tube Q24H  . free water  50 mL Per Tube Q4H  . furosemide  40 mg Intravenous Q12H  . insulin glargine  10 Units Subcutaneous Daily  . levalbuterol  0.63 mg Nebulization TID  . mouth rinse  15 mL Mouth Rinse q12n4p  . metoprolol tartrate  5 mg Intravenous Q6H  . scopolamine  1 patch Transdermal Q72H  . sodium chloride flush  10-40 mL Intracatheter Q12H   Continuous Infusions: . famotidine (PEPCID) IV Stopped (06/17/17 1030)  . piperacillin-tazobactam (ZOSYN)  IV Stopped (06/17/17 1000)  . sodium chloride    . vancomycin Stopped (06/16/17 1351)     LOS: 13 days        Aline August, MD [facility] Pager [phone]  If 7PM-7AM, please contact night-coverage [email or web address] Password [facility] 06/17/2017, 11:39 AM

## 2017-06-17 NOTE — Progress Notes (Signed)
Pharmacy Antibiotic Note  Douglas Mckee is a 84 y.o. male admitted on 06/04/2017 with worsening dyspnea and vomiting. Patient completed course of antibiotics earlier this admission for sepsis secondary to pneumonia. With persistent leukocytosis, MD placed orders for Vancomycin and Zosyn. Imaging of chest, abd/pelvis, and head are pending. Aerobic bottle of 1 set of blood cultures from 1/16 + for Staph species. Pharmacy has been consulted for Vancomycin and Zosyn dosing.  Today, 06/17/2017: Tm 99.5 WBC 20.3, elevated but improving SCr rising to 1.17, CrCl ~ 35 ml/min  Plan:  Continue Zosyn 3.375g IV Q8H infused over 4hrs.   Decrease to Vancomycin 750 mg IV q24h.  Plan for Vancomycin peak/trough levels at steady state.   Monitor renal function (daily SCr), cultures, clinical course.   Height: 5\' 5"  (165.1 cm) Weight: 156 lb 8.4 oz (71 kg) IBW/kg (Calculated) : 61.5  Temp (24hrs), Avg:99.3 F (37.4 C), Min:99 F (37.2 C), Max:99.5 F (37.5 C)  Recent Labs  Lab 06/13/17 0530 06/14/17 0640 06/15/17 0550 06/16/17 0430 06/17/17 0500  WBC 33.9* 36.1* 31.2* 24.4* 20.3*  CREATININE 0.86 0.84 0.93 1.06 1.17    Estimated Creatinine Clearance: 35 mL/min (by C-G formula based on SCr of 1.17 mg/dL).    No Known Allergies  Antimicrobials this admission: 1/7 azithromycin x 1 1/7 ceftriaxone x 1 1/7 Flagyl x 1 1/7 Zosyn>>1/14, resumed 1/17 >> 1/8 vancomycin >> 1/9, resumed 1/17 >>  Dose adjustments this admission: 1/9 0645 RVL = 7 mcg/mL (only received vancomycin 1gm 1/8 at 0729) 1/20 Vancomycin decreased due to rising SCr.  Microbiology results: 1/7BCx: NGF 1/7 UCx:NGF 1/8 BCx: NGF 1/8 MRSA PCR: neg 1/8 Strep Ag: neg 1/8 resp panel: neg 1/16 BCx: Only 1 set = Coagulase neg staph.       1/17   BCID = Staph species  Thank you for allowing pharmacy to be a part of this patient's care.   Gretta Arab PharmD, BCPS Pager [phone] 06/17/2017 12:43 PM

## 2017-06-17 NOTE — Progress Notes (Signed)
Pt alert and smiling at times. Family at bedside. Pt incontinent of stool, large yellow soft. Condom cath used. Mouth care done q 4hr. Lubricant applied to lips. Turned and repositioned q 3 hr. O2 @ 2L via [place].

## 2017-06-18 ENCOUNTER — Inpatient Hospital Stay (HOSPITAL_COMMUNITY): Payer: Medicare Other

## 2017-06-18 LAB — GLUCOSE, CAPILLARY
GLUCOSE-CAPILLARY: 89 mg/dL (ref 65–99)
Glucose-Capillary: 183 mg/dL — ABNORMAL HIGH (ref 65–99)
Glucose-Capillary: 153 mg/dL — ABNORMAL HIGH (ref 65–99)

## 2017-06-18 LAB — CBC WITH DIFFERENTIAL/PLATELET
BASOS ABS: 0 10*3/uL (ref 0.0–0.1)
Basophils Relative: 0 %
EOS PCT: 1 %
Eosinophils Absolute: 0.2 10*3/uL (ref 0.0–0.7)
HEMATOCRIT: 33.7 % — AB (ref 39.0–52.0)
Hemoglobin: 10.7 g/dL — ABNORMAL LOW (ref 13.0–17.0)
LYMPHS ABS: 2.4 10*3/uL (ref 0.7–4.0)
Lymphocytes Relative: 12 %
MCH: 23 pg — AB (ref 26.0–34.0)
MCHC: 31.8 g/dL (ref 30.0–36.0)
MCV: 72.5 fL — AB (ref 78.0–100.0)
MONO ABS: 0.4 10*3/uL (ref 0.1–1.0)
MONOS PCT: 2 %
NEUTROS ABS: 17.4 10*3/uL — AB (ref 1.7–7.7)
Neutrophils Relative %: 85 %
PLATELETS: 323 10*3/uL (ref 150–400)
RBC: 4.65 MIL/uL (ref 4.22–5.81)
RDW: 16.8 % — ABNORMAL HIGH (ref 11.5–15.5)
WBC: 20.4 10*3/uL — ABNORMAL HIGH (ref 4.0–10.5)

## 2017-06-18 LAB — BASIC METABOLIC PANEL
ANION GAP: 11 (ref 5–15)
BUN: 39 mg/dL — AB (ref 6–20)
CO2: 41 mmol/L — AB (ref 22–32)
Calcium: 8.5 mg/dL — ABNORMAL LOW (ref 8.9–10.3)
Chloride: 91 mmol/L — ABNORMAL LOW (ref 101–111)
Creatinine, Ser: 1.17 mg/dL (ref 0.61–1.24)
GFR calc Af Amer: >60 mL/min (ref >60)
GFR calc non Af Amer: 52 mL/min — ABNORMAL LOW (ref >60)
GLUCOSE: 119 mg/dL — AB (ref 65–99)
Potassium: 3.5 mmol/L (ref 3.5–5.1)
Sodium: 143 mmol/L (ref 135–145)

## 2017-06-18 LAB — CULTURE, BLOOD (ROUTINE X 2)
CULTURE: NO GROWTH
SPECIAL REQUESTS: ADEQUATE

## 2017-06-18 LAB — MAGNESIUM: Magnesium: 2.4 mg/dL (ref 1.7–2.4)

## 2017-06-18 MED ORDER — DEXTROSE-NACL 5-0.45 % IV SOLN
INTRAVENOUS | Status: DC
  Administered 2017-06-18: 1000 mL via INTRAVENOUS
  Administered 2017-06-22 – 2017-06-24 (×4): via INTRAVENOUS

## 2017-06-18 MED ORDER — LEVALBUTEROL HCL 0.63 MG/3ML IN NEBU: 0.6300 mg | INHALATION_SOLUTION | Freq: Four times a day (QID) | RESPIRATORY_TRACT | Status: DC | PRN

## 2017-06-18 NOTE — Progress Notes (Signed)
Pts. scheduled 2100 aerosol was not given by RT covering floor due to >'d acuity in ED, PRN order established for pt.

## 2017-06-18 NOTE — Progress Notes (Signed)
Patient ID: Douglas Mckee, male   DOB: 07/10/1922, 84 y.o.   MRN: [id number]  PROGRESS NOTE    Donoven Pett  XTG:[id number] DOB: 18-Sep-1922 DOA: 06/04/2017 PCP: Verline Lema, MD   Brief Narrative:  84 year old male with history of BPH, hypertension, gout, previous TIA, dementia presented to the ER with several days of progressively worsening dyspnea along with vomiting.  He was found to be febrile, tachycardic with elevated lactic acid, leukocytosis chest x-ray showed bilateral airspace disease.  He was started on intravenous antibiotics.  Critical care was consulted. Patient was initially admitted in stepdown unit. PCCM has signed off. Patient was transferred to floor. Overall condition not improving. Palliative care was consulted.  Assessment & Plan:   Principal Problem:   Sepsis due to pneumonia [facility]) Active Problems:   Metabolic encephalopathy   BPH (benign prostatic hyperplasia)   Alzheimer's dementia   Demand ischemia (HCC)   Transaminitis   Acute respiratory failure with hypoxemia (HCC)   Pneumonia of both lungs due to infectious organism   Goals of care, counseling/discussion   Palliative care encounter   SOB (shortness of breath)   Pressure injury of skin   Essential hypertension    1. Sepsis ? Resolved.  Hemodynamically stable.  Antibiotics restarted on 06/14/2017  2. Acute resp failure with hypoxia ? Initially required BiPAP.  Currently on nasal cannula.  Wean off oxygen as able. Patient with slow improvement. PCCM was reconsulted.   ? Family desires to continue aggressive medical care, however no aggressive measures such as intubation.  Patient continues to be DNR. ? Off Solu-Medrol.   3. Bilateral pneumonia ? Patient had already been treated with Zosyn for 7 days and antibiotics were discontinued ? CT scan of the chest done on 06/14/2017 showed multifocal cavitary pneumonia with concerns for atypical organisms versus septic emboli as well along with moderate to large  bilateral pleural effusions.  Patient was restarted on vancomycin and Zosyn on 06/14/2017.  Discussed with family members multiple times that patient should be made hospice/comfort measures.  They are still not decided yet. PCCM reconsulted, will follow recommendations.  We will hold off on thoracentesis for now ? Continue Lasix at current doses.  ? Chest x-ray from today including decreased but his films shows very small layering pleural effusions ? Vancomycin was discontinued on 06/17/2017 as there is no evidence of MRSA infection. Continue Zosyn for now. Patient continues to have NG tube feeding which probably is making matters worse as well. Family wants to continue NG feeding for now. Will hold feeding and consult IR to advance the tube. 4. Transaminitis with Cholelithiasis ? LFTs improved.  Doubt that the patient would be a surgical candidate 5. Metabolic encephalopathy ? Mental status not improving  6.  Hypertension ? Blood pressure improving.  Continue IV Lasix and PRN IV antihypertensives 7. Hyperglycemia ? Probably reactive from steroid use.  Monitor.  Continue Lantus. 8.  Generalized deconditioning      -Overall prognosis is guarded to poor.  Palliative care team was following.  Patient's family members do not want any further input from palliative care team at the current time.  Patient's condition is not improving, family should consider making him hospice/comfort measures.  Family members not yet ready to make that decision.  Will also consult Ethics committee. 9.  Leukocytosis: White count almost same as yesterday.  Continue current antibiotics.  Overall prognosis is very poor as mentioned above  10.  Hypernatremia: Improved.   11.  Coagulase-negative bacteremia: 1  out of 2 sets positive.  Probably contaminant.  Repeat culture from 06/16/2017 is negative so far.  DVT prophylaxis: Lovenox Code Status: DNR Family Communication: Discussed with daughter at bedside Disposition Plan:  Depends on clinical outcome  Consultants: PCCM Palliative care  Procedures: None  Antimicrobials: Zosyn from 06/04/2017-06/11/2017 Zosyn and vancomycin from 06/14/2017 onwards  Subjective: Patient seen and examined at bedside.  He is sleepy,  wakes up slightly and does not answer any questions.  Spoke to daughter at bedside.  No overnight fever or vomiting.    Objective: Vitals:   06/17/17 2036 06/18/17 0022 06/18/17 0514 06/18/17 0818  BP:  (!) 118/52 (!) 122/56   Pulse:  97 (!) 102   Resp:   18   Temp:   98.6 F (37 C)   TempSrc:   Axillary   SpO2: 97%  100% 95%  Weight:   59.2 kg (130 lb 8 oz)   Height:        Intake/Output Summary (Last 24 hours) at 06/18/2017 1128 Last data filed at 06/18/2017 0816 Gross per 24 hour  Intake 1061.83 ml  Output 350 ml  Net 711.83 ml   Filed Weights   06/16/17 0419 06/17/17 0416 06/18/17 0514  Weight: 71 kg (156 lb 8.4 oz) 71 kg (156 lb 8.4 oz) 59.2 kg (130 lb 8 oz)    Examination:  General exam: Elderly male lying in bed. Wakes up slightly but doesn't answer any questions. NG tube present. No distress respiratory system: Bilateral decreased breath sound at bases with scattered crackles Cardiovascular system: S1 & S2 heard, rate controlled Gastrointestinal system: Abdomen is nondistended, soft and nontender. Normal bowel sounds heard. Extremities: No cyanosis, clubbing; trace edema  Data Reviewed: I have personally reviewed following labs and imaging studies  CBC: Recent Labs  Lab 06/14/17 0640 06/15/17 0550 06/16/17 0430 06/17/17 0500 06/18/17 0537  WBC 36.1* 31.2* 24.4* 20.3* 20.4*  NEUTROABS  --  28.4* 21.3* 17.2* 17.4*  HGB 11.4* 10.4* 11.0* 10.9* 10.7*  HCT 35.3* 32.2* 34.0* 34.1* 33.7*  MCV 71.0* 71.1* 70.4* 71.6* 72.5*  PLT 216 204 237 285 412   Basic Metabolic Panel: Recent Labs  Lab 06/14/17 0640 06/15/17 0550 06/16/17 0430 06/17/17 0500 06/18/17 0537  NA 147* 142 139 140 143  K 4.1 3.8 3.4* 3.4* 3.5  CL  104 98* 90* 89* 91*  CO2 37* 37* 41* 42* 41*  GLUCOSE 153* 193* 142* 168* 119*  BUN 34* 33* 31* 35* 39*  CREATININE 0.84 0.93 1.06 1.17 1.17  CALCIUM 8.1* 8.0* 8.0* 8.2* 8.5*  MG 2.0 2.0 2.1 2.3 2.4   GFR: Estimated Creatinine Clearance: 33.7 mL/min (by C-G formula based on SCr of 1.17 mg/dL). Liver Function Tests: Recent Labs  Lab 06/12/17 0451 06/14/17 0640 06/15/17 0550  AST 26 48* 26  ALT 35 43 34  ALKPHOS 87 90 79  BILITOT 1.1 0.7 0.7  PROT 5.4* 4.9* 4.7*  ALBUMIN 2.4* 2.2* 2.0*   No results for input(s): LIPASE, AMYLASE in the last 168 hours. No results for input(s): AMMONIA in the last 168 hours. Coagulation Profile: No results for input(s): INR, PROTIME in the last 168 hours. Cardiac Enzymes: No results for input(s): CKTOTAL, CKMB, CKMBINDEX, TROPONINI in the last 168 hours. BNP (last 3 results) No results for input(s): PROBNP in the last 8760 hours. HbA1C: No results for input(s): HGBA1C in the last 72 hours. CBG: Recent Labs  Lab 06/17/17 0727 06/17/17 1138 06/17/17 1631 06/17/17 2021 06/18/17 0815  GLUCAP  160* 177* 182* 158* 183*   Lipid Profile: No results for input(s): CHOL, HDL, LDLCALC, TRIG, CHOLHDL, LDLDIRECT in the last 72 hours. Thyroid Function Tests: No results for input(s): TSH, T4TOTAL, FREET4, T3FREE, THYROIDAB in the last 72 hours. Anemia Panel: No results for input(s): VITAMINB12, FOLATE, FERRITIN, TIBC, IRON, RETICCTPCT in the last 72 hours. Sepsis Labs: No results for input(s): PROCALCITON, LATICACIDVEN in the last 168 hours.  Recent Results (from the past 240 hour(s))  Culture, blood (routine x 2)     Status: Abnormal   Collection Time: 06/13/17 10:19 AM  Result Value Ref Range Status   Specimen Description BLOOD LEFT HAND  Final   Special Requests IN PEDIATRIC BOTTLE Blood Culture adequate volume  Final   Culture  Setup Time   Final    GRAM POSITIVE COCCI AEROBIC BOTTLE ONLY CRITICAL RESULT CALLED TO, READ BACK BY AND VERIFIED  WITH: EBurman Foster.D. 11:15 06/14/17 ([email or web address])    Culture (A)  Final    STAPHYLOCOCCUS SPECIES (COAGULASE NEGATIVE) THE SIGNIFICANCE OF ISOLATING THIS ORGANISM FROM A SINGLE SET OF BLOOD CULTURES WHEN MULTIPLE SETS ARE DRAWN IS UNCERTAIN. PLEASE NOTIFY THE MICROBIOLOGY DEPARTMENT WITHIN ONE WEEK IF SPECIATION AND SENSITIVITIES ARE REQUIRED. Performed at [facility], [place] [address], [place], [place] [address]    Report Status 06/16/2017 FINAL  Final  Culture, blood (routine x 2)     Status: None (Preliminary result)   Collection Time: 06/13/17 10:19 AM  Result Value Ref Range Status   Specimen Description BLOOD LEFT HAND  Final   Special Requests IN PEDIATRIC BOTTLE Blood Culture adequate volume  Final   Culture   Final    NO GROWTH 4 DAYS Performed at [facility], [place] [address], [place], [place] [address]    Report Status PENDING  Incomplete  Blood Culture ID Panel (Reflexed)     Status: Abnormal   Collection Time: 06/13/17 10:19 AM  Result Value Ref Range Status   Enterococcus species NOT DETECTED NOT DETECTED Final   Listeria monocytogenes NOT DETECTED NOT DETECTED Final   Staphylococcus species DETECTED (A) NOT DETECTED Final    Comment: Methicillin (oxacillin) susceptible coagulase negative staphylococcus. Possible blood culture contaminant (unless isolated from more than one blood culture draw or clinical case suggests pathogenicity). No antibiotic treatment is indicated for blood  culture contaminants. CRITICAL RESULT CALLED TO, READ BACK BY AND VERIFIED WITH: EBurman Foster.D. 11:15 06/14/17 ([email or web address])    Staphylococcus aureus NOT DETECTED NOT DETECTED Final   Methicillin resistance NOT DETECTED NOT DETECTED Final   Streptococcus species NOT DETECTED NOT DETECTED Final   Streptococcus agalactiae NOT DETECTED NOT DETECTED Final   Streptococcus pneumoniae NOT DETECTED NOT DETECTED Final   Streptococcus pyogenes NOT DETECTED NOT DETECTED Final   Acinetobacter  baumannii NOT DETECTED NOT DETECTED Final   Enterobacteriaceae species NOT DETECTED NOT DETECTED Final   Enterobacter cloacae complex NOT DETECTED NOT DETECTED Final   Escherichia coli NOT DETECTED NOT DETECTED Final   Klebsiella oxytoca NOT DETECTED NOT DETECTED Final   Klebsiella pneumoniae NOT DETECTED NOT DETECTED Final   Proteus species NOT DETECTED NOT DETECTED Final   Serratia marcescens NOT DETECTED NOT DETECTED Final   Carbapenem resistance NOT DETECTED NOT DETECTED Final   Haemophilus influenzae NOT DETECTED NOT DETECTED Final   Neisseria meningitidis NOT DETECTED NOT DETECTED Final   Pseudomonas aeruginosa NOT DETECTED NOT DETECTED Final   Candida albicans NOT DETECTED NOT DETECTED Final   Candida glabrata NOT DETECTED  NOT DETECTED Final   Candida krusei NOT DETECTED NOT DETECTED Final   Candida parapsilosis NOT DETECTED NOT DETECTED Final   Candida tropicalis NOT DETECTED NOT DETECTED Final  Culture, blood (routine x 2)     Status: None (Preliminary result)   Collection Time: 06/16/17  8:00 AM  Result Value Ref Range Status   Specimen Description BLOOD BLOOD RIGHT FOREARM  Final   Special Requests IN PEDIATRIC BOTTLE Blood Culture adequate volume  Final   Culture   Final    NO GROWTH 1 DAY Performed at [facility], [place] [address] [place], [place] [address]    Report Status PENDING  Incomplete  Culture, blood (routine x 2)     Status: None (Preliminary result)   Collection Time: 06/16/17  8:12 AM  Result Value Ref Range Status   Specimen Description BLOOD BLOOD LEFT HAND  Final   Special Requests IN PEDIATRIC BOTTLE Blood Culture adequate volume  Final   Culture   Final    NO GROWTH 1 DAY Performed at [facility], [place] [address] [place], [place] [address]    Report Status PENDING  Incomplete         Radiology Studies: Dg Chest 2 View  Result Date: 06/18/2017 CLINICAL DATA:  84 year old male with dyspnea EXAM: CHEST  2 VIEW COMPARISON:  Most  recent prior chest x-ray 06/12/2017 FINDINGS: A weighted tip enteric feeding tube is present. The tip of the tube overlies the gastric fundus. Right upper extremity approach PICC is well position with the tip overlying the superior cavoatrial junction. Stable cardiac and mediastinal contours. Atherosclerotic calcifications present in the transverse aorta. Small bilateral pleural effusions with associated bibasilar atelectasis versus infiltrate. Pleuroparenchymal scarring is present in the periphery of the left upper lobe. Chronic background bronchitic changes are stable. No acute osseous abnormality. IMPRESSION: 1. The tip of the enteric feeding tube projects over the gastric antrum. Recommend advancing if post pyloric feeding is desired. 2. Well-positioned PICC. 3. Improved pulmonary aeration compared to 06/12/2017 likely secondary to decreased interstitial edema. 4. Persistent small bilateral pleural effusions and associated bibasilar atelectasis. Electronically Signed   By: Jacqulynn Cadet M.D.   On: 06/18/2017 09:59   Dg Chest Bilateral Decubitus  Result Date: 06/18/2017 CLINICAL DATA:  Shortness of breath. EXAM: CHEST - BILATERAL DECUBITUS VIEW COMPARISON:  Chest CT 06/14/2017.  Chest x-ray 06/18/2017. FINDINGS: Very small layering bilateral effusions noted on decubitus views, likely not sufficient to tap. IMPRESSION: Suspect very small layering bilateral effusions. Electronically Signed   By: Rolm Baptise M.D.   On: 06/18/2017 09:58        Scheduled Meds: . chlorhexidine  15 mL Mouth Rinse BID  . enoxaparin (LOVENOX) injection  40 mg Subcutaneous Q24H  . feeding supplement (OSMOLITE 1.2 CAL)  1,000 mL Per Tube Q24H  . free water  50 mL Per Tube Q4H  . furosemide  40 mg Intravenous Q12H  . insulin glargine  10 Units Subcutaneous Daily  . levalbuterol  0.63 mg Nebulization TID  . mouth rinse  15 mL Mouth Rinse q12n4p  . metoprolol tartrate  5 mg Intravenous Q6H  . scopolamine  1 patch  Transdermal Q72H  . sodium chloride flush  10-40 mL Intracatheter Q12H   Continuous Infusions: . famotidine (PEPCID) IV Stopped (06/17/17 2229)  . piperacillin-tazobactam (ZOSYN)  IV 3.375 g (06/18/17 0514)  . sodium chloride       LOS: 14 days  Aline August, MD [facility] Pager [phone]  If 7PM-7AM, please contact night-coverage [email or web address] Password [facility] 06/18/2017, 11:28 AM

## 2017-06-18 NOTE — Progress Notes (Signed)
PULMONARY / CRITICAL CARE MEDICINE   Name: Douglas Mckee MRN: [id number] DOB: Aug 11, 1922    ADMISSION DATE:  06/04/2017 CONSULTATION DATE:  06/15/2017  REFERRING MD:  Doyle Askew  CHIEF COMPLAINT: failure to improve from PNA, CHF  HISTORY OF PRESENT ILLNESS:   84 year old man with a history of dementia and a complicated hospitalization that has included aspiration pneumonia in the setting of emesis that seemed to be initiated by gallbladder disease.  He has been weak and had recurrent aspiration and waxing and waning hypoxemia and respiratory status.  Course complicated by cardiogenic pulmonary edema, bilateral moderate pleural effusions.  His sepsis has resolved but he still is on high flow oxygen.  His family hopes to see improvement with aggressive medical care.  He is DNR should he decompensate.  We were asked to see him again 1/18 given his failure to improve.  A CT scan of his chest was done on 06/14/16 that I reviewed.  This shows some scattered cystic change, superimposed scattered groundglass infiltrate consistent with pneumonia at various degrees of resolution, bilateral moderate pleural effusions.  Do not see any overt thick-walled cavitary lesions that would suggest to me an embolic process. A TTE was ordered to assess for vegetations.   SUBJECTIVE:  Family at bedside.  RN reports NGT needs to be advanced per KUB.  No acute events.   VITAL SIGNS: BP (!) 113/50 (BP Location: Left Arm)   Pulse 91   Temp 98.9 F (37.2 C) (Oral)   Resp 16   Ht 5\' 5"  (1.651 m)   Wt 130 lb 8 oz (59.2 kg)   SpO2 95%   BMI 21.72 kg/m   HEMODYNAMICS:    VENTILATOR SETTINGS:    INTAKE / OUTPUT: I/O last 3 completed shifts: In: 1666.8 [NG/GT:1416.8; IV Piggyback:250] Out: 1800 [Urine:1800]  PHYSICAL EXAMINATION: General: cachectic elderly male in NAD HEENT: MM pink/moist, edentulous  Neuro: awake, no response when asking questions (daughter states he only answers when he wants to ??), tracks /  aphasic  CV: s1s2 rrr, no m/r/g PULM: even/non-labored, lungs bilaterally clear anterior, few basilar crackles  ZH:YQMV, non-tender, bsx4 active  Extremities: warm/dry, no edema  Skin: no rashes or lesions  LABS:  BMET Recent Labs  Lab 06/16/17 0430 06/17/17 0500 06/18/17 0537  NA 139 140 143  K 3.4* 3.4* 3.5  CL 90* 89* 91*  CO2 41* 42* 41*  BUN 31* 35* 39*  CREATININE 1.06 1.17 1.17  GLUCOSE 142* 168* 119*    Electrolytes Recent Labs  Lab 06/16/17 0430 06/17/17 0500 06/18/17 0537  CALCIUM 8.0* 8.2* 8.5*  MG 2.1 2.3 2.4    CBC Recent Labs  Lab 06/16/17 0430 06/17/17 0500 06/18/17 0537  WBC 24.4* 20.3* 20.4*  HGB 11.0* 10.9* 10.7*  HCT 34.0* 34.1* 33.7*  PLT 237 285 323    Coag's No results for input(s): APTT, INR in the last 168 hours.  Sepsis Markers No results for input(s): LATICACIDVEN, PROCALCITON, O2SATVEN in the last 168 hours.  ABG No results for input(s): PHART, PCO2ART, PO2ART in the last 168 hours.  Liver Enzymes Recent Labs  Lab 06/12/17 0451 06/14/17 0640 06/15/17 0550  AST 26 48* 26  ALT 35 43 34  ALKPHOS 87 90 79  BILITOT 1.1 0.7 0.7  ALBUMIN 2.4* 2.2* 2.0*    Cardiac Enzymes No results for input(s): TROPONINI, PROBNP in the last 168 hours.  Glucose Recent Labs  Lab 06/17/17 0727 06/17/17 1138 06/17/17 1631 06/17/17 2021 06/18/17 0815 06/18/17 1210  GLUCAP 160* 177* 182* 158* 183* 153*    Imaging Dg Chest 2 View  Result Date: 06/18/2017 CLINICAL DATA:  84 year old male with dyspnea EXAM: CHEST  2 VIEW COMPARISON:  Most recent prior chest x-ray 06/12/2017 FINDINGS: A weighted tip enteric feeding tube is present. The tip of the tube overlies the gastric fundus. Right upper extremity approach PICC is well position with the tip overlying the superior cavoatrial junction. Stable cardiac and mediastinal contours. Atherosclerotic calcifications present in the transverse aorta. Small bilateral pleural effusions with  associated bibasilar atelectasis versus infiltrate. Pleuroparenchymal scarring is present in the periphery of the left upper lobe. Chronic background bronchitic changes are stable. No acute osseous abnormality. IMPRESSION: 1. The tip of the enteric feeding tube projects over the gastric antrum. Recommend advancing if post pyloric feeding is desired. 2. Well-positioned PICC. 3. Improved pulmonary aeration compared to 06/12/2017 likely secondary to decreased interstitial edema. 4. Persistent small bilateral pleural effusions and associated bibasilar atelectasis. Electronically Signed   By: Jacqulynn Cadet M.D.   On: 06/18/2017 09:59   Dg Chest Bilateral Decubitus  Result Date: 06/18/2017 CLINICAL DATA:  Shortness of breath. EXAM: CHEST - BILATERAL DECUBITUS VIEW COMPARISON:  Chest CT 06/14/2017.  Chest x-ray 06/18/2017. FINDINGS: Very small layering bilateral effusions noted on decubitus views, likely not sufficient to tap. IMPRESSION: Suspect very small layering bilateral effusions. Electronically Signed   By: Rolm Baptise M.D.   On: 06/18/2017 09:58     STUDIES:  1/7 RUQ ultrasound> gallbladder full of stones, difficult to visualize wall of GB, CBD normal diameter  CULTURES: 1/7 Blood > negative 1/7 Urine > negative 1/16 blood >> staph species >>  ANTIBIOTICS: 1/7 azithro > 1/7 1/7 ceftriaxone > 1/7 1/7 flagyl > 1/7 1/7 zosyn > ?? 1/17 zosyn >>  1/17 vanco >>   DISCUSSION: 84 y/o male with a complicated long course, characterized by recurrent aspiration and recurrent bouts of PNA +/- pneumonitis. His overall improvement has stalled due to recurrent aspiration insults, deconditioning, pleural effusions. Suspect that the effusions are due to malnutrition, dCHF. His CT chest shows scattered infiltrates superimposed on cystic changes. Not clear that there is an embolic or cavitary process here. Previously discussed the pros / cons of thoracentesis. No role for this currently as CXR has  cleared.   Family aware of the possibility that he isn't going to improve because aspiration is the underlying problem. We broached the subject of possibly acknowledging this and just letting him eat, accepting the consequence that he will get pneumonia, won't live as long. They understand that we may get to that point during this hospitalization.   Plan: ABX per primary  Nutrition per NGT  Aspiration precautions Diuresis as renal function / BP permit  SLP efforts  Defer thoracentesis / no fluid on CXR  Continue to discuss goals of care, especially if he does not show signs of improvement.   PCCM will sign off.  Please call back if new needs arise.    Noe Gens, NP-C [facility] Pgr: [phone] or if no answer [phone] 06/18/2017, 4:11 PM

## 2017-06-18 NOTE — Progress Notes (Signed)
Panda tube clamped because x-ray showed it needed to be advanced. ICU called to see if they could advance it and they stated if the wire is removed it can't be advanced. IR needs to be called in the AM and have them schedule him to have it replaced on 1/22. IV fluids changed to D5 1/2 @60 .

## 2017-06-19 ENCOUNTER — Inpatient Hospital Stay (HOSPITAL_COMMUNITY): Payer: Medicare Other

## 2017-06-19 DIAGNOSIS — J90 Pleural effusion, not elsewhere classified: Secondary | ICD-10-CM

## 2017-06-19 LAB — BASIC METABOLIC PANEL
ANION GAP: 8 (ref 5–15)
BUN: 41 mg/dL — AB (ref 6–20)
CALCIUM: 8.5 mg/dL — AB (ref 8.9–10.3)
CO2: 41 mmol/L — ABNORMAL HIGH (ref 22–32)
Chloride: 94 mmol/L — ABNORMAL LOW (ref 101–111)
Creatinine, Ser: 1.15 mg/dL (ref 0.61–1.24)
GFR calc Af Amer: >60 mL/min (ref >60)
GFR, EST NON AFRICAN AMERICAN: 53 mL/min — AB (ref >60)
GLUCOSE: 103 mg/dL — AB (ref 65–99)
Potassium: 3.4 mmol/L — ABNORMAL LOW (ref 3.5–5.1)
SODIUM: 143 mmol/L (ref 135–145)

## 2017-06-19 LAB — GLUCOSE, CAPILLARY
GLUCOSE-CAPILLARY: 111 mg/dL — AB (ref 65–99)
GLUCOSE-CAPILLARY: 190 mg/dL — AB (ref 65–99)
Glucose-Capillary: 111 mg/dL — ABNORMAL HIGH (ref 65–99)
Glucose-Capillary: 217 mg/dL — ABNORMAL HIGH (ref 65–99)
Glucose-Capillary: 94 mg/dL (ref 65–99)

## 2017-06-19 LAB — CBC WITH DIFFERENTIAL/PLATELET
BASOS ABS: 0 10*3/uL (ref 0.0–0.1)
Basophils Relative: 0 %
EOS ABS: 0.1 10*3/uL (ref 0.0–0.7)
EOS PCT: 1 %
HCT: 33.2 % — ABNORMAL LOW (ref 39.0–52.0)
Hemoglobin: 10.4 g/dL — ABNORMAL LOW (ref 13.0–17.0)
LYMPHS PCT: 8 %
Lymphs Abs: 1.5 10*3/uL (ref 0.7–4.0)
MCH: 23 pg — AB (ref 26.0–34.0)
MCHC: 31.3 g/dL (ref 30.0–36.0)
MCV: 73.3 fL — AB (ref 78.0–100.0)
MONO ABS: 1.5 10*3/uL — AB (ref 0.1–1.0)
Monocytes Relative: 8 %
Neutro Abs: 15.1 10*3/uL — ABNORMAL HIGH (ref 1.7–7.7)
Neutrophils Relative %: 83 %
PLATELETS: 365 10*3/uL (ref 150–400)
RBC: 4.53 MIL/uL (ref 4.22–5.81)
RDW: 17.2 % — AB (ref 11.5–15.5)
WBC: 18.3 10*3/uL — AB (ref 4.0–10.5)

## 2017-06-19 LAB — MAGNESIUM: MAGNESIUM: 2.5 mg/dL — AB (ref 1.7–2.4)

## 2017-06-19 MED ORDER — IOPAMIDOL (ISOVUE-300) INJECTION 61%
INTRAVENOUS | Status: AC
  Filled 2017-06-19: qty 50

## 2017-06-19 MED ORDER — LIDOCAINE HCL 2 % EX GEL
CUTANEOUS | Status: AC
  Filled 2017-06-19: qty 30

## 2017-06-19 NOTE — Consult Note (Signed)
   [facility] CM Inpatient Consult   06/19/2017  Douglas Mckee Sep 26, 1922 [id number]    Patient screened for potential [facility] Care Management services due to extended length of stay.   Chart reviewed. Noted Primary Care MD is not listed as a [facility] Provider.   Therefore, will not attempt to follow for [facility] Care Management services at this time.   Discussed previously with inpatient RNCM.     Marthenia Rolling, MSN-Ed, RN,BSN [facility] Liaison [phone]

## 2017-06-19 NOTE — Progress Notes (Signed)
Per hospitalists, Panda tube ok for usage and orders given to restart tube feeding at 55/hr

## 2017-06-19 NOTE — Progress Notes (Signed)
Patient ID: Douglas Mckee, male   DOB: May 10, 1923, 84 y.o.   MRN: [id number]  PROGRESS NOTE    Douglas Mckee  XUX:[id number] DOB: 11/20/1922 DOA: 06/04/2017 PCP: Verline Lema, MD   Brief Narrative:  84 year old male with history of BPH, hypertension, gout, previous TIA, dementia presented to the ER with several days of progressively worsening dyspnea along with vomiting.  He was found to be febrile, tachycardic with elevated lactic acid, leukocytosis chest x-ray showed bilateral airspace disease.  He was started on intravenous antibiotics.  Critical care was consulted. Patient was initially admitted in stepdown unit. PCCM has signed off. Patient was transferred to floor. Overall condition not improving. Palliative care was consulted.  Assessment & Plan:   Principal Problem:   Sepsis due to pneumonia [facility]) Active Problems:   Metabolic encephalopathy   BPH (benign prostatic hyperplasia)   Alzheimer's dementia   Demand ischemia (HCC)   Transaminitis   Acute respiratory failure with hypoxemia (HCC)   Pneumonia of both lungs due to infectious organism   Goals of care, counseling/discussion   Palliative care encounter   SOB (shortness of breath)   Pressure injury of skin   Essential hypertension    1. Sepsis ? Resolved.  Hemodynamically stable.  Antibiotics restarted on 06/14/2017  2. Acute resp failure with hypoxia ? Initially required BiPAP.  Currently on nasal cannula.  Wean off oxygen as able. Patient with slow improvement. PCCM was reconsulted.   ? Family desires to continue aggressive medical care, however no aggressive measures such as intubation.  Patient continues to be DNR. ? Off Solu-Medrol.   3. Bilateral pneumonia ? Patient had already been treated with Zosyn for 7 days and antibiotics were discontinued ? CT scan of the chest done on 06/14/2017 showed multifocal cavitary pneumonia with concerns for atypical organisms versus septic emboli as well along with moderate to large  bilateral pleural effusions.  Patient was restarted on vancomycin and Zosyn on 06/14/2017.  Discussed with family members multiple times that patient should be made hospice/comfort measures.  They are still not decided yet. PCCM reconsulted and they have signed off again.   ? Discontinue Lasix today as patient is in negative balance of 2043 cc since admission ? Chest x-ray from yesterday showed very small layering pleural effusions ? Vancomycin was discontinued on 06/17/2017 as there is no evidence of MRSA infection. Continue Zosyn for now and discontinue after tomorrow's doses. Patient continues to have NG tube feeding which probably is making matters worse as well. Family wants to continue NG feeding for now.  IR has been consulted to advance the feeding tube as per recommendations from radiology. 4. Transaminitis with Cholelithiasis  ? LFTs improved.  Doubt that the patient would be a surgical candidate 5. Metabolic encephalopathy ? Mental status not improving  6.  Hypertension ? Blood pressure improving.  Continue PRN IV antihypertensives 7. Hyperglycemia ? Probably reactive from steroid use.  Improved.  Discontinue Lantus 8.  Generalized deconditioning      -Overall prognosis is guarded to poor.  Palliative care team was following.  Patient's family members do not want any further input from palliative care team at the current time.  Patient's condition is not improving, family should consider making him hospice/comfort measures.  Family members not yet ready to make that decision.  I think we have reached the point where further medical care is probably futile. Futures trader. 9.  Leukocytosis: Improving.  Continue current antibiotics.  Overall prognosis is very poor as  mentioned above  10.  Hypernatremia: Improved.   11.  Coagulase-negative bacteremia: 1 out of 2 sets positive.  Probably contaminant.  Repeat culture from 06/16/2017 is negative so far.  DVT prophylaxis:  Lovenox Code Status: DNR Family Communication: Discussed with daughter and grandson at bedside Disposition Plan: Depends on clinical outcome  Consultants: PCCM Palliative care  Procedures: None  Antimicrobials: Zosyn from 06/04/2017-06/11/2017; 06/14/2017 onwards vancomycin 06/14/2017-06/17/2017  Subjective: Patient seen and examined at bedside.  He is sleepy, does not wake up. Spoke to daughter and son at bedside.  No overnight fever or vomiting.    Objective: Vitals:   06/18/17 2140 06/19/17 0030 06/19/17 0605 06/19/17 1033  BP: (!) 143/57 112/64 (!) 148/72   Pulse: 92 88 (!) 104   Resp: 20  16   Temp: 99.3 F (37.4 C)  99.1 F (37.3 C)   TempSrc: Axillary Axillary Axillary   SpO2: 99%  98% 99%  Weight:      Height:        Intake/Output Summary (Last 24 hours) at 06/19/2017 1128 Last data filed at 06/19/2017 0630 Gross per 24 hour  Intake 10 ml  Output 650 ml  Net -640 ml   Filed Weights   06/16/17 0419 06/17/17 0416 06/18/17 0514  Weight: 71 kg (156 lb 8.4 oz) 71 kg (156 lb 8.4 oz) 59.2 kg (130 lb 8 oz)    Examination:  General exam: Elderly male lying in bed.  Does not wake up.  NG tube present. No distress respiratory system: Bilateral decreased breath sound at bases with scattered crackles Cardiovascular system: S1 & S2 heard, rate controlled Gastrointestinal system: Abdomen is nondistended, soft and nontender. Normal bowel sounds heard. Extremities: No cyanosis, clubbing; trace edema  Data Reviewed: I have personally reviewed following labs and imaging studies  CBC: Recent Labs  Lab 06/15/17 0550 06/16/17 0430 06/17/17 0500 06/18/17 0537 06/19/17 0640  WBC 31.2* 24.4* 20.3* 20.4* 18.3*  NEUTROABS 28.4* 21.3* 17.2* 17.4* 15.1*  HGB 10.4* 11.0* 10.9* 10.7* 10.4*  HCT 32.2* 34.0* 34.1* 33.7* 33.2*  MCV 71.1* 70.4* 71.6* 72.5* 73.3*  PLT 204 237 285 323 660   Basic Metabolic Panel: Recent Labs  Lab 06/15/17 0550 06/16/17 0430 06/17/17 0500  06/18/17 0537 06/19/17 0640  NA 142 139 140 143 143  K 3.8 3.4* 3.4* 3.5 3.4*  CL 98* 90* 89* 91* 94*  CO2 37* 41* 42* 41* 41*  GLUCOSE 193* 142* 168* 119* 103*  BUN 33* 31* 35* 39* 41*  CREATININE 0.93 1.06 1.17 1.17 1.15  CALCIUM 8.0* 8.0* 8.2* 8.5* 8.5*  MG 2.0 2.1 2.3 2.4 2.5*   GFR: Estimated Creatinine Clearance: 34.3 mL/min (by C-G formula based on SCr of 1.15 mg/dL). Liver Function Tests: Recent Labs  Lab 06/14/17 0640 06/15/17 0550  AST 48* 26  ALT 43 34  ALKPHOS 90 79  BILITOT 0.7 0.7  PROT 4.9* 4.7*  ALBUMIN 2.2* 2.0*   No results for input(s): LIPASE, AMYLASE in the last 168 hours. No results for input(s): AMMONIA in the last 168 hours. Coagulation Profile: No results for input(s): INR, PROTIME in the last 168 hours. Cardiac Enzymes: No results for input(s): CKTOTAL, CKMB, CKMBINDEX, TROPONINI in the last 168 hours. BNP (last 3 results) No results for input(s): PROBNP in the last 8760 hours. HbA1C: No results for input(s): HGBA1C in the last 72 hours. CBG: Recent Labs  Lab 06/18/17 0815 06/18/17 1210 06/18/17 1655 06/19/17 0048 06/19/17 1011  GLUCAP 183* 153* 89 94 111*  Lipid Profile: No results for input(s): CHOL, HDL, LDLCALC, TRIG, CHOLHDL, LDLDIRECT in the last 72 hours. Thyroid Function Tests: No results for input(s): TSH, T4TOTAL, FREET4, T3FREE, THYROIDAB in the last 72 hours. Anemia Panel: No results for input(s): VITAMINB12, FOLATE, FERRITIN, TIBC, IRON, RETICCTPCT in the last 72 hours. Sepsis Labs: No results for input(s): PROCALCITON, LATICACIDVEN in the last 168 hours.  Recent Results (from the past 240 hour(s))  Culture, blood (routine x 2)     Status: Abnormal   Collection Time: 06/13/17 10:19 AM  Result Value Ref Range Status   Specimen Description BLOOD LEFT HAND  Final   Special Requests IN PEDIATRIC BOTTLE Blood Culture adequate volume  Final   Culture  Setup Time   Final    GRAM POSITIVE COCCI AEROBIC BOTTLE  ONLY CRITICAL RESULT CALLED TO, READ BACK BY AND VERIFIED WITH: EBurman Foster.D. 11:15 06/14/17 ([email or web address])    Culture (A)  Final    STAPHYLOCOCCUS SPECIES (COAGULASE NEGATIVE) THE SIGNIFICANCE OF ISOLATING THIS ORGANISM FROM A SINGLE SET OF BLOOD CULTURES WHEN MULTIPLE SETS ARE DRAWN IS UNCERTAIN. PLEASE NOTIFY THE MICROBIOLOGY DEPARTMENT WITHIN ONE WEEK IF SPECIATION AND SENSITIVITIES ARE REQUIRED. Performed at [facility], [place] [address] [place], [place] [address]    Report Status 06/16/2017 FINAL  Final  Culture, blood (routine x 2)     Status: None   Collection Time: 06/13/17 10:19 AM  Result Value Ref Range Status   Specimen Description BLOOD LEFT HAND  Final   Special Requests IN PEDIATRIC BOTTLE Blood Culture adequate volume  Final   Culture   Final    NO GROWTH 5 DAYS Performed at [facility], [place] [address] [place], [place] [address]    Report Status 06/18/2017 FINAL  Final  Blood Culture ID Panel (Reflexed)     Status: Abnormal   Collection Time: 06/13/17 10:19 AM  Result Value Ref Range Status   Enterococcus species NOT DETECTED NOT DETECTED Final   Listeria monocytogenes NOT DETECTED NOT DETECTED Final   Staphylococcus species DETECTED (A) NOT DETECTED Final    Comment: Methicillin (oxacillin) susceptible coagulase negative staphylococcus. Possible blood culture contaminant (unless isolated from more than one blood culture draw or clinical case suggests pathogenicity). No antibiotic treatment is indicated for blood  culture contaminants. CRITICAL RESULT CALLED TO, READ BACK BY AND VERIFIED WITH: EBurman Foster.D. 11:15 06/14/17 ([email or web address])    Staphylococcus aureus NOT DETECTED NOT DETECTED Final   Methicillin resistance NOT DETECTED NOT DETECTED Final   Streptococcus species NOT DETECTED NOT DETECTED Final   Streptococcus agalactiae NOT DETECTED NOT DETECTED Final   Streptococcus pneumoniae NOT DETECTED NOT DETECTED Final   Streptococcus pyogenes NOT  DETECTED NOT DETECTED Final   Acinetobacter baumannii NOT DETECTED NOT DETECTED Final   Enterobacteriaceae species NOT DETECTED NOT DETECTED Final   Enterobacter cloacae complex NOT DETECTED NOT DETECTED Final   Escherichia coli NOT DETECTED NOT DETECTED Final   Klebsiella oxytoca NOT DETECTED NOT DETECTED Final   Klebsiella pneumoniae NOT DETECTED NOT DETECTED Final   Proteus species NOT DETECTED NOT DETECTED Final   Serratia marcescens NOT DETECTED NOT DETECTED Final   Carbapenem resistance NOT DETECTED NOT DETECTED Final   Haemophilus influenzae NOT DETECTED NOT DETECTED Final   Neisseria meningitidis NOT DETECTED NOT DETECTED Final   Pseudomonas aeruginosa NOT DETECTED NOT DETECTED Final   Candida albicans NOT DETECTED NOT DETECTED Final   Candida glabrata NOT DETECTED NOT DETECTED Final   Candida krusei NOT  DETECTED NOT DETECTED Final   Candida parapsilosis NOT DETECTED NOT DETECTED Final   Candida tropicalis NOT DETECTED NOT DETECTED Final  Culture, blood (routine x 2)     Status: None (Preliminary result)   Collection Time: 06/16/17  8:00 AM  Result Value Ref Range Status   Specimen Description BLOOD BLOOD RIGHT FOREARM  Final   Special Requests IN PEDIATRIC BOTTLE Blood Culture adequate volume  Final   Culture   Final    NO GROWTH 3 DAYS Performed at [facility], [place] [address] [place], [place] [address]    Report Status PENDING  Incomplete  Culture, blood (routine x 2)     Status: None (Preliminary result)   Collection Time: 06/16/17  8:12 AM  Result Value Ref Range Status   Specimen Description BLOOD BLOOD LEFT HAND  Final   Special Requests IN PEDIATRIC BOTTLE Blood Culture adequate volume  Final   Culture   Final    NO GROWTH 3 DAYS Performed at [facility], [place] [address] [place], [place] [address]    Report Status PENDING  Incomplete         Radiology Studies: Dg Chest 2 View  Result Date: 06/18/2017 CLINICAL DATA:  84 year old male with  dyspnea EXAM: CHEST  2 VIEW COMPARISON:  Most recent prior chest x-ray 06/12/2017 FINDINGS: A weighted tip enteric feeding tube is present. The tip of the tube overlies the gastric fundus. Right upper extremity approach PICC is well position with the tip overlying the superior cavoatrial junction. Stable cardiac and mediastinal contours. Atherosclerotic calcifications present in the transverse aorta. Small bilateral pleural effusions with associated bibasilar atelectasis versus infiltrate. Pleuroparenchymal scarring is present in the periphery of the left upper lobe. Chronic background bronchitic changes are stable. No acute osseous abnormality. IMPRESSION: 1. The tip of the enteric feeding tube projects over the gastric antrum. Recommend advancing if post pyloric feeding is desired. 2. Well-positioned PICC. 3. Improved pulmonary aeration compared to 06/12/2017 likely secondary to decreased interstitial edema. 4. Persistent small bilateral pleural effusions and associated bibasilar atelectasis. Electronically Signed   By: Jacqulynn Cadet M.D.   On: 06/18/2017 09:59   Dg Chest Bilateral Decubitus  Result Date: 06/18/2017 CLINICAL DATA:  Shortness of breath. EXAM: CHEST - BILATERAL DECUBITUS VIEW COMPARISON:  Chest CT 06/14/2017.  Chest x-ray 06/18/2017. FINDINGS: Very small layering bilateral effusions noted on decubitus views, likely not sufficient to tap. IMPRESSION: Suspect very small layering bilateral effusions. Electronically Signed   By: Rolm Baptise M.D.   On: 06/18/2017 09:58        Scheduled Meds: . chlorhexidine  15 mL Mouth Rinse BID  . enoxaparin (LOVENOX) injection  40 mg Subcutaneous Q24H  . feeding supplement (OSMOLITE 1.2 CAL)  1,000 mL Per Tube Q24H  . free water  50 mL Per Tube Q4H  . furosemide  40 mg Intravenous Q12H  . insulin glargine  10 Units Subcutaneous Daily  . levalbuterol  0.63 mg Nebulization TID  . mouth rinse  15 mL Mouth Rinse q12n4p  . metoprolol tartrate  5 mg  Intravenous Q6H  . scopolamine  1 patch Transdermal Q72H  . sodium chloride flush  10-40 mL Intracatheter Q12H   Continuous Infusions: . dextrose 5 % and 0.45% NaCl 1,000 mL (06/18/17 1754)  . famotidine (PEPCID) IV 20 mg (06/19/17 1121)  . piperacillin-tazobactam (ZOSYN)  IV 3.375 g (06/19/17 0603)  . sodium chloride       LOS: 15 days  Aline August, MD [facility] Pager [phone]  If 7PM-7AM, please contact night-coverage [email or web address] Password [facility] 06/19/2017, 11:28 AM

## 2017-06-19 NOTE — Care Management Important Message (Signed)
Important Message  Patient Details  Name: Douglas Mckee MRN: [id number] Date of Birth: 1923/01/21   Medicare Important Message Given:  Yes    Kerin Salen 06/19/2017, 11:37 AMImportant Message  Patient Details  Name: Douglas Mckee MRN: [id number] Date of Birth: 1922/11/26   Medicare Important Message Given:  Yes    Kerin Salen 06/19/2017, 11:37 AM

## 2017-06-20 LAB — BASIC METABOLIC PANEL
ANION GAP: 6 (ref 5–15)
BUN: 39 mg/dL — AB (ref 6–20)
CHLORIDE: 95 mmol/L — AB (ref 101–111)
CO2: 41 mmol/L — AB (ref 22–32)
Calcium: 7.9 mg/dL — ABNORMAL LOW (ref 8.9–10.3)
Creatinine, Ser: 1.05 mg/dL (ref 0.61–1.24)
GFR calc Af Amer: >60 mL/min (ref >60)
GFR calc non Af Amer: 59 mL/min — ABNORMAL LOW (ref >60)
GLUCOSE: 294 mg/dL — AB (ref 65–99)
POTASSIUM: 3.1 mmol/L — AB (ref 3.5–5.1)
Sodium: 142 mmol/L (ref 135–145)

## 2017-06-20 LAB — CBC WITH DIFFERENTIAL/PLATELET
BASOS ABS: 0 10*3/uL (ref 0.0–0.1)
Basophils Relative: 0 %
EOS PCT: 1 %
Eosinophils Absolute: 0.1 10*3/uL (ref 0.0–0.7)
HEMATOCRIT: 28.9 % — AB (ref 39.0–52.0)
HEMOGLOBIN: 9 g/dL — AB (ref 13.0–17.0)
LYMPHS ABS: 1.1 10*3/uL (ref 0.7–4.0)
LYMPHS PCT: 8 %
MCH: 23 pg — ABNORMAL LOW (ref 26.0–34.0)
MCHC: 31.1 g/dL (ref 30.0–36.0)
MCV: 73.7 fL — AB (ref 78.0–100.0)
Monocytes Absolute: 1.1 10*3/uL — ABNORMAL HIGH (ref 0.1–1.0)
Monocytes Relative: 8 %
NEUTROS ABS: 11.4 10*3/uL — AB (ref 1.7–7.7)
Neutrophils Relative %: 83 %
PLATELETS: 344 10*3/uL (ref 150–400)
RBC: 3.92 MIL/uL — AB (ref 4.22–5.81)
RDW: 17.1 % — ABNORMAL HIGH (ref 11.5–15.5)
WBC: 13.7 10*3/uL — AB (ref 4.0–10.5)

## 2017-06-20 LAB — GLUCOSE, CAPILLARY
GLUCOSE-CAPILLARY: 145 mg/dL — AB (ref 65–99)
GLUCOSE-CAPILLARY: 133 mg/dL — AB (ref 65–99)
GLUCOSE-CAPILLARY: 188 mg/dL — AB (ref 65–99)
Glucose-Capillary: 176 mg/dL — ABNORMAL HIGH (ref 65–99)

## 2017-06-20 LAB — MAGNESIUM: Magnesium: 2.3 mg/dL (ref 1.7–2.4)

## 2017-06-20 MED ORDER — POTASSIUM CHLORIDE 10 MEQ/100ML IV SOLN
10.0000 meq | INTRAVENOUS | Status: AC
  Administered 2017-06-20 (×5): 10 meq via INTRAVENOUS
  Filled 2017-06-20 (×5): qty 100

## 2017-06-20 MED ORDER — LEVALBUTEROL HCL 0.63 MG/3ML IN NEBU
0.6300 mg | INHALATION_SOLUTION | Freq: Two times a day (BID) | RESPIRATORY_TRACT | Status: DC
  Administered 2017-06-20 – 2017-06-25 (×11): 0.63 mg via RESPIRATORY_TRACT
  Filled 2017-06-20 (×12): qty 3

## 2017-06-20 MED ORDER — INSULIN ASPART 100 UNIT/ML ~~LOC~~ SOLN
0.0000 [IU] | Freq: Three times a day (TID) | SUBCUTANEOUS | Status: DC
  Administered 2017-06-20 (×2): 1 [IU] via SUBCUTANEOUS
  Administered 2017-06-20 – 2017-06-21 (×3): 2 [IU] via SUBCUTANEOUS
  Administered 2017-06-23 – 2017-06-25 (×5): 1 [IU] via SUBCUTANEOUS
  Administered 2017-06-25: 2 [IU] via SUBCUTANEOUS

## 2017-06-20 NOTE — Progress Notes (Signed)
PROGRESS NOTE    Douglas Mckee  HQP:[id number] DOB: Apr 07, 1923 DOA: 06/04/2017 PCP: Verline Lema, MD   Brief Narrative: Patient is a 84 year old male with past medical history of BPH, hypertension, diabetes, TIA, dementia, recurrent pneumonia who presented to the emergency department with worsening dyspnea, vomiting.  He was found to be febrile, tachycardic with elevated lactic acid and leukocytosis.  Chest x-ray showed bilateral airspace disease.  Suspected to be from aspiration pneumonia.  He was started on IV antibiotics.  Critical care was consulted.  Patient was admitted on stepdown unit.  Patient has been transferred to hospital service.  Overall condition is not improving.  Palliative care on board.   Assessment & Plan:   Principal Problem:   Sepsis due to pneumonia [facility]) Active Problems:   Metabolic encephalopathy   BPH (benign prostatic hyperplasia)   Alzheimer's dementia   Demand ischemia (HCC)   Transaminitis   Acute respiratory failure with hypoxemia (HCC)   Pneumonia of both lungs due to infectious organism   Goals of care, counseling/discussion   Palliative care encounter   SOB (shortness of breath)   Pressure injury of skin   Essential hypertension  Sepsis: Resolved.  Currently he is hemodynamically stable.  On antibiotics.  Acute respiratory failure with hypoxia: Was initially started on BiPAP.  Currently on nasal cannula.  Oxygen will be weaned off as tolerated.  Family desires to continue medical care.  Currently he is DNI/DNR.  Steroids stopped  Bilateral pneumonia: Suspected to be from aspiration.  Patient initially completed treatment with Zosyn for 7 days.  CT scan of the chest done on 06/14/17 showed multifocal cavitary pneumonia with concern for atypical organisms for septic emboli as well as moderate to large bilateral pleural effusions.  Patient was started on vancomycin injection on 06/14/17. Since he has recurrent pneumonia, we have discussed with family  members multiple times about possible hospice/comfort measures.  They have not decided yet. Daughter was present on the bedside today.  She said she will discuss with her other family members. Vancomycin discontinued on 06/17/17 as there is no evidence of MRSA infection.  Continue Zosyn for now.  Likely will be discontinued after today's dose.  Patient continues to have NG tube feeding which the family wants.  IR has advanced the feeding tube.  Transaminitis with cholelithiasis: LFTs improved.  Metabolic encephalopathy: As discussed with the family members, mental status has been improving.  Hypertension: Continue current medications.  We will continue to monitor blood pressure.  Hyperglycemia: Improving.  Discontinued Lantus.  Multiple Comorbidities, deconditioning, poor prognosis: Palliative care was following.  Patient's family members do not want any further input from palliative care team.  Family has been addressed several times about discussion on hospice/comfort measures.  Family members not ready to make that decision yet.  We consulted ethics committee.  Leukocytosis: Improving.  Hyponatremia: Improved.  Bacteremia: Previous blood cultures grew coag is negative organism.  Probably contaminant.  Repeat culture on 06/17/88 is negative so far.    DVT prophylaxis:Lovenox Code Status: DNR Family Communication: Discussed with daughter at the bedside Disposition Plan: Unknown   Consultants: PCCM, palliative care  Procedures: None  Antimicrobials: Zosyn since 1/70/19  Subjective: Patient seen and examined the bedside.  Not alert or oriented.  Looks comfortable though.  Discussed with the daughter at bedside.  Objective: Vitals:   06/20/17 0035 06/20/17 0603 06/20/17 0851 06/20/17 1417  BP: 134/69 117/66  (!) 126/54  Pulse: 87 89  92  Resp:  16  Temp:  99.4 F (37.4 C)  99.3 F (37.4 C)  TempSrc:  Axillary  Axillary  SpO2:   97% 95%  Weight:      Height:         Intake/Output Summary (Last 24 hours) at 06/20/2017 1436 Last data filed at 06/20/2017 0258 Gross per 24 hour  Intake 810 ml  Output 525 ml  Net 285 ml   Filed Weights   06/16/17 0419 06/17/17 0416 06/18/17 0514  Weight: 71 kg (156 lb 8.4 oz) 71 kg (156 lb 8.4 oz) 59.2 kg (130 lb 8 oz)    Examination:  General exam: Appears calm and comfortable ,Not in distress,elderly male ,nasogastric feeding tube  respiratory system: Bilateral decreased air entry in the bases  cardiovascular system: S1 & S2 heard, RRR. No JVD, murmurs, rubs, gallops or clicks. No pedal edema. Gastrointestinal system: Abdomen is nondistended, soft and nontender. No organomegaly or masses felt. Normal bowel sounds heard. Central nervous system: Not Alert and oriented. No focal neurological deficits. Extremities: No edema, no clubbing ,no cyanosis, distal peripheral pulses palpable. Skin: No rashes, lesions or ulcers,no icterus ,no pallor    Data Reviewed: I have personally reviewed following labs and imaging studies  CBC: Recent Labs  Lab 06/16/17 0430 06/17/17 0500 06/18/17 0537 06/19/17 0640 06/20/17 0549  WBC 24.4* 20.3* 20.4* 18.3* 13.7*  NEUTROABS 21.3* 17.2* 17.4* 15.1* 11.4*  HGB 11.0* 10.9* 10.7* 10.4* 9.0*  HCT 34.0* 34.1* 33.7* 33.2* 28.9*  MCV 70.4* 71.6* 72.5* 73.3* 73.7*  PLT 237 285 323 365 527   Basic Metabolic Panel: Recent Labs  Lab 06/16/17 0430 06/17/17 0500 06/18/17 0537 06/19/17 0640 06/20/17 0549  NA 139 140 143 143 142  K 3.4* 3.4* 3.5 3.4* 3.1*  CL 90* 89* 91* 94* 95*  CO2 41* 42* 41* 41* 41*  GLUCOSE 142* 168* 119* 103* 294*  BUN 31* 35* 39* 41* 39*  CREATININE 1.06 1.17 1.17 1.15 1.05  CALCIUM 8.0* 8.2* 8.5* 8.5* 7.9*  MG 2.1 2.3 2.4 2.5* 2.3   GFR: Estimated Creatinine Clearance: 37.6 mL/min (by C-G formula based on SCr of 1.05 mg/dL). Liver Function Tests: Recent Labs  Lab 06/14/17 0640 06/15/17 0550  AST 48* 26  ALT 43 34  ALKPHOS 90 79  BILITOT  0.7 0.7  PROT 4.9* 4.7*  ALBUMIN 2.2* 2.0*   No results for input(s): LIPASE, AMYLASE in the last 168 hours. No results for input(s): AMMONIA in the last 168 hours. Coagulation Profile: No results for input(s): INR, PROTIME in the last 168 hours. Cardiac Enzymes: No results for input(s): CKTOTAL, CKMB, CKMBINDEX, TROPONINI in the last 168 hours. BNP (last 3 results) No results for input(s): PROBNP in the last 8760 hours. HbA1C: No results for input(s): HGBA1C in the last 72 hours. CBG: Recent Labs  Lab 06/19/17 1300 06/19/17 1726 06/19/17 2152 06/20/17 0739 06/20/17 1202  GLUCAP 111* 190* 217* 176* 145*   Lipid Profile: No results for input(s): CHOL, HDL, LDLCALC, TRIG, CHOLHDL, LDLDIRECT in the last 72 hours. Thyroid Function Tests: No results for input(s): TSH, T4TOTAL, FREET4, T3FREE, THYROIDAB in the last 72 hours. Anemia Panel: No results for input(s): VITAMINB12, FOLATE, FERRITIN, TIBC, IRON, RETICCTPCT in the last 72 hours. Sepsis Labs: No results for input(s): PROCALCITON, LATICACIDVEN in the last 168 hours.  Recent Results (from the past 240 hour(s))  Culture, blood (routine x 2)     Status: Abnormal   Collection Time: 06/13/17 10:19 AM  Result Value Ref Range  Status   Specimen Description BLOOD LEFT HAND  Final   Special Requests IN PEDIATRIC BOTTLE Blood Culture adequate volume  Final   Culture  Setup Time   Final    GRAM POSITIVE COCCI AEROBIC BOTTLE ONLY CRITICAL RESULT CALLED TO, READ BACK BY AND VERIFIED WITH: EBurman Foster.D. 11:15 06/14/17 ([email or web address])    Culture (A)  Final    STAPHYLOCOCCUS SPECIES (COAGULASE NEGATIVE) THE SIGNIFICANCE OF ISOLATING THIS ORGANISM FROM A SINGLE SET OF BLOOD CULTURES WHEN MULTIPLE SETS ARE DRAWN IS UNCERTAIN. PLEASE NOTIFY THE MICROBIOLOGY DEPARTMENT WITHIN ONE WEEK IF SPECIATION AND SENSITIVITIES ARE REQUIRED. Performed at [facility], [place] [address] [place], [place] [address]    Report Status 06/16/2017 FINAL   Final  Culture, blood (routine x 2)     Status: None   Collection Time: 06/13/17 10:19 AM  Result Value Ref Range Status   Specimen Description BLOOD LEFT HAND  Final   Special Requests IN PEDIATRIC BOTTLE Blood Culture adequate volume  Final   Culture   Final    NO GROWTH 5 DAYS Performed at [facility], [place] [address], [place], [place] [address]    Report Status 06/18/2017 FINAL  Final  Blood Culture ID Panel (Reflexed)     Status: Abnormal   Collection Time: 06/13/17 10:19 AM  Result Value Ref Range Status   Enterococcus species NOT DETECTED NOT DETECTED Final   Listeria monocytogenes NOT DETECTED NOT DETECTED Final   Staphylococcus species DETECTED (A) NOT DETECTED Final    Comment: Methicillin (oxacillin) susceptible coagulase negative staphylococcus. Possible blood culture contaminant (unless isolated from more than one blood culture draw or clinical case suggests pathogenicity). No antibiotic treatment is indicated for blood  culture contaminants. CRITICAL RESULT CALLED TO, READ BACK BY AND VERIFIED WITH: EBurman Foster.D. 11:15 06/14/17 ([email or web address])    Staphylococcus aureus NOT DETECTED NOT DETECTED Final   Methicillin resistance NOT DETECTED NOT DETECTED Final   Streptococcus species NOT DETECTED NOT DETECTED Final   Streptococcus agalactiae NOT DETECTED NOT DETECTED Final   Streptococcus pneumoniae NOT DETECTED NOT DETECTED Final   Streptococcus pyogenes NOT DETECTED NOT DETECTED Final   Acinetobacter baumannii NOT DETECTED NOT DETECTED Final   Enterobacteriaceae species NOT DETECTED NOT DETECTED Final   Enterobacter cloacae complex NOT DETECTED NOT DETECTED Final   Escherichia coli NOT DETECTED NOT DETECTED Final   Klebsiella oxytoca NOT DETECTED NOT DETECTED Final   Klebsiella pneumoniae NOT DETECTED NOT DETECTED Final   Proteus species NOT DETECTED NOT DETECTED Final   Serratia marcescens NOT DETECTED NOT DETECTED Final   Carbapenem resistance NOT DETECTED NOT  DETECTED Final   Haemophilus influenzae NOT DETECTED NOT DETECTED Final   Neisseria meningitidis NOT DETECTED NOT DETECTED Final   Pseudomonas aeruginosa NOT DETECTED NOT DETECTED Final   Candida albicans NOT DETECTED NOT DETECTED Final   Candida glabrata NOT DETECTED NOT DETECTED Final   Candida krusei NOT DETECTED NOT DETECTED Final   Candida parapsilosis NOT DETECTED NOT DETECTED Final   Candida tropicalis NOT DETECTED NOT DETECTED Final  Culture, blood (routine x 2)     Status: None (Preliminary result)   Collection Time: 06/16/17  8:00 AM  Result Value Ref Range Status   Specimen Description BLOOD BLOOD RIGHT FOREARM  Final   Special Requests IN PEDIATRIC BOTTLE Blood Culture adequate volume  Final   Culture   Final    NO GROWTH 4 DAYS Performed at [facility], [address]  [place], [place] [address]    Report Status PENDING  Incomplete  Culture, blood (routine x 2)     Status: None (Preliminary result)   Collection Time: 06/16/17  8:12 AM  Result Value Ref Range Status   Specimen Description BLOOD BLOOD LEFT HAND  Final   Special Requests IN PEDIATRIC BOTTLE Blood Culture adequate volume  Final   Culture   Final    NO GROWTH 4 DAYS Performed at [facility], [place] [address], [place], [place] [address]    Report Status PENDING  Incomplete         Radiology Studies: Dg Loyce Dys Tube Plc W/fl W/rad  Result Date: 06/19/2017 CLINICAL DATA:  Aspiration, advancement of feeding to EXAM: NASO G TUBE PLACEMENT WITH FL AND WITH RAD CONTRAST:  Air-contrast FLUOROSCOPY TIME:  Fluoroscopy Time:  5.1 minutes Radiation Exposure Index (if provided by the fluoroscopic device): 65.1 mGy Number of Acquired Spot Images: 1 COMPARISON:  CT 1179 FINDINGS: Under fluoroscopic observation a guide wire was advanced through the length of the feeding tube. Feeding tube was advanced into the third portion duodenum. Guidewire was retracted. Feeding tube flushed with 60 cc water. Initially  tube was clogged with material but subsequently freed. IMPRESSION: Advancement of feeding tube to the third portion the duodenum. Electronically Signed   By: Suzy Bouchard M.D.   On: 06/19/2017 13:29        Scheduled Meds: . chlorhexidine  15 mL Mouth Rinse BID  . enoxaparin (LOVENOX) injection  40 mg Subcutaneous Q24H  . feeding supplement (OSMOLITE 1.2 CAL)  1,000 mL Per Tube Q24H  . free water  50 mL Per Tube Q4H  . insulin aspart  0-9 Units Subcutaneous TID WC  . levalbuterol  0.63 mg Nebulization BID  . mouth rinse  15 mL Mouth Rinse q12n4p  . metoprolol tartrate  5 mg Intravenous Q6H  . scopolamine  1 patch Transdermal Q72H  . sodium chloride flush  10-40 mL Intracatheter Q12H   Continuous Infusions: . dextrose 5 % and 0.45% NaCl 50 mL/hr at 06/19/17 1429  . famotidine (PEPCID) IV Stopped (06/20/17 0943)  . piperacillin-tazobactam (ZOSYN)  IV 3.375 g (06/20/17 1426)  . potassium chloride 10 mEq (06/20/17 1428)  . sodium chloride       LOS: 16 days    Time spent:25 mins    Curtis Uriarte Jodie Echevaria, MD [facility] Pager [phone]  If 7PM-7AM, please contact night-coverage [email or web address] Password [facility] 06/20/2017, 2:36 PM

## 2017-06-20 NOTE — Progress Notes (Signed)
Nutrition Follow-up  INTERVENTION:   -Continue Osmolite 1.5 @ 55 ml/hr. -This regimen will provide 1584 kcal, 73 grams of protein, and 1082 mL free water. -Continue50 mL free water every 4 hours (300 mL/day).  NUTRITION DIAGNOSIS:   Inadequate oral intake related to inability to eat as evidenced by NPO status.  Ongoing.  GOAL:   Patient will meet greater than or equal to 90% of their needs  Meeting with TF at goal rate.  MONITOR:   TF tolerance, Weight trends, Labs, Skin, GOC  ASSESSMENT:   84 year old male with history significant for BPH, hypertension, gout, previous TIA, dementia presented to the ER with several days of progressively worsening dyspnea and acutely worse after episodes of vomiting.  In the ER patient was febrile to 101.9, tachycardia, respiratory distress.  Elevated lactic acid level of 8.6, transaminitis, leukocytosis and elevated troponin level.  Chest x-ray with bilateral airspace disease.   Pt had TF restarted on 1/22, d/t tube needing to be advanced. Per IR note, tube was advanced to the third portion of the duodenum. TF was restarted at 10 ml/hr.  Pt's weight is now -37 lb since admission on 1/7.  Will continue to monitor for plan of care.  Medications: D5- .45% NaCl infusion at 50 ml/hr- provides 204 kcal, IV Zosyn every 8 hours Labs reviewed: CBGs: 176-217 Low K Mg WNL  Diet Order:  Diet NPO time specified  EDUCATION NEEDS:   No education needs have been identified at this time  Skin:  Skin Assessment: Skin Integrity Issues: Skin Integrity Issues:: Stage I Stage I: coccyx  Last BM:  1/15  Height:   Ht Readings from Last 1 Encounters:  06/04/17 5\' 5"  (1.651 m)    Weight:   Wt Readings from Last 1 Encounters:  06/18/17 130 lb 8 oz (59.2 kg)    Ideal Body Weight:  61.82 kg  BMI:  Body mass index is 21.72 kg/m.  Estimated Nutritional Needs:   Kcal:  1515-1745 (20-23 kcal/kg)  Protein:  60-70 grams  Fluid:  >/= 1.6  L/day  Clayton Bibles, MS, RD, LDN [place] [facility] Pager: [phone] After Hours Pager: [phone]

## 2017-06-21 ENCOUNTER — Inpatient Hospital Stay (HOSPITAL_COMMUNITY): Payer: Medicare Other

## 2017-06-21 LAB — CBC WITH DIFFERENTIAL/PLATELET
BASOS PCT: 0 %
Basophils Absolute: 0 10*3/uL (ref 0.0–0.1)
EOS ABS: 0.1 10*3/uL (ref 0.0–0.7)
EOS PCT: 1 %
HCT: 27.8 % — ABNORMAL LOW (ref 39.0–52.0)
Hemoglobin: 8.9 g/dL — ABNORMAL LOW (ref 13.0–17.0)
LYMPHS ABS: 1.1 10*3/uL (ref 0.7–4.0)
Lymphocytes Relative: 10 %
MCH: 23.2 pg — AB (ref 26.0–34.0)
MCHC: 32 g/dL (ref 30.0–36.0)
MCV: 72.6 fL — ABNORMAL LOW (ref 78.0–100.0)
MONO ABS: 0.7 10*3/uL (ref 0.1–1.0)
MONOS PCT: 6 %
Neutro Abs: 9.6 10*3/uL — ABNORMAL HIGH (ref 1.7–7.7)
Neutrophils Relative %: 83 %
Platelets: 327 10*3/uL (ref 150–400)
RBC: 3.83 MIL/uL — ABNORMAL LOW (ref 4.22–5.81)
RDW: 17.1 % — AB (ref 11.5–15.5)
WBC: 11.4 10*3/uL — ABNORMAL HIGH (ref 4.0–10.5)

## 2017-06-21 LAB — CULTURE, BLOOD (ROUTINE X 2)
CULTURE: NO GROWTH
Culture: NO GROWTH
SPECIAL REQUESTS: ADEQUATE
Special Requests: ADEQUATE

## 2017-06-21 LAB — GLUCOSE, CAPILLARY
GLUCOSE-CAPILLARY: 102 mg/dL — AB (ref 65–99)
GLUCOSE-CAPILLARY: 164 mg/dL — AB (ref 65–99)
Glucose-Capillary: 155 mg/dL — ABNORMAL HIGH (ref 65–99)
Glucose-Capillary: 143 mg/dL — ABNORMAL HIGH (ref 65–99)

## 2017-06-21 LAB — BASIC METABOLIC PANEL
Anion gap: 6 (ref 5–15)
BUN: 27 mg/dL — ABNORMAL HIGH (ref 6–20)
CALCIUM: 8.2 mg/dL — AB (ref 8.9–10.3)
CHLORIDE: 99 mmol/L — AB (ref 101–111)
CO2: 35 mmol/L — ABNORMAL HIGH (ref 22–32)
CREATININE: 0.77 mg/dL (ref 0.61–1.24)
GFR calc non Af Amer: >60 mL/min (ref >60)
Glucose, Bld: 156 mg/dL — ABNORMAL HIGH (ref 65–99)
Potassium: 3.4 mmol/L — ABNORMAL LOW (ref 3.5–5.1)
SODIUM: 140 mmol/L (ref 135–145)

## 2017-06-21 MED ORDER — METOPROLOL TARTRATE 5 MG/5ML IV SOLN
2.5000 mg | Freq: Once | INTRAVENOUS | Status: AC
  Administered 2017-06-21: 2.5 mg via INTRAVENOUS

## 2017-06-21 MED ORDER — METOPROLOL TARTRATE 5 MG/5ML IV SOLN
2.5000 mg | Freq: Four times a day (QID) | INTRAVENOUS | Status: DC
  Administered 2017-06-21 – 2017-06-25 (×18): 2.5 mg via INTRAVENOUS
  Filled 2017-06-21 (×18): qty 5

## 2017-06-21 MED ORDER — POTASSIUM CHLORIDE 10 MEQ/100ML IV SOLN
10.0000 meq | INTRAVENOUS | Status: AC
  Administered 2017-06-21 (×4): 10 meq via INTRAVENOUS
  Filled 2017-06-21 (×8): qty 100

## 2017-06-21 NOTE — Progress Notes (Signed)
PROGRESS NOTE    Douglas Mckee  XTK:[id number] DOB: 12-28-1922 DOA: 06/04/2017 PCP: Verline Lema, MD   Brief Narrative: Patient is a 84 year old male with past medical history of BPH, hypertension, diabetes, TIA, dementia, recurrent pneumonia who presented to the emergency department with worsening dyspnea, vomiting.  He was found to be febrile, tachycardic with elevated lactic acid and leukocytosis.  Chest x-ray showed bilateral airspace disease.  Suspected to be from aspiration pneumonia.  He was started on IV antibiotics.  Critical care was consulted.  Patient was admitted on stepdown unit.  Patient has been transferred to hospital service.  Patient has chronic issues with aspiration .He is unable to swallow and currently on tube feeding .  Palliative care on board.  Family is yet to decide upon further goals of treatment.   Assessment & Plan:   Principal Problem:   Sepsis due to pneumonia [facility]) Active Problems:   Metabolic encephalopathy   BPH (benign prostatic hyperplasia)   Alzheimer's dementia   Demand ischemia (HCC)   Transaminitis   Acute respiratory failure with hypoxemia (HCC)   Pneumonia of both lungs due to infectious organism   Goals of care, counseling/discussion   Palliative care encounter   SOB (shortness of breath)   Pressure injury of skin   Essential hypertension  Sepsis: Resolved.  Currently he is hemodynamically stable.  Antibiotics stopped today  Acute respiratory failure with hypoxia: Was initially started on BiPAP.  Currently on nasal cannula.  Oxygen will be weaned off as tolerated.  Family desires to continue medical care.  Currently he is DNI/DNR.  Steroids stopped  Bilateral pneumonia: Suspected to be from aspiration.  Patient initially completed treatment with Zosyn for 7 days.  CT scan of the chest done on 06/14/17 showed multifocal cavitary pneumonia with concern for atypical organisms for septic emboli as well as moderate to large bilateral pleural  effusions.  Patient was started on vancomycin  on 06/14/17. Since he has recurrent pneumonia, we have discussed with family members multiple times about possible hospice/comfort measures.  They have not decided yet. Daughter and grandson were present on the bedside today.  She said she will discuss with her other family members. Vancomycin discontinued on 06/17/17 as there is no evidence of MRSA infection.  Zosyn will be discontinued today.  Patient continues to have NG tube feeding which the family wants.  IR has advanced the feeding tube. I have discussed with family about option of getting PEG/G tube if he is not transitioned to comfort care/hospice .They  said there will discuss and let us know.  Transaminitis with cholelithiasis: LFTs improved.  Metabolic encephalopathy: Mental status has been improving.  Does not communicate at present.  As per the family, he was walking and talking before he was admitted.  Hypertension: Continue current medications.  We will continue to monitor blood pressure.  Hyperglycemia: Improving.  Discontinued Lantus.  Multiple Comorbidities, deconditioning, poor prognosis: Palliative care was following.  Patient's family members did not  not want any further input from palliative care team.  Family has been addressed several times about discussion on hospice/comfort measures.  Family members not ready to make that decision yet.  We consulted ethics committee. I did reemphasize today about reaching to a conclusion about his goals of care.  I encouraged the  family to discuss with palliative care again.  Leukocytosis: Improving.  Hyponatremia: Improved.  Bacteremia: Previous blood cultures grew coag is negative organism.  Probably contaminant.  Repeat culture on 06/17/88 is negative  so far.    DVT prophylaxis:Lovenox Code Status: DNR Family Communication: Discussed with daughter at the bedside Disposition Plan: Unknown   Consultants: PCCM, palliative  care  Procedures: None  Antimicrobials: Zosyn since 1/70/19  Subjective: Patient seen and examined the bedside this morning.  No significant changes from yesterday.  But patient was found to be more comfortable this morning and was moving his legs and was attempting to shake my hand . Still remains nonverbal, not alert /oriented. Objective: Vitals:   06/20/17 2101 06/21/17 0000 06/21/17 0415 06/21/17 0731  BP: (!) 154/52 (!) 122/49 (!) 155/56   Pulse: 99 92 97   Resp: 19  18   Temp: 97.8 F (36.6 C)  99.4 F (37.4 C)   TempSrc: Axillary  Axillary   SpO2: 98%  96% 95%  Weight:   61.8 kg (136 lb 3.9 oz)   Height:        Intake/Output Summary (Last 24 hours) at 06/21/2017 1150 Last data filed at 06/21/2017 0415 Gross per 24 hour  Intake 1868.42 ml  Output 400 ml  Net 1468.42 ml   Filed Weights   06/18/17 0514 06/20/17 1417 06/21/17 0415  Weight: 59.2 kg (130 lb 8 oz) 61.4 kg (135 lb 5.8 oz) 61.8 kg (136 lb 3.9 oz)    Examination:  General exam: Appears calm  ,Not in distress, elderly cachectic male, chronically ill,NG tube Respiratory system: Bilateral decreased air entry on the bases cardiovascular system: S1 & S2 heard, RRR. No JVD, murmurs, rubs, gallops or clicks. No pedal edema. Gastrointestinal system: Abdomen is nondistended, soft and nontender. No organomegaly or masses felt. Normal bowel sounds heard. Central nervous system: Not Alert and oriented. No focal neurological deficits. Extremities: No edema, no clubbing ,no cyanosis, distal peripheral pulses palpable. Skin: No cyanosis,No pallor,No Rash,No Ulcer    Data Reviewed: I have personally reviewed following labs and imaging studies  CBC: Recent Labs  Lab 06/17/17 0500 06/18/17 0537 06/19/17 0640 06/20/17 0549 06/21/17 0500  WBC 20.3* 20.4* 18.3* 13.7* 11.4*  NEUTROABS 17.2* 17.4* 15.1* 11.4* 9.6*  HGB 10.9* 10.7* 10.4* 9.0* 8.9*  HCT 34.1* 33.7* 33.2* 28.9* 27.8*  MCV 71.6* 72.5* 73.3* 73.7* 72.6*   PLT 285 323 365 344 443   Basic Metabolic Panel: Recent Labs  Lab 06/16/17 0430 06/17/17 0500 06/18/17 0537 06/19/17 0640 06/20/17 0549 06/21/17 0500  NA 139 140 143 143 142 140  K 3.4* 3.4* 3.5 3.4* 3.1* 3.4*  CL 90* 89* 91* 94* 95* 99*  CO2 41* 42* 41* 41* 41* 35*  GLUCOSE 142* 168* 119* 103* 294* 156*  BUN 31* 35* 39* 41* 39* 27*  CREATININE 1.06 1.17 1.17 1.15 1.05 0.77  CALCIUM 8.0* 8.2* 8.5* 8.5* 7.9* 8.2*  MG 2.1 2.3 2.4 2.5* 2.3  --    GFR: Estimated Creatinine Clearance: 51.3 mL/min (by C-G formula based on SCr of 0.77 mg/dL). Liver Function Tests: Recent Labs  Lab 06/15/17 0550  AST 26  ALT 34  ALKPHOS 79  BILITOT 0.7  PROT 4.7*  ALBUMIN 2.0*   No results for input(s): LIPASE, AMYLASE in the last 168 hours. No results for input(s): AMMONIA in the last 168 hours. Coagulation Profile: No results for input(s): INR, PROTIME in the last 168 hours. Cardiac Enzymes: No results for input(s): CKTOTAL, CKMB, CKMBINDEX, TROPONINI in the last 168 hours. BNP (last 3 results) No results for input(s): PROBNP in the last 8760 hours. HbA1C: No results for input(s): HGBA1C in the last 72 hours. CBG:  Recent Labs  Lab 06/20/17 1202 06/20/17 1720 06/20/17 2105 06/21/17 0732 06/21/17 1145  GLUCAP 145* 133* 188* 155* 102*   Lipid Profile: No results for input(s): CHOL, HDL, LDLCALC, TRIG, CHOLHDL, LDLDIRECT in the last 72 hours. Thyroid Function Tests: No results for input(s): TSH, T4TOTAL, FREET4, T3FREE, THYROIDAB in the last 72 hours. Anemia Panel: No results for input(s): VITAMINB12, FOLATE, FERRITIN, TIBC, IRON, RETICCTPCT in the last 72 hours. Sepsis Labs: No results for input(s): PROCALCITON, LATICACIDVEN in the last 168 hours.  Recent Results (from the past 240 hour(s))  Culture, blood (routine x 2)     Status: Abnormal   Collection Time: 06/13/17 10:19 AM  Result Value Ref Range Status   Specimen Description BLOOD LEFT HAND  Final   Special Requests  IN PEDIATRIC BOTTLE Blood Culture adequate volume  Final   Culture  Setup Time   Final    GRAM POSITIVE COCCI AEROBIC BOTTLE ONLY CRITICAL RESULT CALLED TO, READ BACK BY AND VERIFIED WITH: EBurman Foster.D. 11:15 06/14/17 ([email or web address])    Culture (A)  Final    STAPHYLOCOCCUS SPECIES (COAGULASE NEGATIVE) THE SIGNIFICANCE OF ISOLATING THIS ORGANISM FROM A SINGLE SET OF BLOOD CULTURES WHEN MULTIPLE SETS ARE DRAWN IS UNCERTAIN. PLEASE NOTIFY THE MICROBIOLOGY DEPARTMENT WITHIN ONE WEEK IF SPECIATION AND SENSITIVITIES ARE REQUIRED. Performed at [facility], [place] [address], [place], [place] [address]    Report Status 06/16/2017 FINAL  Final  Culture, blood (routine x 2)     Status: None   Collection Time: 06/13/17 10:19 AM  Result Value Ref Range Status   Specimen Description BLOOD LEFT HAND  Final   Special Requests IN PEDIATRIC BOTTLE Blood Culture adequate volume  Final   Culture   Final    NO GROWTH 5 DAYS Performed at [facility], [place] [address], [place], [place] [address]    Report Status 06/18/2017 FINAL  Final  Blood Culture ID Panel (Reflexed)     Status: Abnormal   Collection Time: 06/13/17 10:19 AM  Result Value Ref Range Status   Enterococcus species NOT DETECTED NOT DETECTED Final   Listeria monocytogenes NOT DETECTED NOT DETECTED Final   Staphylococcus species DETECTED (A) NOT DETECTED Final    Comment: Methicillin (oxacillin) susceptible coagulase negative staphylococcus. Possible blood culture contaminant (unless isolated from more than one blood culture draw or clinical case suggests pathogenicity). No antibiotic treatment is indicated for blood  culture contaminants. CRITICAL RESULT CALLED TO, READ BACK BY AND VERIFIED WITH: EBurman Foster.D. 11:15 06/14/17 ([email or web address])    Staphylococcus aureus NOT DETECTED NOT DETECTED Final   Methicillin resistance NOT DETECTED NOT DETECTED Final   Streptococcus species NOT DETECTED NOT DETECTED Final   Streptococcus  agalactiae NOT DETECTED NOT DETECTED Final   Streptococcus pneumoniae NOT DETECTED NOT DETECTED Final   Streptococcus pyogenes NOT DETECTED NOT DETECTED Final   Acinetobacter baumannii NOT DETECTED NOT DETECTED Final   Enterobacteriaceae species NOT DETECTED NOT DETECTED Final   Enterobacter cloacae complex NOT DETECTED NOT DETECTED Final   Escherichia coli NOT DETECTED NOT DETECTED Final   Klebsiella oxytoca NOT DETECTED NOT DETECTED Final   Klebsiella pneumoniae NOT DETECTED NOT DETECTED Final   Proteus species NOT DETECTED NOT DETECTED Final   Serratia marcescens NOT DETECTED NOT DETECTED Final   Carbapenem resistance NOT DETECTED NOT DETECTED Final   Haemophilus influenzae NOT DETECTED NOT DETECTED Final   Neisseria meningitidis NOT DETECTED NOT DETECTED Final   Pseudomonas aeruginosa NOT DETECTED NOT DETECTED Final  Candida albicans NOT DETECTED NOT DETECTED Final   Candida glabrata NOT DETECTED NOT DETECTED Final   Candida krusei NOT DETECTED NOT DETECTED Final   Candida parapsilosis NOT DETECTED NOT DETECTED Final   Candida tropicalis NOT DETECTED NOT DETECTED Final  Culture, blood (routine x 2)     Status: None   Collection Time: 06/16/17  8:00 AM  Result Value Ref Range Status   Specimen Description BLOOD BLOOD RIGHT FOREARM  Final   Special Requests IN PEDIATRIC BOTTLE Blood Culture adequate volume  Final   Culture   Final    NO GROWTH 5 DAYS Performed at [facility], [place] [address] [place], [place] [address]    Report Status 06/21/2017 FINAL  Final  Culture, blood (routine x 2)     Status: None   Collection Time: 06/16/17  8:12 AM  Result Value Ref Range Status   Specimen Description BLOOD BLOOD LEFT HAND  Final   Special Requests IN PEDIATRIC BOTTLE Blood Culture adequate volume  Final   Culture   Final    NO GROWTH 5 DAYS Performed at [facility], [place] [address], [place], [place] [address]    Report Status 06/21/2017 FINAL  Final          Radiology Studies: Dg Loyce Dys Tube Plc W/fl W/rad  Result Date: 06/19/2017 CLINICAL DATA:  Aspiration, advancement of feeding to EXAM: NASO G TUBE PLACEMENT WITH FL AND WITH RAD CONTRAST:  Air-contrast FLUOROSCOPY TIME:  Fluoroscopy Time:  5.1 minutes Radiation Exposure Index (if provided by the fluoroscopic device): 65.1 mGy Number of Acquired Spot Images: 1 COMPARISON:  CT 1179 FINDINGS: Under fluoroscopic observation a guide wire was advanced through the length of the feeding tube. Feeding tube was advanced into the third portion duodenum. Guidewire was retracted. Feeding tube flushed with 60 cc water. Initially tube was clogged with material but subsequently freed. IMPRESSION: Advancement of feeding tube to the third portion the duodenum. Electronically Signed   By: Suzy Bouchard M.D.   On: 06/19/2017 13:29        Scheduled Meds: . chlorhexidine  15 mL Mouth Rinse BID  . enoxaparin (LOVENOX) injection  40 mg Subcutaneous Q24H  . feeding supplement (OSMOLITE 1.2 CAL)  1,000 mL Per Tube Q24H  . free water  50 mL Per Tube Q4H  . insulin aspart  0-9 Units Subcutaneous TID WC  . levalbuterol  0.63 mg Nebulization BID  . mouth rinse  15 mL Mouth Rinse q12n4p  . metoprolol tartrate  2.5 mg Intravenous Q6H  . scopolamine  1 patch Transdermal Q72H  . sodium chloride flush  10-40 mL Intracatheter Q12H   Continuous Infusions: . dextrose 5 % and 0.45% NaCl 50 mL/hr at 06/19/17 1429  . famotidine (PEPCID) IV Stopped (06/21/17 1116)  . potassium chloride 10 mEq (06/21/17 1121)  . sodium chloride       LOS: 17 days    Time spent:25 mins    Marene Lenz, MD [facility] Pager [phone]  If 7PM-7AM, please contact night-coverage [email or web address] Password [facility] 06/21/2017, 11:50 AM

## 2017-06-22 ENCOUNTER — Inpatient Hospital Stay (HOSPITAL_COMMUNITY): Payer: Medicare Other

## 2017-06-22 LAB — BASIC METABOLIC PANEL
Anion gap: 4 — ABNORMAL LOW (ref 5–15)
BUN: 25 mg/dL — AB (ref 6–20)
CO2: 34 mmol/L — AB (ref 22–32)
CREATININE: 0.67 mg/dL (ref 0.61–1.24)
Calcium: 8.1 mg/dL — ABNORMAL LOW (ref 8.9–10.3)
Chloride: 101 mmol/L (ref 101–111)
GFR calc non Af Amer: >60 mL/min (ref >60)
Glucose, Bld: 166 mg/dL — ABNORMAL HIGH (ref 65–99)
POTASSIUM: 4.2 mmol/L (ref 3.5–5.1)
Sodium: 139 mmol/L (ref 135–145)

## 2017-06-22 LAB — CBC WITH DIFFERENTIAL/PLATELET
Basophils Absolute: 0 10*3/uL (ref 0.0–0.1)
Basophils Relative: 0 %
Eosinophils Absolute: 0.2 10*3/uL (ref 0.0–0.7)
Eosinophils Relative: 2 %
HEMATOCRIT: 27.2 % — AB (ref 39.0–52.0)
HEMOGLOBIN: 8.6 g/dL — AB (ref 13.0–17.0)
LYMPHS ABS: 1.2 10*3/uL (ref 0.7–4.0)
Lymphocytes Relative: 11 %
MCH: 22.9 pg — AB (ref 26.0–34.0)
MCHC: 31.6 g/dL (ref 30.0–36.0)
MCV: 72.5 fL — ABNORMAL LOW (ref 78.0–100.0)
MONOS PCT: 5 %
Monocytes Absolute: 0.6 10*3/uL (ref 0.1–1.0)
NEUTROS ABS: 8.9 10*3/uL — AB (ref 1.7–7.7)
NEUTROS PCT: 82 %
Platelets: 339 10*3/uL (ref 150–400)
RBC: 3.75 MIL/uL — AB (ref 4.22–5.81)
RDW: 17.4 % — ABNORMAL HIGH (ref 11.5–15.5)
WBC: 10.9 10*3/uL — ABNORMAL HIGH (ref 4.0–10.5)

## 2017-06-22 LAB — GLUCOSE, CAPILLARY
GLUCOSE-CAPILLARY: 124 mg/dL — AB (ref 65–99)
GLUCOSE-CAPILLARY: 108 mg/dL — AB (ref 65–99)
Glucose-Capillary: 118 mg/dL — ABNORMAL HIGH (ref 65–99)
Glucose-Capillary: 152 mg/dL — ABNORMAL HIGH (ref 65–99)

## 2017-06-22 MED ORDER — SODIUM BICARBONATE 650 MG PO TABS
650.0000 mg | ORAL_TABLET | Freq: Once | ORAL | Status: DC
  Filled 2017-06-22: qty 1

## 2017-06-22 MED ORDER — SODIUM BICARBONATE 650 MG PO TABS
650.0000 mg | ORAL_TABLET | Freq: Once | ORAL | Status: AC
  Administered 2017-06-22: 650 mg via ORAL
  Filled 2017-06-22: qty 1

## 2017-06-22 MED ORDER — PANCRELIPASE (LIP-PROT-AMYL) 12000-38000 UNITS PO CPEP
24000.0000 [IU] | ORAL_CAPSULE | Freq: Once | ORAL | Status: AC
  Administered 2017-06-22: 24000 [IU] via ORAL
  Filled 2017-06-22: qty 2

## 2017-06-22 MED ORDER — PANCRELIPASE (LIP-PROT-AMYL) 12000-38000 UNITS PO CPEP
24000.0000 [IU] | ORAL_CAPSULE | Freq: Once | ORAL | Status: DC
  Filled 2017-06-22: qty 2

## 2017-06-22 NOTE — Consult Note (Signed)
Reason for Consult: Consideration for gastrostomy tube placement  Referring Physician: Daylin Gruszka is an 84 y.o. male.  HPI: Patient was admitted with sepsis and bilateral aspiration pneumonia.  Swallowing study has shown aspiration and he will be unable to tolerate oral intake.  Patient was at home being cared for by family prior to admission.  He does have long-standing dementia but his family states he was ambulatory and participating in some activities.  He is currently being fed with a Dobbhoff tube.  His acute infection is under control.  Interventional radiology and GI are unable to place a percutaneous tube due to a very high riding colon overlying the stomach. Patient is not responsive and at this point cannot participate in decision-making.  His grandson is at the bedside who is very involved with his care and is able to represent the rest of the family.  Past Medical History:  Diagnosis Date  . AKI (acute kidney injury) ([place])   . Alzheimers disease   . Anemia of chronic disease   . Aspiration pneumonia of right lower lobe ([place])   . BPH (benign prostatic hyperplasia)   . CVA (cerebral infarction)   . Dysphagia   . Gout   . Hypertensive urgency, malignant   . Influenza A   . Kidney infection   . Left humeral fracture   . Leukocytosis   . Metabolic encephalopathy   . Microcytic anemia   . Physical deconditioning   . Pneumonia   . PSA elevation   . Sepsis ([place])   . Thrombocytosis ([place])   . TIA (transient ischemic attack)   . UTI (lower urinary tract infection)     Past Surgical History:  Procedure Laterality Date  . CATARACT EXTRACTION Left 2012  . MOUTH SURGERY  01/2012   tooth extractions and bone shaved     Family History  Problem Relation Age of Onset  . High blood pressure Sister   . Diabetes Neg Hx     Social History:  reports that  has never smoked. he has never used smokeless tobacco. He reports that he does not drink alcohol or use  drugs.  Allergies: No Known Allergies  Medications:  Current Facility-Administered Medications  Medication Dose Route Frequency Provider Last Rate Last Dose  . chlorhexidine (PERIDEX) 0.12 % solution 15 mL  15 mL Mouth Rinse BID Kinnie Feil, MD   15 mL at 06/21/17 2226  . dextrose 5 %-0.45 % sodium chloride infusion   Intravenous Continuous Aline August, MD 50 mL/hr at 06/19/17 1429    . enoxaparin (LOVENOX) injection 40 mg  40 mg Subcutaneous Q24H Theodis Blaze, MD   40 mg at 06/21/17 2154  . famotidine (PEPCID) IVPB 20 mg premix  20 mg Intravenous Q12H Rosita Fire, MD   Stopped at 06/22/17 1427  . feeding supplement (OSMOLITE 1.2 CAL) liquid 1,000 mL  1,000 mL Per Tube Q24H Starla Link, Kshitiz, MD 55 mL/hr at 06/21/17 1625 1,000 mL at 06/21/17 1625  . free water 50 mL  50 mL Per Tube Q4H Alekh, Kshitiz, MD   50 mL at 06/21/17 2337  . hydrALAZINE (APRESOLINE) injection 5 mg  5 mg Intravenous Q4H PRN Aline August, MD   5 mg at 06/12/17 0407  . insulin aspart (novoLOG) injection 0-9 Units  0-9 Units Subcutaneous TID WC Marene Lenz, MD   2 Units at 06/21/17 1803  . levalbuterol (XOPENEX) nebulizer solution 0.63 mg  0.63 mg Nebulization Q6H PRN Alekh, Kshitiz,  MD      . levalbuterol Penne Lash) nebulizer solution 0.63 mg  0.63 mg Nebulization BID Aline August, MD   0.63 mg at 06/22/17 0858  . lipase/protease/amylase (CREON) capsule 24,000 Units  24,000 Units Oral Once Adhikari Bk, Amrit, MD       And  . sodium bicarbonate tablet 650 mg  650 mg Oral Once Adhikari Bk, Amrit, MD      . MEDLINE mouth rinse  15 mL Mouth Rinse q12n4p Buriev, Arie Sabina, MD   15 mL at 06/22/17 1220  . metoprolol tartrate (LOPRESSOR) injection 2.5 mg  2.5 mg Intravenous Q6H Kirby-Graham, Karsten Fells, NP   2.5 mg at 06/22/17 1312  . morphine 4 MG/ML injection 2 mg  2 mg Intravenous I3K PRN Pershing Proud, NP   2 mg at 06/12/17 1218  . polyvinyl alcohol (LIQUIFILM TEARS) 1.4 % ophthalmic solution 1 drop   1 drop Both Eyes PRN Pershing Proud, NP      . scopolamine (TRANSDERM-SCOP) 1 MG/3DAYS 1.5 mg  1 patch Transdermal Q72H Truett Mainland, DO   1.5 mg at 06/21/17 1140  . sodium chloride 0.9 % bolus 1,000 mL  1,000 mL Intravenous Once Schorr, Rhetta Mura, NP      . sodium chloride flush (NS) 0.9 % injection 10-40 mL  10-40 mL Intracatheter Q12H Truett Mainland, DO   10 mL at 06/22/17 1434  . sodium chloride flush (NS) 0.9 % injection 10-40 mL  10-40 mL Intracatheter PRN Truett Mainland, DO   10 mL at 06/20/17 0945     Results for orders placed or performed during the hospital encounter of 06/04/17 (from the past 48 hour(s))  Glucose, capillary     Status: Abnormal   Collection Time: 06/20/17  5:20 PM  Result Value Ref Range   Glucose-Capillary 133 (H) 65 - 99 mg/dL   Comment 1 Notify RN    Comment 2 Document in Chart   Glucose, capillary     Status: Abnormal   Collection Time: 06/20/17  9:05 PM  Result Value Ref Range   Glucose-Capillary 188 (H) 65 - 99 mg/dL  CBC with Differential/Platelet     Status: Abnormal   Collection Time: 06/21/17  5:00 AM  Result Value Ref Range   WBC 11.4 (H) 4.0 - 10.5 K/uL   RBC 3.83 (L) 4.22 - 5.81 MIL/uL   Hemoglobin 8.9 (L) 13.0 - 17.0 g/dL   HCT 27.8 (L) 39.0 - 52.0 %   MCV 72.6 (L) 78.0 - 100.0 fL   MCH 23.2 (L) 26.0 - 34.0 pg   MCHC 32.0 30.0 - 36.0 g/dL   RDW 17.1 (H) 11.5 - 15.5 %   Platelets 327 150 - 400 K/uL   Neutrophils Relative % 83 %   Neutro Abs 9.6 (H) 1.7 - 7.7 K/uL   Lymphocytes Relative 10 %   Lymphs Abs 1.1 0.7 - 4.0 K/uL   Monocytes Relative 6 %   Monocytes Absolute 0.7 0.1 - 1.0 K/uL   Eosinophils Relative 1 %   Eosinophils Absolute 0.1 0.0 - 0.7 K/uL   Basophils Relative 0 %   Basophils Absolute 0.0 0.0 - 0.1 K/uL  Basic metabolic panel     Status: Abnormal   Collection Time: 06/21/17  5:00 AM  Result Value Ref Range   Sodium 140 135 - 145 mmol/L   Potassium 3.4 (L) 3.5 - 5.1 mmol/L   Chloride 99 (L) 101 - 111  mmol/L   CO2 35 (  H) 22 - 32 mmol/L   Glucose, Bld 156 (H) 65 - 99 mg/dL   BUN 27 (H) 6 - 20 mg/dL   Creatinine, Ser 0.77 0.61 - 1.24 mg/dL   Calcium 8.2 (L) 8.9 - 10.3 mg/dL   GFR calc non Af Amer >60 >60 mL/min   GFR calc Af Amer >60 >60 mL/min    Comment: (NOTE) The eGFR has been calculated using the CKD EPI equation. This calculation has not been validated in all clinical situations. eGFR's persistently <60 mL/min signify possible Chronic Kidney Disease.    Anion gap 6 5 - 15  Glucose, capillary     Status: Abnormal   Collection Time: 06/21/17  7:32 AM  Result Value Ref Range   Glucose-Capillary 155 (H) 65 - 99 mg/dL   Comment 1 Notify RN    Comment 2 Document in Chart   Glucose, capillary     Status: Abnormal   Collection Time: 06/21/17 11:45 AM  Result Value Ref Range   Glucose-Capillary 102 (H) 65 - 99 mg/dL   Comment 1 Notify RN    Comment 2 Document in Chart   Glucose, capillary     Status: Abnormal   Collection Time: 06/21/17  5:38 PM  Result Value Ref Range   Glucose-Capillary 164 (H) 65 - 99 mg/dL   Comment 1 Notify RN    Comment 2 Document in Chart   Glucose, capillary     Status: Abnormal   Collection Time: 06/21/17 10:22 PM  Result Value Ref Range   Glucose-Capillary 143 (H) 65 - 99 mg/dL   Comment 1 Notify RN   CBC with Differential/Platelet     Status: Abnormal   Collection Time: 06/22/17  4:20 AM  Result Value Ref Range   WBC 10.9 (H) 4.0 - 10.5 K/uL   RBC 3.75 (L) 4.22 - 5.81 MIL/uL   Hemoglobin 8.6 (L) 13.0 - 17.0 g/dL   HCT 27.2 (L) 39.0 - 52.0 %   MCV 72.5 (L) 78.0 - 100.0 fL   MCH 22.9 (L) 26.0 - 34.0 pg   MCHC 31.6 30.0 - 36.0 g/dL   RDW 17.4 (H) 11.5 - 15.5 %   Platelets 339 150 - 400 K/uL   Neutrophils Relative % 82 %   Neutro Abs 8.9 (H) 1.7 - 7.7 K/uL   Lymphocytes Relative 11 %   Lymphs Abs 1.2 0.7 - 4.0 K/uL   Monocytes Relative 5 %   Monocytes Absolute 0.6 0.1 - 1.0 K/uL   Eosinophils Relative 2 %   Eosinophils Absolute 0.2 0.0 -  0.7 K/uL   Basophils Relative 0 %   Basophils Absolute 0.0 0.0 - 0.1 K/uL  Basic metabolic panel     Status: Abnormal   Collection Time: 06/22/17  4:20 AM  Result Value Ref Range   Sodium 139 135 - 145 mmol/L   Potassium 4.2 3.5 - 5.1 mmol/L    Comment: DELTA CHECK NOTED NO VISIBLE HEMOLYSIS    Chloride 101 101 - 111 mmol/L   CO2 34 (H) 22 - 32 mmol/L   Glucose, Bld 166 (H) 65 - 99 mg/dL   BUN 25 (H) 6 - 20 mg/dL   Creatinine, Ser 0.67 0.61 - 1.24 mg/dL   Calcium 8.1 (L) 8.9 - 10.3 mg/dL   GFR calc non Af Amer >60 >60 mL/min   GFR calc Af Amer >60 >60 mL/min    Comment: (NOTE) The eGFR has been calculated using the CKD EPI equation. This calculation has not  been validated in all clinical situations. eGFR's persistently <60 mL/min signify possible Chronic Kidney Disease.    Anion gap 4 (L) 5 - 15  Glucose, capillary     Status: Abnormal   Collection Time: 06/22/17  8:03 AM  Result Value Ref Range   Glucose-Capillary 124 (H) 65 - 99 mg/dL  Glucose, capillary     Status: Abnormal   Collection Time: 06/22/17 12:21 PM  Result Value Ref Range   Glucose-Capillary 108 (H) 65 - 99 mg/dL   Comment 1 Notify RN    Comment 2 Document in Chart     Dg Chest 1 View  Result Date: 06/21/2017 CLINICAL DATA:  Follow-up bilateral airspace disease. EXAM: CHEST 1 VIEW COMPARISON:  Chest x-ray of June 18, 2017 FINDINGS: The lungs are adequately inflated. Known known bilateral posterior layering pleural effusions are not clearly evident on this AP portable exam. The interstitial markings are coarse. There are confluent lung markings at both bases. The retrocardiac region remains dense and the left hemidiaphragm obscured. The cardiac silhouette is normal in size. The pulmonary vascularity is not clearly engorged. There is calcification in the wall of the aortic arch. There is deformity of the proximal right humerus consistent with an ununited humeral neck fracture. IMPRESSION: Bibasilar atelectasis  or pneumonia. Known bilateral pleural effusions. Mild interstitial edema without significant pulmonary vascular congestion. The feeding tube tip projects below the inferior margin of the image likely in the duodenum. Thoracic aortic atherosclerosis. Electronically Signed   By: David  Martinique M.D.   On: 06/21/2017 12:36   Dg Abd Portable 1v  Result Date: 06/22/2017 CLINICAL DATA:  Check feeding tube EXAM: PORTABLE ABDOMEN - 1 VIEW COMPARISON:  Fluoroscopic images dated 06/19/2016 FINDINGS: Weighted feeding tube terminates in the proximal 2nd portion of the duodenum. Nonobstructive bowel gas pattern. Degenerative changes the lumbar spine. IMPRESSION: Weighted feeding tube terminates in the proximal 2nd portion of the duodenum. Electronically Signed   By: Julian Hy M.D.   On: 06/22/2017 10:49    Review of Systems  Unable to perform ROS: Patient unresponsive   Blood pressure 139/72, pulse 93, temperature 99.5 F (37.5 C), temperature source Axillary, resp. rate 20, height 5' 5" (1.651 m), weight 62 kg (136 lb 11 oz), SpO2 98 %. Physical Exam General: Thin elderly Afro-American male who will open eyes a little bit to stimulation but nonverbal and essentially nonresponsive. Lungs: Shallow respirations without rales or wheezing or increased work of breathing Abdomen: No incisions.  Soft and without apparent tenderness. Extremities: No edema Neurologic: Opens eyes to marked stimulation.  Does not follow commands and is nonverbal.   Assessment/Plan: Elderly male with recent sepsis and aspiration pneumonia.  Unable to tolerate any oral feedings due to aspiration.  Currently has a Dobbhoff tube in for feeding.  Unable to have percutaneously placed feeding tube due to position of his colon over the stomach.  I discussed the situation at some length with his grandson.  They feel that his grandfather had a decent quality of life prior to this illness.  However currently he is not able to get out of  bed and he is minimally if at all responsive.  I discussed with him that a surgically placed tube would require general anesthesia and laparotomy or laparoscopy with attendant risks and I think a significant chance of remaining intubated following the procedure.  We discussed that this would be a significant procedure to put him through if he is not going to have any quality of  life going forward even if we can avoid serious complications. At this point I do not feel that he is a candidate for a surgically placed feeding tube and after discussing the nature of the procedure and potential risks with the family they are in agreement.  I would favor for now continued Dobbhoff tube feedings.  If he tolerates these and is able to gain strength and have some quality of life then we could reconsider placing a gastrostomy tube in several weeks.  I think this will give the family time to consider goals of care and his quality of life and see if he can bounce back from this more acute illness for which she is still recovering.  They are very comfortable with this plan.   Darene Lamer Mohanad Carsten 06/22/2017, 4:25 PM

## 2017-06-22 NOTE — Progress Notes (Signed)
Tube feeding has been on hold for the entire shift due to feeding tube bing clogged. Orders were entered for de-clog, but was unable to de-clog. MD Adhikari BK.

## 2017-06-22 NOTE — Progress Notes (Signed)
Received request to place a PEG tube after Radiology evaluated and declined to place a percutaneous gastrostomy tube due to the patient's transverse located colon anterior to the stomach. The location of the transverse colon anterior to the stomach precludes safe PEG tube placement. Both endoscopic and IR placed G-tubes require the same safe access to the stomach without other structures in between the abdominal wall and the stomach.

## 2017-06-22 NOTE — Progress Notes (Addendum)
PROGRESS NOTE    Douglas Mckee  ZOX:[id number] DOB: 08/13/1922 DOA: 06/04/2017 PCP: Verline Lema, MD   Brief Narrative: Patient is a 84 year old male with past medical history of BPH, hypertension, diabetes, TIA, dementia, recurrent pneumonia who presented to the emergency department with worsening dyspnea, vomiting.  He was found to be febrile, tachycardic with elevated lactic acid and leukocytosis.  Chest x-ray showed bilateral airspace disease.  Suspected to be from aspiration pneumonia.  He was started on IV antibiotics.  Critical care was consulted.  Patient was admitted on stepdown unit.  Patient has been transferred to hospital service.  Patient has chronic issues with aspiration .He is unable to swallow and currently on tube feeding .  Palliative care on board.  Family is yet to decide upon further goals of treatment.   Assessment & Plan:   Principal Problem:   Sepsis due to pneumonia [facility]) Active Problems:   Metabolic encephalopathy   BPH (benign prostatic hyperplasia)   Alzheimer's dementia   Demand ischemia (HCC)   Transaminitis   Acute respiratory failure with hypoxemia (HCC)   Pneumonia of both lungs due to infectious organism   Goals of care, counseling/discussion   Palliative care encounter   SOB (shortness of breath)   Pressure injury of skin   Essential hypertension  Sepsis: Resolved.  Currently he is hemodynamically stable.  Antibiotics stopped today  Acute respiratory failure with hypoxia: Was initially started on BiPAP.  Currently on nasal cannula.  Oxygen will be weaned off as tolerated.  Family desires to continue medical care.  Currently he is DNI/DNR.  Steroids stopped  Bilateral pneumonia: Suspected to be from aspiration.  Patient initially completed treatment with Zosyn for 7 days.  CT scan of the chest done on 06/14/17 showed multifocal cavitary pneumonia with concern for atypical organisms for septic emboli as well as moderate to large bilateral pleural  effusions.  Patient was started on vancomycin  on 06/14/17. Vancomycin discontinued on 06/17/17 as there is no evidence of MRSA infection.  Zosyn  discontinued  Since he has recurrent pneumonia, we have discussed with family members multiple times about possible hospice/comfort measures.  They have not decided yet. Daughter and grandson were present on the bedside today. They agree upon PEG placement. IR was consulted but they said since his transverse colon is infront of the stomach on his latest CAT scan, it is not possible through IR. Consulted gastroenterology today.  Clogged NG tube:Patient continues to have NG tube feeding which the family wants.  NG tube clogged today.  Requested for putting a new NG tube.  Transaminitis with cholelithiasis: LFTs improved.  Metabolic encephalopathy: Mental status has been improving.  Does not communicate at present.  As per the family, he was walking and talking before he was admitted.  Hypertension: Continue current medications.  We will continue to monitor blood pressure.  Multiple Comorbidities, deconditioning, poor prognosis: Palliative care was following.  Patient's family members did not  not want any further input from palliative care team.  Family has been addressed several times about discussion on hospice/comfort measures.  Family members not ready to make that decision yet.  We consulted ethics committee. I did reemphasize today about reaching to a conclusion about his goals of care.  I have encouraged the  family to discuss with palliative care again.  Leukocytosis: Improving.  Hyponatremia: Improved.  Bacteremia: Previous blood cultures grew coag is negative organism.  Probably contaminant.  Repeat culture on 06/17/88 is negative so far.  DVT prophylaxis:Lovenox Code Status: DNR Family Communication: Discussed with granddson, daughter at the bedside Disposition Plan: Unknown   Consultants: PCCM, palliative care  Procedures:  None  Antimicrobials: None  Subjective: Patient seen and examined the bedside this morning.  No significant changes from yesterday.  Remains comfortable.  Not alert or oriented.Tube feeding found to be clogged this morning.  objective: Vitals:   06/21/17 2159 06/22/17 0430 06/22/17 0900 06/22/17 1310  BP: (!) 124/58 (!) 126/57  139/72  Pulse: 86 87  93  Resp: 20 20    Temp: 99.3 F (37.4 C) 99.5 F (37.5 C)    TempSrc: Axillary Axillary    SpO2: 100% 100% 98%   Weight:  62 kg (136 lb 11 oz)    Height:        Intake/Output Summary (Last 24 hours) at 06/22/2017 1344 Last data filed at 06/21/2017 1625 Gross per 24 hour  Intake 1456.75 ml  Output -  Net 1456.75 ml   Filed Weights   06/20/17 1417 06/21/17 0415 06/22/17 0430  Weight: 61.4 kg (135 lb 5.8 oz) 61.8 kg (136 lb 3.9 oz) 62 kg (136 lb 11 oz)    Examination:  General exam: Appears calm  ,Not in distress, elderly cachectic male, chronically ill,NG tube Respiratory system: Bilateral decreased air entry on the bases cardiovascular system: S1 & S2 heard, RRR. No JVD, murmurs, rubs, gallops or clicks. No pedal edema. Gastrointestinal system: Abdomen is nondistended, soft and nontender. No organomegaly or masses felt. Normal bowel sounds heard. Central nervous system: Not Alert and oriented. No focal neurological deficits. Extremities: No edema, no clubbing ,no cyanosis, distal peripheral pulses palpable. Skin: No cyanosis,No pallor,No Rash,No Ulcer    Data Reviewed: I have personally reviewed following labs and imaging studies  CBC: Recent Labs  Lab 06/18/17 0537 06/19/17 0640 06/20/17 0549 06/21/17 0500 06/22/17 0420  WBC 20.4* 18.3* 13.7* 11.4* 10.9*  NEUTROABS 17.4* 15.1* 11.4* 9.6* 8.9*  HGB 10.7* 10.4* 9.0* 8.9* 8.6*  HCT 33.7* 33.2* 28.9* 27.8* 27.2*  MCV 72.5* 73.3* 73.7* 72.6* 72.5*  PLT 323 365 344 327 161   Basic Metabolic Panel: Recent Labs  Lab 06/16/17 0430 06/17/17 0500 06/18/17 0537  06/19/17 0640 06/20/17 0549 06/21/17 0500 06/22/17 0420  NA 139 140 143 143 142 140 139  K 3.4* 3.4* 3.5 3.4* 3.1* 3.4* 4.2  CL 90* 89* 91* 94* 95* 99* 101  CO2 41* 42* 41* 41* 41* 35* 34*  GLUCOSE 142* 168* 119* 103* 294* 156* 166*  BUN 31* 35* 39* 41* 39* 27* 25*  CREATININE 1.06 1.17 1.17 1.15 1.05 0.77 0.67  CALCIUM 8.0* 8.2* 8.5* 8.5* 7.9* 8.2* 8.1*  MG 2.1 2.3 2.4 2.5* 2.3  --   --    GFR: Estimated Creatinine Clearance: 51.3 mL/min (by C-G formula based on SCr of 0.67 mg/dL). Liver Function Tests: No results for input(s): AST, ALT, ALKPHOS, BILITOT, PROT, ALBUMIN in the last 168 hours. No results for input(s): LIPASE, AMYLASE in the last 168 hours. No results for input(s): AMMONIA in the last 168 hours. Coagulation Profile: No results for input(s): INR, PROTIME in the last 168 hours. Cardiac Enzymes: No results for input(s): CKTOTAL, CKMB, CKMBINDEX, TROPONINI in the last 168 hours. BNP (last 3 results) No results for input(s): PROBNP in the last 8760 hours. HbA1C: No results for input(s): HGBA1C in the last 72 hours. CBG: Recent Labs  Lab 06/21/17 1145 06/21/17 1738 06/21/17 2222 06/22/17 0803 06/22/17 1221  GLUCAP 102* 164* 143* 124*  108*   Lipid Profile: No results for input(s): CHOL, HDL, LDLCALC, TRIG, CHOLHDL, LDLDIRECT in the last 72 hours. Thyroid Function Tests: No results for input(s): TSH, T4TOTAL, FREET4, T3FREE, THYROIDAB in the last 72 hours. Anemia Panel: No results for input(s): VITAMINB12, FOLATE, FERRITIN, TIBC, IRON, RETICCTPCT in the last 72 hours. Sepsis Labs: No results for input(s): PROCALCITON, LATICACIDVEN in the last 168 hours.  Recent Results (from the past 240 hour(s))  Culture, blood (routine x 2)     Status: Abnormal   Collection Time: 06/13/17 10:19 AM  Result Value Ref Range Status   Specimen Description BLOOD LEFT HAND  Final   Special Requests IN PEDIATRIC BOTTLE Blood Culture adequate volume  Final   Culture  Setup Time    Final    GRAM POSITIVE COCCI AEROBIC BOTTLE ONLY CRITICAL RESULT CALLED TO, READ BACK BY AND VERIFIED WITH: EBurman Foster.D. 11:15 06/14/17 ([email or web address])    Culture (A)  Final    STAPHYLOCOCCUS SPECIES (COAGULASE NEGATIVE) THE SIGNIFICANCE OF ISOLATING THIS ORGANISM FROM A SINGLE SET OF BLOOD CULTURES WHEN MULTIPLE SETS ARE DRAWN IS UNCERTAIN. PLEASE NOTIFY THE MICROBIOLOGY DEPARTMENT WITHIN ONE WEEK IF SPECIATION AND SENSITIVITIES ARE REQUIRED. Performed at [facility], [place] [address], [place], [place] [address]    Report Status 06/16/2017 FINAL  Final  Culture, blood (routine x 2)     Status: None   Collection Time: 06/13/17 10:19 AM  Result Value Ref Range Status   Specimen Description BLOOD LEFT HAND  Final   Special Requests IN PEDIATRIC BOTTLE Blood Culture adequate volume  Final   Culture   Final    NO GROWTH 5 DAYS Performed at [facility], [place] [address] [place], [place] [address]    Report Status 06/18/2017 FINAL  Final  Blood Culture ID Panel (Reflexed)     Status: Abnormal   Collection Time: 06/13/17 10:19 AM  Result Value Ref Range Status   Enterococcus species NOT DETECTED NOT DETECTED Final   Listeria monocytogenes NOT DETECTED NOT DETECTED Final   Staphylococcus species DETECTED (A) NOT DETECTED Final    Comment: Methicillin (oxacillin) susceptible coagulase negative staphylococcus. Possible blood culture contaminant (unless isolated from more than one blood culture draw or clinical case suggests pathogenicity). No antibiotic treatment is indicated for blood  culture contaminants. CRITICAL RESULT CALLED TO, READ BACK BY AND VERIFIED WITH: EBurman Foster.D. 11:15 06/14/17 ([email or web address])    Staphylococcus aureus NOT DETECTED NOT DETECTED Final   Methicillin resistance NOT DETECTED NOT DETECTED Final   Streptococcus species NOT DETECTED NOT DETECTED Final   Streptococcus agalactiae NOT DETECTED NOT DETECTED Final   Streptococcus pneumoniae NOT DETECTED NOT  DETECTED Final   Streptococcus pyogenes NOT DETECTED NOT DETECTED Final   Acinetobacter baumannii NOT DETECTED NOT DETECTED Final   Enterobacteriaceae species NOT DETECTED NOT DETECTED Final   Enterobacter cloacae complex NOT DETECTED NOT DETECTED Final   Escherichia coli NOT DETECTED NOT DETECTED Final   Klebsiella oxytoca NOT DETECTED NOT DETECTED Final   Klebsiella pneumoniae NOT DETECTED NOT DETECTED Final   Proteus species NOT DETECTED NOT DETECTED Final   Serratia marcescens NOT DETECTED NOT DETECTED Final   Carbapenem resistance NOT DETECTED NOT DETECTED Final   Haemophilus influenzae NOT DETECTED NOT DETECTED Final   Neisseria meningitidis NOT DETECTED NOT DETECTED Final   Pseudomonas aeruginosa NOT DETECTED NOT DETECTED Final   Candida albicans NOT DETECTED NOT DETECTED Final   Candida glabrata NOT DETECTED NOT DETECTED Final  Candida krusei NOT DETECTED NOT DETECTED Final   Candida parapsilosis NOT DETECTED NOT DETECTED Final   Candida tropicalis NOT DETECTED NOT DETECTED Final  Culture, blood (routine x 2)     Status: None   Collection Time: 06/16/17  8:00 AM  Result Value Ref Range Status   Specimen Description BLOOD BLOOD RIGHT FOREARM  Final   Special Requests IN PEDIATRIC BOTTLE Blood Culture adequate volume  Final   Culture   Final    NO GROWTH 5 DAYS Performed at [facility], [place] [address] [place], [place] [address]    Report Status 06/21/2017 FINAL  Final  Culture, blood (routine x 2)     Status: None   Collection Time: 06/16/17  8:12 AM  Result Value Ref Range Status   Specimen Description BLOOD BLOOD LEFT HAND  Final   Special Requests IN PEDIATRIC BOTTLE Blood Culture adequate volume  Final   Culture   Final    NO GROWTH 5 DAYS Performed at [facility], [place] [address] [place], [place] [address]    Report Status 06/21/2017 FINAL  Final         Radiology Studies: Dg Chest 1 View  Result Date: 06/21/2017 CLINICAL DATA:  Follow-up  bilateral airspace disease. EXAM: CHEST 1 VIEW COMPARISON:  Chest x-ray of June 18, 2017 FINDINGS: The lungs are adequately inflated. Known known bilateral posterior layering pleural effusions are not clearly evident on this AP portable exam. The interstitial markings are coarse. There are confluent lung markings at both bases. The retrocardiac region remains dense and the left hemidiaphragm obscured. The cardiac silhouette is normal in size. The pulmonary vascularity is not clearly engorged. There is calcification in the wall of the aortic arch. There is deformity of the proximal right humerus consistent with an ununited humeral neck fracture. IMPRESSION: Bibasilar atelectasis or pneumonia. Known bilateral pleural effusions. Mild interstitial edema without significant pulmonary vascular congestion. The feeding tube tip projects below the inferior margin of the image likely in the duodenum. Thoracic aortic atherosclerosis. Electronically Signed   By: David  Martinique M.D.   On: 06/21/2017 12:36   Dg Abd Portable 1v  Result Date: 06/22/2017 CLINICAL DATA:  Check feeding tube EXAM: PORTABLE ABDOMEN - 1 VIEW COMPARISON:  Fluoroscopic images dated 06/19/2016 FINDINGS: Weighted feeding tube terminates in the proximal 2nd portion of the duodenum. Nonobstructive bowel gas pattern. Degenerative changes the lumbar spine. IMPRESSION: Weighted feeding tube terminates in the proximal 2nd portion of the duodenum. Electronically Signed   By: Julian Hy M.D.   On: 06/22/2017 10:49        Scheduled Meds: . chlorhexidine  15 mL Mouth Rinse BID  . enoxaparin (LOVENOX) injection  40 mg Subcutaneous Q24H  . feeding supplement (OSMOLITE 1.2 CAL)  1,000 mL Per Tube Q24H  . free water  50 mL Per Tube Q4H  . insulin aspart  0-9 Units Subcutaneous TID WC  . levalbuterol  0.63 mg Nebulization BID  . lipase/protease/amylase  24,000 Units Oral Once   And  . sodium bicarbonate  650 mg Oral Once  . mouth rinse  15 mL  Mouth Rinse q12n4p  . metoprolol tartrate  2.5 mg Intravenous Q6H  . scopolamine  1 patch Transdermal Q72H  . sodium chloride flush  10-40 mL Intracatheter Q12H   Continuous Infusions: . dextrose 5 % and 0.45% NaCl 50 mL/hr at 06/19/17 1429  . famotidine (PEPCID) IV 20 mg (06/22/17 1149)  . sodium chloride  LOS: 18 days    Time spent:25 mins    Zaire Vanbuskirk Jodie Echevaria, MD [facility] Pager [phone]  If 7PM-7AM, please contact night-coverage [email or web address] Password [email or web address] 06/22/2017, 1:44 PM

## 2017-06-22 NOTE — Progress Notes (Signed)
Request received for possible percutaneous gastrostomy tube placement in patient.  Latest imaging studies reviewed by Dr. Kathlene Cote and patient's transverse colon is in front of stomach on latest CT which precludes safe placement of percutaneous gastrostomy tube.

## 2017-06-22 NOTE — Progress Notes (Signed)
Pt's TF and H20 flushes have infused without incident throughout shift. At 0530, kangaroo pump began to alarm "clog downstream of pump". Tubing changed, lopez valve changed, alarm persisted. Attempted to manually flush panda tube, met resistance and unable to flush. On call provider paged for further orders. Hortencia Conradi RN

## 2017-06-22 NOTE — Progress Notes (Signed)
Per pt discussion in long length of stay, pt could qualify for transfer to [facility] if needs to continue on Panda feeds until long term goals decided. [facility] rep has alerted this CM that they would be able to offer pt a bed. In the meantime pt panda has clogged and placement of a peg tube has been discussed. This CM approached pt grandson about disposition planning and he states that they are unable to make any further decisions until MD has spoken to them about whether peg tube can be place. MD made aware. CM will continue to follow. Marney Doctor RN,BSN,NCM [phone]

## 2017-06-23 LAB — GLUCOSE, CAPILLARY
GLUCOSE-CAPILLARY: 131 mg/dL — AB (ref 65–99)
Glucose-Capillary: 148 mg/dL — ABNORMAL HIGH (ref 65–99)
Glucose-Capillary: 116 mg/dL — ABNORMAL HIGH (ref 65–99)

## 2017-06-23 MED ORDER — INSULIN ASPART 100 UNIT/ML ~~LOC~~ SOLN: 0.0000 [IU] | Freq: Three times a day (TID) | SUBCUTANEOUS | 0 refills | Status: AC

## 2017-06-23 MED ORDER — ENOXAPARIN SODIUM 40 MG/0.4ML ~~LOC~~ SOLN: 40.0000 mg | SUBCUTANEOUS | Status: AC

## 2017-06-23 MED ORDER — METOPROLOL TARTRATE 5 MG/5ML IV SOLN: 2.5000 mg | Freq: Four times a day (QID) | INTRAVENOUS | Status: AC

## 2017-06-23 MED ORDER — MORPHINE SULFATE (PF) 4 MG/ML IV SOLN: 2.0000 mg | INTRAVENOUS | 0 refills | Status: AC | PRN

## 2017-06-23 MED ORDER — FREE WATER: 50.0000 mL | Status: AC

## 2017-06-23 MED ORDER — CHLORHEXIDINE GLUCONATE 0.12 % MT SOLN: 15.0000 mL | Freq: Two times a day (BID) | OROMUCOSAL | 0 refills | Status: AC

## 2017-06-23 MED ORDER — ORAL CARE MOUTH RINSE: 15.0000 mL | Freq: Two times a day (BID) | OROMUCOSAL | 0 refills | Status: AC

## 2017-06-23 MED ORDER — FAMOTIDINE IN NACL 20-0.9 MG/50ML-% IV SOLN: 20.0000 mg | Freq: Two times a day (BID) | INTRAVENOUS | Status: AC

## 2017-06-23 MED ORDER — HYDRALAZINE HCL 20 MG/ML IJ SOLN: 5.0000 mg | INTRAMUSCULAR | Status: AC | PRN

## 2017-06-23 MED ORDER — LEVALBUTEROL HCL 0.63 MG/3ML IN NEBU: 0.6300 mg | INHALATION_SOLUTION | Freq: Four times a day (QID) | RESPIRATORY_TRACT | 0 refills | Status: AC | PRN

## 2017-06-23 MED ORDER — POLYVINYL ALCOHOL 1.4 % OP SOLN: 1.0000 [drp] | OPHTHALMIC | 0 refills | Status: AC | PRN

## 2017-06-23 MED ORDER — SCOPOLAMINE 1 MG/3DAYS TD PT72: 1.0000 | MEDICATED_PATCH | TRANSDERMAL | 0 refills | Status: AC

## 2017-06-23 MED ORDER — LEVALBUTEROL HCL 0.63 MG/3ML IN NEBU: 0.6300 mg | INHALATION_SOLUTION | Freq: Two times a day (BID) | RESPIRATORY_TRACT | 0 refills | Status: AC

## 2017-06-23 MED ORDER — OSMOLITE 1.2 CAL PO LIQD: 1000.0000 mL | ORAL | 0 refills | Status: AC

## 2017-06-23 NOTE — Discharge Summary (Addendum)
Physician Discharge Summary  Douglas Mckee KDX:[id number] DOB: March 26, 1923 DOA: 06/04/2017  PCP: Verline Lema, MD  Admit date: 06/04/2017 Discharge date: 06/23/2017  Admitted From: Home Disposition: LTAC   Discharge Condition:Guarded CODE STATUS: DNR Diet recommendation: Tube feeding  Brief/Interim Summary:  Patient is a 84 year old male with past medical history of BPH, hypertension, diabetes, TIA, dementia, recurrent pneumonia who presented to the emergency department with worsening dyspnea, vomiting.  He was found to be febrile, tachycardic with elevated lactic acid and leukocytosis.  Chest x-ray showed bilateral airspace disease.  Suspected to be from aspiration pneumonia.  He was started on IV antibiotics.  Critical care was consulted.  Patient was admitted on stepdown unit. After his  respiratory status stabilized , he was transferred to hospital service. Currently his  overall condition is stable .Patient has chronic issues with aspiration .He is unable to swallow and currently on tube feeding .    We discussed multiple times with family about goals of care .Palliative care was also on board.  Though he is DNI/DNR, family did not want to initiate comfort care or recreational feeding and wished to continue tube feeding.  We discussed the options of placing PEG tube or G-tube for feeding.  Patient was evaluated by IR, gastroenterology and general surgery.  He was finally determined not to be a candidate for this procedures. Since patient's family desired to continue tube feeding, he will be discharged today to [facility].This is the only discharge option we had.   Following problems were addressed during his hospitalization:  Sepsis: Resolved.  Currently he is hemodynamically stable.    He completed the antibiotics course.  Acute respiratory failure with hypoxia: Was initially started on BiPAP.  Currently on nasal cannula.  Oxygen should be  weaned off as tolerated.  Family desires to continue  medical care.  Currently he is DNI/DNR.  Steroids stopped  Bilateral pneumonia: Suspected to be from aspiration.  Patient initially completed treatment with Zosyn for 7 days.  CT scan of the chest done on 06/14/17 showed multifocal cavitary pneumonia with concern for atypical organisms for septic emboli as well as moderate to large bilateral pleural effusions.  Patient was started on vancomycin and Zosyn on 06/14/17. Vancomycin discontinued on 06/17/17 as there is no evidence of MRSA infection.  Zosyn also discontinued  2 days ago. Since he has recurrent pneumonia, we have discussed with family members multiple times about possible hospice/comfort measures.  They have not decided yet. They agree upon PEG/G tube  placement. IR was consulted but they said since his transverse colon is infront of the stomach on his latest CAT scan, it is not possible through IR.  Gastroenterology said the same.  Surgery evaluated the patient and thought that he is a high risk candidate for any intervention.  There is no option besides continue tube feeding. Last chest x-ray done on 06/21/17 was suggestive  of bibasilar atelectasis.  Clogged NG tube:New  Tube put on 06/22/17  Transaminitis with cholelithiasis: LFTs improved.  Metabolic encephalopathy: Mental status has been improving.  Does not communicate at present.  As per the family, he was walking and talking before he was admitted.  Hypertension: Continue current medications.  We will continue to monitor blood pressure.  Multiple Comorbidities, deconditioning, poor prognosis: Palliative care was following.  Patient's family members did not  not want any further input from palliative care team.  Family has been addressed several times about discussion on hospice/comfort measures.  Family members not ready to make  that decision yet.    Leukocytosis: Improved.  Hyponatremia: Improved.       Discharge Diagnoses:  Principal Problem:   Sepsis due to  pneumonia [facility]) Active Problems:   Metabolic encephalopathy   BPH (benign prostatic hyperplasia)   Alzheimer's dementia   Demand ischemia (HCC)   Transaminitis   Acute respiratory failure with hypoxemia (HCC)   Pneumonia of both lungs due to infectious organism   Goals of care, counseling/discussion   Palliative care encounter   SOB (shortness of breath)   Pressure injury of skin   Essential hypertension    Discharge Instructions  Discharge Instructions    Diet general   Complete by:  As directed    Tube feeding diet   Increase activity slowly   Complete by:  As directed      Allergies as of 06/23/2017   No Known Allergies     Medication List    STOP taking these medications   acetaminophen 325 MG tablet Commonly known as:  TYLENOL   benzonatate 100 MG capsule Commonly known as:  TESSALON   multivitamin with minerals Tabs tablet   tamsulosin 0.4 MG Caps capsule Commonly known as:  FLOMAX   vitamin C 500 MG tablet Commonly known as:  ASCORBIC ACID     TAKE these medications   albuterol 108 (90 Base) MCG/ACT inhaler Commonly known as:  PROVENTIL HFA;VENTOLIN HFA Inhale 2 puffs into the lungs every 4 (four) hours as needed for wheezing or shortness of breath (or cough).   chlorhexidine 0.12 % solution Commonly known as:  PERIDEX 15 mLs by Mouth Rinse route 2 (two) times daily.   enoxaparin 40 MG/0.4ML injection Commonly known as:  LOVENOX Inject 0.4 mLs (40 mg total) into the skin daily.   famotidine 20-0.9 MG/50ML-% Commonly known as:  PEPCID Inject 50 mLs (20 mg total) into the vein every 12 (twelve) hours.   feeding supplement (OSMOLITE 1.2 CAL) Liqd Place 1,000 mLs into feeding tube daily.   free water Soln Place 50 mLs into feeding tube every 4 (four) hours.   hydrALAZINE 20 MG/ML injection Commonly known as:  APRESOLINE Inject 0.25 mLs (5 mg total) into the vein every 4 (four) hours as needed (SBP > 150 mm Hg).   insulin aspart 100 UNIT/ML  injection Commonly known as:  novoLOG Inject 0-9 Units into the skin 3 (three) times daily with meals.   levalbuterol 0.63 MG/3ML nebulizer solution Commonly known as:  XOPENEX Take 3 mLs (0.63 mg total) by nebulization 2 (two) times daily.   levalbuterol 0.63 MG/3ML nebulizer solution Commonly known as:  XOPENEX Take 3 mLs (0.63 mg total) by nebulization every 6 (six) hours as needed for wheezing or shortness of breath.   metoprolol tartrate 5 MG/5ML Soln injection Commonly known as:  LOPRESSOR Inject 2.5 mLs (2.5 mg total) into the vein every 6 (six) hours.   morphine 4 MG/ML injection Inject 0.5 mLs (2 mg total) into the vein every hour as needed for severe pain (SOB).   mouth rinse Liqd solution 15 mLs by Mouth Rinse route 2 times daily at 12 noon and 4 pm.   polyvinyl alcohol 1.4 % ophthalmic solution Commonly known as:  LIQUIFILM TEARS Place 1 drop into both eyes as needed for dry eyes.   scopolamine 1 MG/3DAYS Commonly known as:  TRANSDERM-SCOP Place 1 patch (1.5 mg total) onto the skin every 3 (three) days. Start taking on:  06/24/2017       No Known Allergies  Consultations: Gastroenterology, general surgery, IR  Procedures/Studies: Dg Chest 1 View  Result Date: 06/21/2017 CLINICAL DATA:  Follow-up bilateral airspace disease. EXAM: CHEST 1 VIEW COMPARISON:  Chest x-ray of June 18, 2017 FINDINGS: The lungs are adequately inflated. Known known bilateral posterior layering pleural effusions are not clearly evident on this AP portable exam. The interstitial markings are coarse. There are confluent lung markings at both bases. The retrocardiac region remains dense and the left hemidiaphragm obscured. The cardiac silhouette is normal in size. The pulmonary vascularity is not clearly engorged. There is calcification in the wall of the aortic arch. There is deformity of the proximal right humerus consistent with an ununited humeral neck fracture. IMPRESSION: Bibasilar  atelectasis or pneumonia. Known bilateral pleural effusions. Mild interstitial edema without significant pulmonary vascular congestion. The feeding tube tip projects below the inferior margin of the image likely in the duodenum. Thoracic aortic atherosclerosis. Electronically Signed   By: David  Martinique M.D.   On: 06/21/2017 12:36   Dg Chest 2 View  Result Date: 06/18/2017 CLINICAL DATA:  84 year old male with dyspnea EXAM: CHEST  2 VIEW COMPARISON:  Most recent prior chest x-ray 06/12/2017 FINDINGS: A weighted tip enteric feeding tube is present. The tip of the tube overlies the gastric fundus. Right upper extremity approach PICC is well position with the tip overlying the superior cavoatrial junction. Stable cardiac and mediastinal contours. Atherosclerotic calcifications present in the transverse aorta. Small bilateral pleural effusions with associated bibasilar atelectasis versus infiltrate. Pleuroparenchymal scarring is present in the periphery of the left upper lobe. Chronic background bronchitic changes are stable. No acute osseous abnormality. IMPRESSION: 1. The tip of the enteric feeding tube projects over the gastric antrum. Recommend advancing if post pyloric feeding is desired. 2. Well-positioned PICC. 3. Improved pulmonary aeration compared to 06/12/2017 likely secondary to decreased interstitial edema. 4. Persistent small bilateral pleural effusions and associated bibasilar atelectasis. Electronically Signed   By: Jacqulynn Cadet M.D.   On: 06/18/2017 09:59   Dg Chest 2 View  Result Date: 06/04/2017 CLINICAL DATA:  Shortness of breath, cough, congestion EXAM: CHEST  2 VIEW COMPARISON:  10/17/2016 FINDINGS: Patchy bilateral airspace disease has worsened since prior study concerning for pneumonia. Heart is normal size. No visible effusions or acute bony abnormality. Old left humeral neck fracture, stable. IMPRESSION: Worsening patchy bilateral airspace disease, right greater than left concerning  for pneumonia. Electronically Signed   By: Rolm Baptise M.D.   On: 06/04/2017 10:35   Ct Head Wo Contrast  Result Date: 06/14/2017 CLINICAL DATA:  Altered level of consciousness.  Sepsis. EXAM: CT HEAD WITHOUT CONTRAST TECHNIQUE: Contiguous axial images were obtained from the base of the skull through the vertex without intravenous contrast. COMPARISON:  10/17/2016 FINDINGS: Brain: No acute intracranial abnormalities. No abnormal extra-axial fluid collection, intracranial hemorrhage or mass. There is no evidence for acute intracranial hemorrhage, mass or mass effect. No evidence for acute brain infarct.There is mild diffuse low-attenuation within the subcortical and periventricular white matter compatible with chronic microvascular disease. Chronic lacunar infarcts are identified within the basal ganglia and brainstem. Similar to previous exam. Prominence of the sulci and ventricles identified compatible with atrophy. Vascular: No hyperdense vessel or unexpected calcification. Skull: Normal. Negative for fracture or focal lesion. Sinuses/Orbits: No acute finding. Other: None. IMPRESSION: 1. No acute intracranial abnormalities. 2. Advanced chronic small vessel ischemic change and brain atrophy. Electronically Signed   By: Kerby Moors M.D.   On: 06/14/2017 16:41   Ct Chest W Contrast  Result Date: 06/14/2017 CLINICAL DATA:  Acute respiratory failure and sepsis secondary to pneumonia. EXAM: CT CHEST, ABDOMEN, AND PELVIS WITH CONTRAST TECHNIQUE: Multidetector CT imaging of the chest, abdomen and pelvis was performed following the standard protocol during bolus administration of intravenous contrast. CONTRAST:  152mL ISOVUE-300 IOPAMIDOL (ISOVUE-300) INJECTION 61% COMPARISON:  CT abdomen pelvis dated Oct 18, 2012. FINDINGS: CT CHEST FINDINGS Cardiovascular: Right-sided PICC line with the tip at the cavoatrial junction. Normal heart size. No pericardial effusion. Normal caliber thoracic aorta. Coronary, aortic  arch, and branch vessel atherosclerotic vascular disease. No central pulmonary embolism. Mediastinum/Nodes: No enlarged mediastinal, hilar, or axillary lymph nodes. Thyroid gland, trachea, and esophagus demonstrate no significant findings. Lungs/Pleura: Secretions within the right mainstem bronchus extending into the right lower lobe airways. Diffuse interlobular septal thickening. Mild ground-glass density in the left greater than right upper lobes. Tree-in-bud opacities in the right lung apex. Scattered small cavitary nodules in both lungs. Scarring and bronchiectasis in the lingula. Scattered pulmonary parenchymal cysts. Moderate to large bilateral pleural effusions with bibasilar atelectasis. Areas of nonenhancing consolidation within the right lower lobe likely reflect pneumonia. Musculoskeletal: Bilateral gynecomastia. No acute or suspicious osseous findings. Degenerative changes of the thoracic spine. CT ABDOMEN PELVIS FINDINGS Hepatobiliary: No focal liver abnormality is seen. No gallstones, gallbladder wall thickening, or biliary dilatation. Pancreas: Unremarkable. No pancreatic ductal dilatation or surrounding inflammatory changes. Spleen: Normal in size without focal abnormality. Adrenals/Urinary Tract: Adrenal glands are unremarkable. Kidneys are normal, without renal calculi, focal lesion, or hydronephrosis. Bladder is distended and mildly thick-walled. Stomach/Bowel: Enteric tube with the tip in the gastric body. The stomach is within normal limits. No bowel wall thickening, obstruction, or surrounding inflammatory changes. Oral contrast within the colon. Normal appendix. Vascular/Lymphatic: Aortic atherosclerosis. No enlarged abdominal or pelvic lymph nodes. Reproductive: Markedly enlarged prostate gland, indenting the bladder base. Other: No free fluid or pneumoperitoneum. Musculoskeletal: No acute or suspicious osseous findings. Severe degenerative changes of the lumbar spine are similar to prior  study. IMPRESSION: CT chest: 1. Scattered small cavitary nodules in both lungs with tree-in-bud nodularity in the right lung apex, suspicious for multifocal cavitary pneumonia. Mycobacterial organisms could cause both cavitary pneumonia and tree-in-bud nodularity, although the tree-in-bud nodularity could reflect atypical infection superimposed on cavitary pneumonia. Given the cavitary nodules, septic emboli could have a similar appearance. 2. Additional nonenhancing consolidation within the right lower also likely reflects pneumonia. 3. Moderate to large bilateral pleural effusions with bibasilar atelectasis. Secretions within the right mainstem bronchus and lower lobe bronchi also contribute to decreased aeration in the right lower lobe. 4. Diffuse interlobular septal thickening with areas of ground-glass density in the left greater than right upper lobes, likely reflecting mild alveolar and interstitial pulmonary edema. 5.  Aortic atherosclerosis (ICD10-I70.0). CT abdomen and pelvis: 1.  No acute intra-abdominal process. 2. Markedly enlarged prostate gland with evidence of chronic bladder outlet obstruction. Electronically Signed   By: Titus Dubin M.D.   On: 06/14/2017 16:59   Dg Chest Bilateral Decubitus  Result Date: 06/18/2017 CLINICAL DATA:  Shortness of breath. EXAM: CHEST - BILATERAL DECUBITUS VIEW COMPARISON:  Chest CT 06/14/2017.  Chest x-ray 06/18/2017. FINDINGS: Very small layering bilateral effusions noted on decubitus views, likely not sufficient to tap. IMPRESSION: Suspect very small layering bilateral effusions. Electronically Signed   By: Rolm Baptise M.D.   On: 06/18/2017 09:58   Ct Abdomen Pelvis W Contrast  Result Date: 06/14/2017 CLINICAL DATA:  Acute respiratory failure and sepsis secondary to pneumonia. EXAM: CT CHEST,  ABDOMEN, AND PELVIS WITH CONTRAST TECHNIQUE: Multidetector CT imaging of the chest, abdomen and pelvis was performed following the standard protocol during bolus  administration of intravenous contrast. CONTRAST:  141mL ISOVUE-300 IOPAMIDOL (ISOVUE-300) INJECTION 61% COMPARISON:  CT abdomen pelvis dated Oct 18, 2012. FINDINGS: CT CHEST FINDINGS Cardiovascular: Right-sided PICC line with the tip at the cavoatrial junction. Normal heart size. No pericardial effusion. Normal caliber thoracic aorta. Coronary, aortic arch, and branch vessel atherosclerotic vascular disease. No central pulmonary embolism. Mediastinum/Nodes: No enlarged mediastinal, hilar, or axillary lymph nodes. Thyroid gland, trachea, and esophagus demonstrate no significant findings. Lungs/Pleura: Secretions within the right mainstem bronchus extending into the right lower lobe airways. Diffuse interlobular septal thickening. Mild ground-glass density in the left greater than right upper lobes. Tree-in-bud opacities in the right lung apex. Scattered small cavitary nodules in both lungs. Scarring and bronchiectasis in the lingula. Scattered pulmonary parenchymal cysts. Moderate to large bilateral pleural effusions with bibasilar atelectasis. Areas of nonenhancing consolidation within the right lower lobe likely reflect pneumonia. Musculoskeletal: Bilateral gynecomastia. No acute or suspicious osseous findings. Degenerative changes of the thoracic spine. CT ABDOMEN PELVIS FINDINGS Hepatobiliary: No focal liver abnormality is seen. No gallstones, gallbladder wall thickening, or biliary dilatation. Pancreas: Unremarkable. No pancreatic ductal dilatation or surrounding inflammatory changes. Spleen: Normal in size without focal abnormality. Adrenals/Urinary Tract: Adrenal glands are unremarkable. Kidneys are normal, without renal calculi, focal lesion, or hydronephrosis. Bladder is distended and mildly thick-walled. Stomach/Bowel: Enteric tube with the tip in the gastric body. The stomach is within normal limits. No bowel wall thickening, obstruction, or surrounding inflammatory changes. Oral contrast within the  colon. Normal appendix. Vascular/Lymphatic: Aortic atherosclerosis. No enlarged abdominal or pelvic lymph nodes. Reproductive: Markedly enlarged prostate gland, indenting the bladder base. Other: No free fluid or pneumoperitoneum. Musculoskeletal: No acute or suspicious osseous findings. Severe degenerative changes of the lumbar spine are similar to prior study. IMPRESSION: CT chest: 1. Scattered small cavitary nodules in both lungs with tree-in-bud nodularity in the right lung apex, suspicious for multifocal cavitary pneumonia. Mycobacterial organisms could cause both cavitary pneumonia and tree-in-bud nodularity, although the tree-in-bud nodularity could reflect atypical infection superimposed on cavitary pneumonia. Given the cavitary nodules, septic emboli could have a similar appearance. 2. Additional nonenhancing consolidation within the right lower also likely reflects pneumonia. 3. Moderate to large bilateral pleural effusions with bibasilar atelectasis. Secretions within the right mainstem bronchus and lower lobe bronchi also contribute to decreased aeration in the right lower lobe. 4. Diffuse interlobular septal thickening with areas of ground-glass density in the left greater than right upper lobes, likely reflecting mild alveolar and interstitial pulmonary edema. 5.  Aortic atherosclerosis (ICD10-I70.0). CT abdomen and pelvis: 1.  No acute intra-abdominal process. 2. Markedly enlarged prostate gland with evidence of chronic bladder outlet obstruction. Electronically Signed   By: Titus Dubin M.D.   On: 06/14/2017 16:59   Dg Chest Port 1 View  Result Date: 06/12/2017 CLINICAL DATA:  Dyspnea. EXAM: PORTABLE CHEST 1 VIEW COMPARISON:  Radiograph of June 10, 2017. FINDINGS: Stable cardiomediastinal silhouette. Atherosclerosis of thoracic aorta is noted. Feeding tube is unchanged in position. Right-sided PICC line is unchanged. No pneumothorax is noted. Stable bilateral lung opacities are noted  concerning for edema or pneumonia, with stable bibasilar atelectasis and effusions. Bony thorax is unremarkable. IMPRESSION: Aortic atherosclerosis. Stable support apparatus. Stable bilateral lung opacities concerning for edema or pneumonia with probable bibasilar atelectasis and effusions. Electronically Signed   By: Marijo Conception, M.D.   On: 06/12/2017  07:16   Dg Chest Port 1 View  Result Date: 06/10/2017 CLINICAL DATA:  Feeding tube placement. EXAM: CHEST 1 VIEW COMPARISON:  06/05/2017 FINDINGS: The feeding tube tip is in the body region of the stomach. Right PICC line tip is in the distal SVC. Persistent diffuse interstitial process and pleural effusions which could represent edema or infiltrates. The left upper lobe consolidation has significantly improved. IMPRESSION: The feeding tube tip is in the body region of the stomach. Right PICC line tip is in the distal SVC. Improving/resolving left upper lobe pneumonia. Persistent diffuse interstitial process, effusions and basilar atelectasis. Electronically Signed   By: Marijo Sanes M.D.   On: 06/10/2017 19:03   Dg Chest Port 1 View  Result Date: 06/05/2017 CLINICAL DATA:  84 year old male with acute respiratory failure and hypoxia. Alzheimer's. Subsequent encounter. EXAM: PORTABLE CHEST 1 VIEW COMPARISON:  06/04/2017 chest x-ray. FINDINGS: Asymmetric airspace disease now greatest in the left perihilar region. No pneumothorax. Heart size within normal limits. Calcified aorta. No acute osseous abduct IMPRESSION: Asymmetric airspace disease now greatest in the left perihilar region. This may represent asymmetric pulmonary edema although infectious process also possible in the proper clinical setting. Aortic Atherosclerosis (ICD10-I70.0). Electronically Signed   By: Genia Del M.D.   On: 06/05/2017 10:40   Dg Abd Portable 1v  Result Date: 06/22/2017 CLINICAL DATA:  Evaluate for NG tube placement. EXAM: PORTABLE ABDOMEN - 1 VIEW COMPARISON:  06/22/2017  FINDINGS: Feeding catheter containing introducer terminates in the expected location of gastric body Nonspecific bowel gas pattern.  Vascular calcifications are noted. IMPRESSION: Feeding catheter tip in the expected location of gastric body. These results will be called to the ordering clinician or representative by the Radiologist Assistant, and communication documented in the PACS or zVision Dashboard. Electronically Signed   By: Fidela Salisbury M.D.   On: 06/22/2017 17:09   Dg Abd Portable 1v  Result Date: 06/22/2017 CLINICAL DATA:  Check feeding tube EXAM: PORTABLE ABDOMEN - 1 VIEW COMPARISON:  Fluoroscopic images dated 06/19/2016 FINDINGS: Weighted feeding tube terminates in the proximal 2nd portion of the duodenum. Nonobstructive bowel gas pattern. Degenerative changes the lumbar spine. IMPRESSION: Weighted feeding tube terminates in the proximal 2nd portion of the duodenum. Electronically Signed   By: Julian Hy M.D.   On: 06/22/2017 10:49   Dg Abd Portable 1v  Result Date: 06/10/2017 CLINICAL DATA:  Feeding tube placement. EXAM: ABDOMEN - 1 VIEW COMPARISON:  None. FINDINGS: The feeding tube tip is in the body region of the stomach. The bowel gas pattern is unremarkable. The bony structures are intact. IMPRESSION: Feeding tube tip in the body region of the stomach. Electronically Signed   By: Marijo Sanes M.D.   On: 06/10/2017 19:03   Dg Addison Bailey G Tube Plc W/fl W/rad  Result Date: 06/19/2017 CLINICAL DATA:  Aspiration, advancement of feeding to EXAM: NASO G TUBE PLACEMENT WITH FL AND WITH RAD CONTRAST:  Air-contrast FLUOROSCOPY TIME:  Fluoroscopy Time:  5.1 minutes Radiation Exposure Index (if provided by the fluoroscopic device): 65.1 mGy Number of Acquired Spot Images: 1 COMPARISON:  CT 1179 FINDINGS: Under fluoroscopic observation a guide wire was advanced through the length of the feeding tube. Feeding tube was advanced into the third portion duodenum. Guidewire was retracted.  Feeding tube flushed with 60 cc water. Initially tube was clogged with material but subsequently freed. IMPRESSION: Advancement of feeding tube to the third portion the duodenum. Electronically Signed   By: Suzy Bouchard M.D.   On:  06/19/2017 13:29   Dg Swallowing Func-speech Pathology  Result Date: 06/13/2017 Objective Swallowing Evaluation: Type of Study: MBS-Modified Barium Swallow Study  Patient Details Name: Chimaobi Casebolt MRN: [id number] Date of Birth: [birth year] Today's Date: 06/13/2017 Time: SLP Start Time (ACUTE ONLY): 81 -SLP Stop Time (ACUTE ONLY): 1159 SLP Time Calculation (min) (ACUTE ONLY): 29 min Past Medical History: Past Medical History: Diagnosis Date . AKI (acute kidney injury) ([place])  . Alzheimers disease  . Anemia of chronic disease  . Aspiration pneumonia of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

## 2017-06-23 NOTE — Care Management Important Message (Signed)
Important Message  Patient Details  Name: Douglas Mckee MRN: [id number] Date of Birth: 10-Nov-1922   Medicare Important Message Given:  Yes    Erenest Rasher, RN 06/23/2017, 3:30 PM

## 2017-06-23 NOTE — Progress Notes (Signed)
NCM contacted [facility] and spoke to [place]. States they do have bed availability. Will be able to accept pt today. She will call with information on physician and bed number. Jonnie Finner RN CCM Case Mgmt phone [phone]

## 2017-06-23 NOTE — Care Management Note (Addendum)
Case Management Note  Patient Details  Name: Douglas Mckee MRN: [id number] Date of Birth: 08-Apr-1923  Subjective/Objective:    Acute resp failure with hypoxia, bilateral pneumonia, metabolic encephalopathy                Action/Plan: NCM spoke to pt's daughter, Douglas Mckee at bedside. Dtr states she does not want pt to go to [facility]. Notified [facility] rep. [place] to make aware. She and family will consider [facility]. Contacted [facility] and they do have a bed available. [place] will go to tour and speak to [facility] rep today.    06/23/2017 1642 Spoke to pt's grandson, Douglas Mckee and are agreeable to [facility]. Contacted [facility] to facilitate. Received call back from admission coordinator, Douglas Mckee. [facility] will work on referral and notify NCM.   PCP Dell Ponto L   Expected Discharge Date:  06/23/17               Expected Discharge Plan:  [facility] Care (LT[facility])  In-House Referral:  Clinical Social Work  Discharge planning Services  CM Consult  Post Acute Care Choice:  NA Choice offered to:  NA  DME Arranged:  N/A DME Agency:  NA  HH Arranged:  NA HH Agency:  NA  Status of Service:  Completed, signed off  If discussed at [employer] of Stay Meetings, dates discussed:    Additional Comments:  Erenest Rasher, RN 06/23/2017, 3:02 PM

## 2017-06-23 NOTE — Care Management Note (Signed)
Case Management Note  Patient Details  Name: Douglas Mckee MRN: [id number] Date of Birth: 1923-04-25  Subjective/Objective:  See previous NCM notes                  Action/Plan: Spoke to [facility], [place]. Pt bed number 217, call report to [phone]. Accepting physician Dr Vista Lawman. Will need a copy of dc summary to go with transfer paperwork. Made Unit RN aware of [facility] transport, EMTALA, and provided number to call report. Notified attending.     Expected Discharge Date: 06/23/2017             Expected Discharge Plan:  Long Term Acute Care (LTAC)  In-House Referral:  Clinical Social Work  Discharge planning Services  CM Consult  Post Acute Care Choice:  NA Choice offered to:  NA  DME Arranged:  N/A DME Agency:  NA  HH Arranged:  NA HH Agency:  NA  Status of Service:  Completed, signed off  If discussed at [employer] of Stay Meetings, dates discussed:    Additional Comments:  Erenest Rasher, RN 06/23/2017, 12:15 PM

## 2017-06-23 NOTE — Progress Notes (Addendum)
Per Dr. Nancy Marus for patient  to discharge to [facility] with PICC line in place.

## 2017-06-24 LAB — GLUCOSE, CAPILLARY
GLUCOSE-CAPILLARY: 126 mg/dL — AB (ref 65–99)
GLUCOSE-CAPILLARY: 150 mg/dL — AB (ref 65–99)
GLUCOSE-CAPILLARY: 167 mg/dL — AB (ref 65–99)
Glucose-Capillary: 136 mg/dL — ABNORMAL HIGH (ref 65–99)
Glucose-Capillary: 118 mg/dL — ABNORMAL HIGH (ref 65–99)

## 2017-06-24 NOTE — Progress Notes (Signed)
Discharge Planning: NCM received message from [place] admission coordinator, LTAC will not be able confirm admission until Monday, 06/25/2017.  Jonnie Finner RN CCM Case Mgmt phone [phone]

## 2017-06-24 NOTE — Progress Notes (Addendum)
PROGRESS NOTE    Douglas Mckee  BMW:[id number] DOB: 11/04/1922 DOA: 06/04/2017 PCP: Verline Lema, MD    Patient is a 84 year old male with past medical history of BPH, hypertension, diabetes, TIA, dementia, recurrent pneumonia who presented to the emergency department with worsening dyspnea, vomiting. He was found to be febrile, tachycardic with elevated lactic acid and leukocytosis. Chest x-ray showed bilateral airspace disease. Suspected to be from aspiration pneumonia. He was started on IV antibiotics. Critical care was consulted. Patient was admitted on stepdown unit. After his  respiratory status stabilized , he wastransferred to hospital service. Currently his  overall condition is stable .Patient has chronic issues with aspiration .He is unable to swallow and currently on tube feeding .   We discussed multiple times with family about goals of care .Palliative care was also on board.  Though he is DNI/DNR, family did not want to initiate comfort care or recreational feeding and wished to continue tube feeding.  We discussed the options of placing PEG tube or G-tube for feeding.  Patient was evaluated by IR, gastroenterology and general surgery.  He was finally determined not to be a candidate for this procedures. Since patient's family desired to continue tube feeding, he he has been planned for discharge to [place].This is the only discharge option we had.   Following problems were addressed during his hospitalization:  Sepsis: Resolved. Currently he is hemodynamically stable.   He completed the antibiotics course.  Acute respiratory failure with hypoxia: Was initially started on BiPAP. Currently on nasal cannula. Oxygen should be  weaned off as tolerated. Family desires to continue medical care. Currently he is DNI/DNR. Steroids stopped  Bilateral pneumonia: Suspected to be from aspiration. Patient initially completed treatment with Zosyn for 7 days. CT scan of the chest  done on 06/14/17 showed multifocal cavitary pneumonia with concern for atypical organisms for septic emboli as well as moderate to large bilateral pleural effusions. Patient was started on vancomycin and Zosynon 06/14/17. Vancomycin discontinued on 06/17/17 as there is no evidence of MRSA infection. Zosyn also discontinued  2 days ago. Since he has recurrent pneumonia, we have discussed with family members multiple times about possible hospice/comfort measures. They have not decided yet. They agree upon PEG/G tube  placement. IR was consulted but they said since his transverse colon is infrontof the stomach on his latest CAT scan,it is not possible through IR.  Gastroenterology said the same.  Surgery evaluated the patient and thought that he is a high risk candidate for any intervention.  There is no option besides continue tube feeding. Last chest x-ray done on 06/21/17 was suggestive  of bibasilar atelectasis.  Clogged NG tube:New  Tube put on 06/22/17  Transaminitis with cholelithiasis: LFTs improved.  Metabolic encephalopathy: Mental status has been improving. Does not communicate at present. As per the family, he was walking and talking before he was admitted.  Hypertension: Continue current medications. We will continue to monitor blood pressure.  Multiple Comorbidities, deconditioning, poor prognosis: Palliative care was following. Patient's family members did not not want any further input from palliative care team. Family has been addressed several times about discussion on hospice/comfort measures. Family members not ready to make that decision yet.   Leukocytosis: Improved.  Hyponatremia: Improved      DVT prophylaxis: Lovenox Code Status: DNR Family Communication: Family present at the bedside Disposition Plan: LTAC   Consultants: None  Procedures:None  Antimicrobials:None at present .  Finished the course of vancomycin ,zosyn  Subjective: Remains  comfortable.  No new issues.  NG tube changed yesterday.  Objective: Vitals:   06/23/17 1727 06/23/17 1948 06/23/17 2212 06/24/17 0600  BP: (!) 191/84 (!) 104/56  138/64  Pulse: (!) 103 85  80  Resp:  18  16  Temp:  98.5 F (36.9 C)  98.1 F (36.7 C)  TempSrc:  Axillary  Axillary  SpO2:  100% 95% 96%  Weight:    65.8 kg (145 lb)  Height:       No intake or output data in the 24 hours ending 06/24/17 1204 Filed Weights   06/22/17 0430 06/23/17 0637 06/24/17 0600  Weight: 62 kg (136 lb 11 oz) 65.8 kg (145 lb 1 oz) 65.8 kg (145 lb)    Examination: General exam: Appears calm  ,Not in distress, elderly cachectic male, chronically ill,NG tube Respiratory system: Bilateral decreased air entry on the bases cardiovascular system: S1 & S2 heard, RRR. No JVD, murmurs, rubs, gallops or clicks. No pedal edema. Gastrointestinal system: Abdomen is nondistended, soft and nontender. No organomegaly or masses felt. Normal bowel sounds heard. Central nervous system: Not Alert and oriented. No focal neurological deficits. Extremities: No edema, no clubbing ,no cyanosis, distal peripheral pulses palpable. Skin: No cyanosis,No pallor,No Rash,No Ulcer       Data Reviewed: I have personally reviewed following labs and imaging studies  CBC: Recent Labs  Lab 06/18/17 0537 06/19/17 0640 06/20/17 0549 06/21/17 0500 06/22/17 0420  WBC 20.4* 18.3* 13.7* 11.4* 10.9*  NEUTROABS 17.4* 15.1* 11.4* 9.6* 8.9*  HGB 10.7* 10.4* 9.0* 8.9* 8.6*  HCT 33.7* 33.2* 28.9* 27.8* 27.2*  MCV 72.5* 73.3* 73.7* 72.6* 72.5*  PLT 323 365 344 327 810   Basic Metabolic Panel: Recent Labs  Lab 06/18/17 0537 06/19/17 0640 06/20/17 0549 06/21/17 0500 06/22/17 0420  NA 143 143 142 140 139  K 3.5 3.4* 3.1* 3.4* 4.2  CL 91* 94* 95* 99* 101  CO2 41* 41* 41* 35* 34*  GLUCOSE 119* 103* 294* 156* 166*  BUN 39* 41* 39* 27* 25*  CREATININE 1.17 1.15 1.05 0.77 0.67  CALCIUM 8.5* 8.5* 7.9* 8.2* 8.1*  MG 2.4 2.5*  2.3  --   --    GFR: Estimated Creatinine Clearance: 51.3 mL/min (by C-G formula based on SCr of 0.67 mg/dL). Liver Function Tests: No results for input(s): AST, ALT, ALKPHOS, BILITOT, PROT, ALBUMIN in the last 168 hours. No results for input(s): LIPASE, AMYLASE in the last 168 hours. No results for input(s): AMMONIA in the last 168 hours. Coagulation Profile: No results for input(s): INR, PROTIME in the last 168 hours. Cardiac Enzymes: No results for input(s): CKTOTAL, CKMB, CKMBINDEX, TROPONINI in the last 168 hours. BNP (last 3 results) No results for input(s): PROBNP in the last 8760 hours. HbA1C: No results for input(s): HGBA1C in the last 72 hours. CBG: Recent Labs  Lab 06/23/17 0749 06/23/17 1155 06/23/17 1734 06/24/17 0008 06/24/17 0742  GLUCAP 148* 116* 131* 167* 136*   Lipid Profile: No results for input(s): CHOL, HDL, LDLCALC, TRIG, CHOLHDL, LDLDIRECT in the last 72 hours. Thyroid Function Tests: No results for input(s): TSH, T4TOTAL, FREET4, T3FREE, THYROIDAB in the last 72 hours. Anemia Panel: No results for input(s): VITAMINB12, FOLATE, FERRITIN, TIBC, IRON, RETICCTPCT in the last 72 hours. Sepsis Labs: No results for input(s): PROCALCITON, LATICACIDVEN in the last 168 hours.  Recent Results (from the past 240 hour(s))  Culture, blood (routine x 2)     Status: None   Collection Time: 06/16/17  8:00  AM  Result Value Ref Range Status   Specimen Description BLOOD BLOOD RIGHT FOREARM  Final   Special Requests IN PEDIATRIC BOTTLE Blood Culture adequate volume  Final   Culture   Final    NO GROWTH 5 DAYS Performed at [facility], [place] [address], [place], [place] [address]    Report Status 06/21/2017 FINAL  Final  Culture, blood (routine x 2)     Status: None   Collection Time: 06/16/17  8:12 AM  Result Value Ref Range Status   Specimen Description BLOOD BLOOD LEFT HAND  Final   Special Requests IN PEDIATRIC BOTTLE Blood Culture adequate volume  Final     Culture   Final    NO GROWTH 5 DAYS Performed at [facility], [place] [address] [place], [place] [address]    Report Status 06/21/2017 FINAL  Final         Radiology Studies: Dg Abd Portable 1v  Result Date: 06/22/2017 CLINICAL DATA:  Evaluate for NG tube placement. EXAM: PORTABLE ABDOMEN - 1 VIEW COMPARISON:  06/22/2017 FINDINGS: Feeding catheter containing introducer terminates in the expected location of gastric body Nonspecific bowel gas pattern.  Vascular calcifications are noted. IMPRESSION: Feeding catheter tip in the expected location of gastric body. These results will be called to the ordering clinician or representative by the Radiologist Assistant, and communication documented in the PACS or zVision Dashboard. Electronically Signed   By: Fidela Salisbury M.D.   On: 06/22/2017 17:09        Scheduled Meds: . chlorhexidine  15 mL Mouth Rinse BID  . enoxaparin (LOVENOX) injection  40 mg Subcutaneous Q24H  . feeding supplement (OSMOLITE 1.2 CAL)  1,000 mL Per Tube Q24H  . free water  50 mL Per Tube Q4H  . insulin aspart  0-9 Units Subcutaneous TID WC  . levalbuterol  0.63 mg Nebulization BID  . lipase/protease/amylase  24,000 Units Oral Once   And  . sodium bicarbonate  650 mg Oral Once  . mouth rinse  15 mL Mouth Rinse q12n4p  . metoprolol tartrate  2.5 mg Intravenous Q6H  . scopolamine  1 patch Transdermal Q72H  . sodium chloride flush  10-40 mL Intracatheter Q12H   Continuous Infusions: . dextrose 5 % and 0.45% NaCl 50 mL/hr at 06/24/17 0603  . famotidine (PEPCID) IV Stopped (06/24/17 0930)  . sodium chloride       LOS: 20 days    Time spent: 25 mins   Laela Deviney Jodie Echevaria, MD [facility] Pager [phone]  If 7PM-7AM, please contact night-coverage [email or web address] Password [facility] 06/24/2017, 12:04 PM

## 2017-06-24 NOTE — Progress Notes (Addendum)
PROGRESS NOTE    Douglas Mckee  EGB:[id number] DOB: 29-May-1923 DOA: 06/04/2017 PCP: Verline Lema, MD    Patient is a 84 year old male with past medical history of BPH, hypertension, diabetes, TIA, dementia, recurrent pneumonia who presented to the emergency department with worsening dyspnea, vomiting. He was found to be febrile, tachycardic with elevated lactic acid and leukocytosis. Chest x-ray showed bilateral airspace disease. Suspected to be from aspiration pneumonia. He was started on IV antibiotics. Critical care was consulted. Patient was admitted on stepdown unit. After his  respiratory status stabilized , he wastransferred to hospital service. Currently his  overall condition is stable .Patient has chronic issues with aspiration .He is unable to swallow and currently on tube feeding .   We discussed multiple times with family about goals of care .Palliative care was also on board.  Though he is DNI/DNR, family did not want to initiate comfort care or recreational feeding and wished to continue tube feeding.  We discussed the options of placing PEG tube or G-tube for feeding.  Patient was evaluated by IR, gastroenterology and general surgery.  He was finally determined not to be a candidate for this procedures. Since patient's family desired to continue tube feeding, he he has been planned for discharge to [place].This is the only discharge option we had. Family agreed on transferring the patient to [place]. Hopefully that will happen tomorrow.   Following problems were addressed during his hospitalization:  Sepsis: Resolved. Currently he is hemodynamically stable.   He completed the antibiotics course.  Acute respiratory failure with hypoxia: Was initially started on BiPAP. Currently on nasal cannula. Oxygen should be  weaned off as tolerated. Family desires to continue medical care. Currently he is DNI/DNR. Steroids stopped  Bilateral pneumonia: Suspected to be from  aspiration. Patient initially completed treatment with Zosyn for 7 days. CT scan of the chest done on 06/14/17 showed multifocal cavitary pneumonia with concern for atypical organisms for septic emboli as well as moderate to large bilateral pleural effusions. Patient was started on vancomycin and Zosynon 06/14/17. Vancomycin discontinued on 06/17/17 as there is no evidence of MRSA infection. Zosyn also discontinued  2 days ago. Since he has recurrent pneumonia, we have discussed with family members multiple times about possible hospice/comfort measures. They have not decided yet. They agree upon PEG/G tube  placement. IR was consulted but they said since his transverse colon is infrontof the stomach on his latest CAT scan,it is not possible through IR.  Gastroenterology said the same.  Surgery evaluated the patient and thought that he is a high risk candidate for any intervention.  There is no option besides continue tube feeding. Last chest x-ray done on 06/21/17 was suggestive  of bibasilar atelectasis.  Clogged NG tube:New  Tube put on 06/22/17  Transaminitis with cholelithiasis: LFTs improved.  Metabolic encephalopathy: Mental status has been improving. Does not communicate at present. As per the family, he was walking and talking before he was admitted.  Hypertension: Continue current medications. We will continue to monitor blood pressure.  Multiple Comorbidities, deconditioning, poor prognosis: Palliative care was following. Patient's family members did not not want any further input from palliative care team. Family has been addressed several times about discussion on hospice/comfort measures. Family members not ready to make that decision yet.   Leukocytosis: Improved.  Hyponatremia: Improved      DVT prophylaxis: Lovenox Code Status: DNR Family Communication: Discussed with daughter at the bedside who understands the plan. Disposition Plan:  LTAC  Consultants: None  Procedures:None  Antimicrobials:None at present .  Finished the course of vancomycin and zosyn  Subjective: Remains comfortable.  No new issues.  NG tube changed yesterday.  Objective: Vitals:   06/23/17 1727 06/23/17 1948 06/23/17 2212 06/24/17 0600  BP: (!) 191/84 (!) 104/56  138/64  Pulse: (!) 103 85  80  Resp:  18  16  Temp:  98.5 F (36.9 C)  98.1 F (36.7 C)  TempSrc:  Axillary  Axillary  SpO2:  100% 95% 96%  Weight:    65.8 kg (145 lb)  Height:       No intake or output data in the 24 hours ending 06/24/17 1208 Filed Weights   06/22/17 0430 06/23/17 0637 06/24/17 0600  Weight: 62 kg (136 lb 11 oz) 65.8 kg (145 lb 1 oz) 65.8 kg (145 lb)    Examination: General exam: Appears calm  ,Not in distress, elderly cachectic male, chronically ill,NG tube Respiratory system: Bilateral decreased air entry on the bases cardiovascular system: S1 & S2 heard, RRR. No JVD, murmurs, rubs, gallops or clicks. No pedal edema. Gastrointestinal system: Abdomen is nondistended, soft and nontender. No organomegaly or masses felt. Normal bowel sounds heard. Central nervous system: Not Alert and oriented. No focal neurological deficits. Extremities: No edema, no clubbing ,no cyanosis, distal peripheral pulses palpable. Skin: No cyanosis,No pallor,No Rash,No Ulcer       Data Reviewed: I have personally reviewed following labs and imaging studies  CBC: Recent Labs  Lab 06/18/17 0537 06/19/17 0640 06/20/17 0549 06/21/17 0500 06/22/17 0420  WBC 20.4* 18.3* 13.7* 11.4* 10.9*  NEUTROABS 17.4* 15.1* 11.4* 9.6* 8.9*  HGB 10.7* 10.4* 9.0* 8.9* 8.6*  HCT 33.7* 33.2* 28.9* 27.8* 27.2*  MCV 72.5* 73.3* 73.7* 72.6* 72.5*  PLT 323 365 344 327 169   Basic Metabolic Panel: Recent Labs  Lab 06/18/17 0537 06/19/17 0640 06/20/17 0549 06/21/17 0500 06/22/17 0420  NA 143 143 142 140 139  K 3.5 3.4* 3.1* 3.4* 4.2  CL 91* 94* 95* 99* 101  CO2 41* 41* 41* 35*  34*  GLUCOSE 119* 103* 294* 156* 166*  BUN 39* 41* 39* 27* 25*  CREATININE 1.17 1.15 1.05 0.77 0.67  CALCIUM 8.5* 8.5* 7.9* 8.2* 8.1*  MG 2.4 2.5* 2.3  --   --    GFR: Estimated Creatinine Clearance: 51.3 mL/min (by C-G formula based on SCr of 0.67 mg/dL). Liver Function Tests: No results for input(s): AST, ALT, ALKPHOS, BILITOT, PROT, ALBUMIN in the last 168 hours. No results for input(s): LIPASE, AMYLASE in the last 168 hours. No results for input(s): AMMONIA in the last 168 hours. Coagulation Profile: No results for input(s): INR, PROTIME in the last 168 hours. Cardiac Enzymes: No results for input(s): CKTOTAL, CKMB, CKMBINDEX, TROPONINI in the last 168 hours. BNP (last 3 results) No results for input(s): PROBNP in the last 8760 hours. HbA1C: No results for input(s): HGBA1C in the last 72 hours. CBG: Recent Labs  Lab 06/23/17 0749 06/23/17 1155 06/23/17 1734 06/24/17 0008 06/24/17 0742  GLUCAP 148* 116* 131* 167* 136*   Lipid Profile: No results for input(s): CHOL, HDL, LDLCALC, TRIG, CHOLHDL, LDLDIRECT in the last 72 hours. Thyroid Function Tests: No results for input(s): TSH, T4TOTAL, FREET4, T3FREE, THYROIDAB in the last 72 hours. Anemia Panel: No results for input(s): VITAMINB12, FOLATE, FERRITIN, TIBC, IRON, RETICCTPCT in the last 72 hours. Sepsis Labs: No results for input(s): PROCALCITON, LATICACIDVEN in the last 168 hours.  Recent Results (from the past 240  hour(s))  Culture, blood (routine x 2)     Status: None   Collection Time: 06/16/17  8:00 AM  Result Value Ref Range Status   Specimen Description BLOOD BLOOD RIGHT FOREARM  Final   Special Requests IN PEDIATRIC BOTTLE Blood Culture adequate volume  Final   Culture   Final    NO GROWTH 5 DAYS Performed at [facility], [place] [address] [place], [place] [address]    Report Status 06/21/2017 FINAL  Final  Culture, blood (routine x 2)     Status: None   Collection Time: 06/16/17  8:12 AM  Result  Value Ref Range Status   Specimen Description BLOOD BLOOD LEFT HAND  Final   Special Requests IN PEDIATRIC BOTTLE Blood Culture adequate volume  Final   Culture   Final    NO GROWTH 5 DAYS Performed at [facility], [place] [address] [place], [place] [address]    Report Status 06/21/2017 FINAL  Final         Radiology Studies: Dg Abd Portable 1v  Result Date: 06/22/2017 CLINICAL DATA:  Evaluate for NG tube placement. EXAM: PORTABLE ABDOMEN - 1 VIEW COMPARISON:  06/22/2017 FINDINGS: Feeding catheter containing introducer terminates in the expected location of gastric body Nonspecific bowel gas pattern.  Vascular calcifications are noted. IMPRESSION: Feeding catheter tip in the expected location of gastric body. These results will be called to the ordering clinician or representative by the Radiologist Assistant, and communication documented in the PACS or zVision Dashboard. Electronically Signed   By: Fidela Salisbury M.D.   On: 06/22/2017 17:09        Scheduled Meds: . chlorhexidine  15 mL Mouth Rinse BID  . enoxaparin (LOVENOX) injection  40 mg Subcutaneous Q24H  . feeding supplement (OSMOLITE 1.2 CAL)  1,000 mL Per Tube Q24H  . free water  50 mL Per Tube Q4H  . insulin aspart  0-9 Units Subcutaneous TID WC  . levalbuterol  0.63 mg Nebulization BID  . lipase/protease/amylase  24,000 Units Oral Once   And  . sodium bicarbonate  650 mg Oral Once  . mouth rinse  15 mL Mouth Rinse q12n4p  . metoprolol tartrate  2.5 mg Intravenous Q6H  . scopolamine  1 patch Transdermal Q72H  . sodium chloride flush  10-40 mL Intracatheter Q12H   Continuous Infusions: . dextrose 5 % and 0.45% NaCl 50 mL/hr at 06/24/17 0603  . famotidine (PEPCID) IV Stopped (06/24/17 0930)  . sodium chloride       LOS: 20 days    Time spent: 25 mins   Jaece Ducharme Jodie Echevaria, MD [facility] Pager [phone]  If 7PM-7AM, please contact night-coverage [email or web address] Password [facility] 06/24/2017,  12:08 PM

## 2017-06-25 ENCOUNTER — Inpatient Hospital Stay
Admission: RE | Admit: 2017-06-25 | Discharge: 2017-07-13 | Disposition: A | Discharge status: Hospice | Payer: Self-pay | Source: Ambulatory Visit | Attending: Internal Medicine | Admitting: Internal Medicine

## 2017-06-25 ENCOUNTER — Other Ambulatory Visit (HOSPITAL_COMMUNITY): Payer: Self-pay

## 2017-06-25 DIAGNOSIS — F0280 Dementia in other diseases classified elsewhere without behavioral disturbance: Secondary | ICD-10-CM | POA: Diagnosis present

## 2017-06-25 DIAGNOSIS — K5900 Constipation, unspecified: Secondary | ICD-10-CM | POA: Diagnosis not present

## 2017-06-25 DIAGNOSIS — J9690 Respiratory failure, unspecified, unspecified whether with hypoxia or hypercapnia: Secondary | ICD-10-CM | POA: Diagnosis not present

## 2017-06-25 DIAGNOSIS — E46 Unspecified protein-calorie malnutrition: Secondary | ICD-10-CM | POA: Diagnosis not present

## 2017-06-25 DIAGNOSIS — R339 Retention of urine, unspecified: Secondary | ICD-10-CM | POA: Diagnosis present

## 2017-06-25 DIAGNOSIS — Z8673 Personal history of transient ischemic attack (TIA), and cerebral infarction without residual deficits: Secondary | ICD-10-CM | POA: Diagnosis not present

## 2017-06-25 DIAGNOSIS — J189 Pneumonia, unspecified organism: Secondary | ICD-10-CM

## 2017-06-25 DIAGNOSIS — R739 Hyperglycemia, unspecified: Secondary | ICD-10-CM | POA: Diagnosis present

## 2017-06-25 DIAGNOSIS — J9601 Acute respiratory failure with hypoxia: Secondary | ICD-10-CM | POA: Diagnosis not present

## 2017-06-25 DIAGNOSIS — R1312 Dysphagia, oropharyngeal phase: Secondary | ICD-10-CM | POA: Diagnosis present

## 2017-06-25 DIAGNOSIS — M109 Gout, unspecified: Secondary | ICD-10-CM | POA: Diagnosis not present

## 2017-06-25 DIAGNOSIS — E871 Hypo-osmolality and hyponatremia: Secondary | ICD-10-CM | POA: Diagnosis present

## 2017-06-25 DIAGNOSIS — N179 Acute kidney failure, unspecified: Secondary | ICD-10-CM | POA: Diagnosis present

## 2017-06-25 DIAGNOSIS — Z833 Family history of diabetes mellitus: Secondary | ICD-10-CM | POA: Diagnosis not present

## 2017-06-25 DIAGNOSIS — N401 Enlarged prostate with lower urinary tract symptoms: Secondary | ICD-10-CM | POA: Diagnosis not present

## 2017-06-25 DIAGNOSIS — G309 Alzheimer's disease, unspecified: Secondary | ICD-10-CM | POA: Diagnosis present

## 2017-06-25 DIAGNOSIS — Z6823 Body mass index (BMI) 23.0-23.9, adult: Secondary | ICD-10-CM | POA: Diagnosis not present

## 2017-06-25 DIAGNOSIS — J9691 Respiratory failure, unspecified with hypoxia: Secondary | ICD-10-CM | POA: Diagnosis not present

## 2017-06-25 DIAGNOSIS — Z8249 Family history of ischemic heart disease and other diseases of the circulatory system: Secondary | ICD-10-CM | POA: Diagnosis not present

## 2017-06-25 DIAGNOSIS — E872 Acidosis: Secondary | ICD-10-CM | POA: Diagnosis not present

## 2017-06-25 DIAGNOSIS — G9341 Metabolic encephalopathy: Secondary | ICD-10-CM | POA: Diagnosis not present

## 2017-06-25 DIAGNOSIS — J690 Pneumonitis due to inhalation of food and vomit: Secondary | ICD-10-CM | POA: Diagnosis not present

## 2017-06-25 DIAGNOSIS — R531 Weakness: Secondary | ICD-10-CM | POA: Diagnosis not present

## 2017-06-25 DIAGNOSIS — E870 Hyperosmolality and hypernatremia: Secondary | ICD-10-CM | POA: Diagnosis not present

## 2017-06-25 DIAGNOSIS — L89101 Pressure ulcer of unspecified part of back, stage 1: Secondary | ICD-10-CM | POA: Diagnosis not present

## 2017-06-25 DIAGNOSIS — R6520 Severe sepsis without septic shock: Secondary | ICD-10-CM | POA: Diagnosis not present

## 2017-06-25 DIAGNOSIS — K9423 Gastrostomy malfunction: Secondary | ICD-10-CM | POA: Diagnosis not present

## 2017-06-25 DIAGNOSIS — K56609 Unspecified intestinal obstruction, unspecified as to partial versus complete obstruction: Secondary | ICD-10-CM

## 2017-06-25 DIAGNOSIS — J9600 Acute respiratory failure, unspecified whether with hypoxia or hypercapnia: Secondary | ICD-10-CM | POA: Diagnosis not present

## 2017-06-25 DIAGNOSIS — Z66 Do not resuscitate: Secondary | ICD-10-CM | POA: Diagnosis present

## 2017-06-25 DIAGNOSIS — K8020 Calculus of gallbladder without cholecystitis without obstruction: Secondary | ICD-10-CM | POA: Diagnosis not present

## 2017-06-25 DIAGNOSIS — E1165 Type 2 diabetes mellitus with hyperglycemia: Secondary | ICD-10-CM | POA: Diagnosis not present

## 2017-06-25 DIAGNOSIS — Z8701 Personal history of pneumonia (recurrent): Secondary | ICD-10-CM | POA: Diagnosis not present

## 2017-06-25 DIAGNOSIS — I248 Other forms of acute ischemic heart disease: Secondary | ICD-10-CM | POA: Diagnosis not present

## 2017-06-25 DIAGNOSIS — Z4659 Encounter for fitting and adjustment of other gastrointestinal appliance and device: Secondary | ICD-10-CM

## 2017-06-25 DIAGNOSIS — D638 Anemia in other chronic diseases classified elsewhere: Secondary | ICD-10-CM | POA: Diagnosis not present

## 2017-06-25 DIAGNOSIS — R1310 Dysphagia, unspecified: Secondary | ICD-10-CM | POA: Diagnosis not present

## 2017-06-25 DIAGNOSIS — G9340 Encephalopathy, unspecified: Secondary | ICD-10-CM | POA: Diagnosis not present

## 2017-06-25 DIAGNOSIS — Z515 Encounter for palliative care: Secondary | ICD-10-CM | POA: Diagnosis not present

## 2017-06-25 DIAGNOSIS — I679 Cerebrovascular disease, unspecified: Secondary | ICD-10-CM | POA: Diagnosis not present

## 2017-06-25 DIAGNOSIS — A419 Sepsis, unspecified organism: Secondary | ICD-10-CM | POA: Diagnosis not present

## 2017-06-25 DIAGNOSIS — Z4682 Encounter for fitting and adjustment of non-vascular catheter: Secondary | ICD-10-CM | POA: Diagnosis not present

## 2017-06-25 DIAGNOSIS — R945 Abnormal results of liver function studies: Secondary | ICD-10-CM | POA: Diagnosis present

## 2017-06-25 DIAGNOSIS — N400 Enlarged prostate without lower urinary tract symptoms: Secondary | ICD-10-CM | POA: Diagnosis present

## 2017-06-25 DIAGNOSIS — I472 Ventricular tachycardia: Secondary | ICD-10-CM | POA: Diagnosis present

## 2017-06-25 DIAGNOSIS — Z0189 Encounter for other specified special examinations: Secondary | ICD-10-CM

## 2017-06-25 DIAGNOSIS — Z9842 Cataract extraction status, left eye: Secondary | ICD-10-CM | POA: Diagnosis not present

## 2017-06-25 LAB — GLUCOSE, CAPILLARY
GLUCOSE-CAPILLARY: 132 mg/dL — AB (ref 65–99)
Glucose-Capillary: 160 mg/dL — ABNORMAL HIGH (ref 65–99)

## 2017-06-25 LAB — CREATININE, SERUM
CREATININE: 0.6 mg/dL — AB (ref 0.61–1.24)
GFR calc Af Amer: >60 mL/min (ref >60)
GFR calc non Af Amer: >60 mL/min (ref >60)

## 2017-06-25 MED ORDER — LIDOCAINE 4 % EX CREA
TOPICAL_CREAM | Freq: Two times a day (BID) | CUTANEOUS | Status: DC
  Filled 2017-06-25: qty 5

## 2017-06-25 MED ORDER — LIDOCAINE 5 % EX OINT: TOPICAL_OINTMENT | Freq: Two times a day (BID) | CUTANEOUS | 0 refills | Status: AC

## 2017-06-25 MED ORDER — LIDOCAINE 5 % EX OINT
TOPICAL_OINTMENT | Freq: Two times a day (BID) | CUTANEOUS | Status: DC
  Filled 2017-06-25: qty 35.44

## 2017-06-25 NOTE — Progress Notes (Signed)
Per [facility] rep, bed available today and rep to call daughter to confirm that that was still the plan per family. Room number and phone number for report given to RN. Staff RN to arrange transport. Marney Doctor RN,BSN,NCM [phone]

## 2017-06-25 NOTE — Progress Notes (Signed)
report called to [facility].  Patient to be transported via [facility].

## 2017-06-26 LAB — CBC WITH DIFFERENTIAL/PLATELET
BASOS PCT: 0 %
Band Neutrophils: 4 %
Basophils Absolute: 0 10*3/uL (ref 0.0–0.1)
Blasts: 0 %
EOS PCT: 0 %
Eosinophils Absolute: 0 10*3/uL (ref 0.0–0.7)
HCT: 34.7 % — ABNORMAL LOW (ref 39.0–52.0)
HEMOGLOBIN: 11.5 g/dL — AB (ref 13.0–17.0)
LYMPHS ABS: 0.4 10*3/uL — AB (ref 0.7–4.0)
LYMPHS PCT: 2 %
MCH: 23.6 pg — ABNORMAL LOW (ref 26.0–34.0)
MCHC: 33.1 g/dL (ref 30.0–36.0)
MCV: 71.3 fL — AB (ref 78.0–100.0)
MONO ABS: 0.8 10*3/uL (ref 0.1–1.0)
Metamyelocytes Relative: 0 %
Monocytes Relative: 4 %
Myelocytes: 0 %
NEUTROS PCT: 90 %
NRBC: 0 /100{WBCs}
Neutro Abs: 20 10*3/uL — ABNORMAL HIGH (ref 1.7–7.7)
OTHER: 0 %
PROMYELOCYTES ABS: 0 %
Platelets: 290 10*3/uL (ref 150–400)
RBC: 4.87 MIL/uL (ref 4.22–5.81)
RDW: 18.5 % — ABNORMAL HIGH (ref 11.5–15.5)
WBC: 21.2 10*3/uL — AB (ref 4.0–10.5)

## 2017-06-26 LAB — COMPREHENSIVE METABOLIC PANEL
ALT: 56 U/L (ref 17–63)
ANION GAP: 11 (ref 5–15)
AST: 37 U/L (ref 15–41)
Albumin: 2.8 g/dL — ABNORMAL LOW (ref 3.5–5.0)
Alkaline Phosphatase: 239 U/L — ABNORMAL HIGH (ref 38–126)
BUN: 30 mg/dL — ABNORMAL HIGH (ref 6–20)
CHLORIDE: 105 mmol/L (ref 101–111)
CO2: 24 mmol/L (ref 22–32)
Calcium: 9.2 mg/dL (ref 8.9–10.3)
Creatinine, Ser: 1.5 mg/dL — ABNORMAL HIGH (ref 0.61–1.24)
GFR calc non Af Amer: 39 mL/min — ABNORMAL LOW (ref >60)
GFR, EST AFRICAN AMERICAN: 45 mL/min — AB (ref >60)
Glucose, Bld: 144 mg/dL — ABNORMAL HIGH (ref 65–99)
Potassium: 5.1 mmol/L (ref 3.5–5.1)
SODIUM: 140 mmol/L (ref 135–145)
Total Bilirubin: 0.7 mg/dL (ref 0.3–1.2)
Total Protein: 6.9 g/dL (ref 6.5–8.1)

## 2017-06-26 LAB — BLOOD GAS, ARTERIAL
ACID-BASE EXCESS: 0.4 mmol/L (ref 0.0–2.0)
Bicarbonate: 23.7 mmol/L (ref 20.0–28.0)
FIO2: 100
O2 Saturation: 99.6 %
PCO2 ART: 33 mmHg (ref 32.0–48.0)
PH ART: 7.469 — AB (ref 7.350–7.450)
Patient temperature: 98.6
pO2, Arterial: 284 mmHg — ABNORMAL HIGH (ref 83.0–108.0)

## 2017-06-26 LAB — BASIC METABOLIC PANEL
Anion gap: 10 (ref 5–15)
BUN: 40 mg/dL — AB (ref 6–20)
CHLORIDE: 107 mmol/L (ref 101–111)
CO2: 22 mmol/L (ref 22–32)
Calcium: 9 mg/dL (ref 8.9–10.3)
Creatinine, Ser: 1.84 mg/dL — ABNORMAL HIGH (ref 0.61–1.24)
GFR, EST AFRICAN AMERICAN: 35 mL/min — AB (ref >60)
GFR, EST NON AFRICAN AMERICAN: 30 mL/min — AB (ref >60)
Glucose, Bld: 148 mg/dL — ABNORMAL HIGH (ref 65–99)
POTASSIUM: 5.7 mmol/L — AB (ref 3.5–5.1)
SODIUM: 139 mmol/L (ref 135–145)

## 2017-06-26 LAB — HEMOGLOBIN A1C
HEMOGLOBIN A1C: 6.2 % — AB (ref 4.8–5.6)
Mean Plasma Glucose: 131.24 mg/dL

## 2017-06-26 LAB — LACTIC ACID, PLASMA
LACTIC ACID, VENOUS: 2.7 mmol/L — AB (ref 0.5–1.9)
Lactic Acid, Venous: 3.5 mmol/L (ref 0.5–1.9)

## 2017-06-27 ENCOUNTER — Other Ambulatory Visit (HOSPITAL_COMMUNITY): Payer: Self-pay

## 2017-06-27 DIAGNOSIS — Z4682 Encounter for fitting and adjustment of non-vascular catheter: Secondary | ICD-10-CM | POA: Diagnosis not present

## 2017-06-27 LAB — URINALYSIS, ROUTINE W REFLEX MICROSCOPIC
BILIRUBIN URINE: NEGATIVE
Glucose, UA: 50 mg/dL — AB
Ketones, ur: NEGATIVE mg/dL
LEUKOCYTES UA: NEGATIVE
Nitrite: NEGATIVE
PROTEIN: NEGATIVE mg/dL
SPECIFIC GRAVITY, URINE: 1.015 (ref 1.005–1.030)
Squamous Epithelial / LPF: NONE SEEN
pH: 5 (ref 5.0–8.0)

## 2017-06-27 LAB — CBC WITH DIFFERENTIAL/PLATELET
BASOS ABS: 0 10*3/uL (ref 0.0–0.1)
Basophils Relative: 0 %
EOS ABS: 0.1 10*3/uL (ref 0.0–0.7)
EOS PCT: 1 %
HCT: 29.8 % — ABNORMAL LOW (ref 39.0–52.0)
Hemoglobin: 9.6 g/dL — ABNORMAL LOW (ref 13.0–17.0)
Lymphocytes Relative: 11 %
Lymphs Abs: 1.6 10*3/uL (ref 0.7–4.0)
MCH: 23.2 pg — AB (ref 26.0–34.0)
MCHC: 32.2 g/dL (ref 30.0–36.0)
MCV: 72 fL — AB (ref 78.0–100.0)
MONOS PCT: 6 %
Monocytes Absolute: 0.9 10*3/uL (ref 0.1–1.0)
NEUTROS PCT: 82 %
Neutro Abs: 12 10*3/uL — ABNORMAL HIGH (ref 1.7–7.7)
PLATELETS: 253 10*3/uL (ref 150–400)
RBC: 4.14 MIL/uL — ABNORMAL LOW (ref 4.22–5.81)
RDW: 18.5 % — ABNORMAL HIGH (ref 11.5–15.5)
WBC Morphology: INCREASED
WBC: 14.6 10*3/uL — AB (ref 4.0–10.5)

## 2017-06-27 LAB — BASIC METABOLIC PANEL
Anion gap: 10 (ref 5–15)
BUN: 42 mg/dL — AB (ref 6–20)
CO2: 22 mmol/L (ref 22–32)
CREATININE: 1.26 mg/dL — AB (ref 0.61–1.24)
Calcium: 8.8 mg/dL — ABNORMAL LOW (ref 8.9–10.3)
Chloride: 110 mmol/L (ref 101–111)
GFR calc Af Amer: 55 mL/min — ABNORMAL LOW (ref >60)
GFR, EST NON AFRICAN AMERICAN: 48 mL/min — AB (ref >60)
GLUCOSE: 117 mg/dL — AB (ref 65–99)
Potassium: 4.9 mmol/L (ref 3.5–5.1)
SODIUM: 142 mmol/L (ref 135–145)

## 2017-06-27 LAB — LACTIC ACID, PLASMA: LACTIC ACID, VENOUS: 1.8 mmol/L (ref 0.5–1.9)

## 2017-06-27 MED ORDER — IOPAMIDOL (ISOVUE-300) INJECTION 61%
INTRAVENOUS | Status: AC
  Administered 2017-06-27: 15 mL
  Filled 2017-06-27: qty 50

## 2017-06-27 MED ORDER — IOPAMIDOL (ISOVUE-300) INJECTION 61%
50.0000 mL | Freq: Once | INTRAVENOUS | Status: AC | PRN
  Administered 2017-06-27: 15 mL

## 2017-06-27 MED ORDER — LIDOCAINE VISCOUS 2 % MT SOLN
15.0000 mL | Freq: Once | OROMUCOSAL | Status: AC
  Administered 2017-06-27: 2 mL via OROMUCOSAL

## 2017-06-27 MED ORDER — LIDOCAINE VISCOUS 2 % MT SOLN
OROMUCOSAL | Status: AC
  Administered 2017-06-27: 2 mL via OROMUCOSAL
  Filled 2017-06-27: qty 15

## 2017-06-28 ENCOUNTER — Other Ambulatory Visit (HOSPITAL_COMMUNITY): Payer: Self-pay

## 2017-06-28 DIAGNOSIS — Z4682 Encounter for fitting and adjustment of non-vascular catheter: Secondary | ICD-10-CM | POA: Diagnosis not present

## 2017-06-28 LAB — URINE CULTURE: Culture: NO GROWTH

## 2017-06-29 LAB — BASIC METABOLIC PANEL
Anion gap: 8 (ref 5–15)
BUN: 26 mg/dL — ABNORMAL HIGH (ref 6–20)
CALCIUM: 8.2 mg/dL — AB (ref 8.9–10.3)
CO2: 26 mmol/L (ref 22–32)
CREATININE: 0.66 mg/dL (ref 0.61–1.24)
Chloride: 109 mmol/L (ref 101–111)
GFR calc non Af Amer: >60 mL/min (ref >60)
Glucose, Bld: 102 mg/dL — ABNORMAL HIGH (ref 65–99)
Potassium: 3.9 mmol/L (ref 3.5–5.1)
SODIUM: 143 mmol/L (ref 135–145)

## 2017-06-29 LAB — CBC
HCT: 25.8 % — ABNORMAL LOW (ref 39.0–52.0)
Hemoglobin: 8.4 g/dL — ABNORMAL LOW (ref 13.0–17.0)
MCH: 23.1 pg — AB (ref 26.0–34.0)
MCHC: 32.6 g/dL (ref 30.0–36.0)
MCV: 70.9 fL — ABNORMAL LOW (ref 78.0–100.0)
Platelets: 250 10*3/uL (ref 150–400)
RBC: 3.64 MIL/uL — AB (ref 4.22–5.81)
RDW: 17.9 % — ABNORMAL HIGH (ref 11.5–15.5)
WBC: 9.4 10*3/uL (ref 4.0–10.5)

## 2017-06-29 LAB — MAGNESIUM: MAGNESIUM: 1.9 mg/dL (ref 1.7–2.4)

## 2017-06-29 LAB — PHOSPHORUS: Phosphorus: 2.5 mg/dL (ref 2.5–4.6)

## 2017-06-30 LAB — CULTURE, RESPIRATORY W GRAM STAIN

## 2017-06-30 LAB — CULTURE, RESPIRATORY
CULTURE: NORMAL
GRAM STAIN: NONE SEEN

## 2017-07-01 ENCOUNTER — Other Ambulatory Visit (HOSPITAL_COMMUNITY): Payer: Self-pay

## 2017-07-01 DIAGNOSIS — K56609 Unspecified intestinal obstruction, unspecified as to partial versus complete obstruction: Secondary | ICD-10-CM | POA: Diagnosis not present

## 2017-07-02 ENCOUNTER — Other Ambulatory Visit (HOSPITAL_COMMUNITY): Payer: Self-pay

## 2017-07-02 DIAGNOSIS — Z4682 Encounter for fitting and adjustment of non-vascular catheter: Secondary | ICD-10-CM | POA: Diagnosis not present

## 2017-07-02 DIAGNOSIS — J9691 Respiratory failure, unspecified with hypoxia: Secondary | ICD-10-CM | POA: Diagnosis not present

## 2017-07-02 LAB — VANCOMYCIN, TROUGH

## 2017-07-02 MED ORDER — IOPAMIDOL (ISOVUE-300) INJECTION 61%
INTRAVENOUS | Status: AC
  Administered 2017-07-02: 15 mL
  Filled 2017-07-02: qty 50

## 2017-07-02 MED ORDER — LIDOCAINE VISCOUS 2 % MT SOLN
OROMUCOSAL | Status: AC
  Administered 2017-07-02: 6 mL via OROMUCOSAL
  Filled 2017-07-02: qty 15

## 2017-07-02 MED ORDER — IOPAMIDOL (ISOVUE-300) INJECTION 61%
50.0000 mL | Freq: Once | INTRAVENOUS | Status: AC | PRN
  Administered 2017-07-02: 15 mL

## 2017-07-02 MED ORDER — LIDOCAINE VISCOUS 2 % MT SOLN
15.0000 mL | Freq: Once | OROMUCOSAL | Status: AC
  Administered 2017-07-02: 6 mL via OROMUCOSAL

## 2017-07-04 LAB — BASIC METABOLIC PANEL
Anion gap: 7 (ref 5–15)
BUN: 17 mg/dL (ref 6–20)
CALCIUM: 8.6 mg/dL — AB (ref 8.9–10.3)
CO2: 28 mmol/L (ref 22–32)
Chloride: 102 mmol/L (ref 101–111)
Creatinine, Ser: 0.73 mg/dL (ref 0.61–1.24)
GFR calc Af Amer: >60 mL/min (ref >60)
GFR calc non Af Amer: >60 mL/min (ref >60)
Glucose, Bld: 122 mg/dL — ABNORMAL HIGH (ref 65–99)
POTASSIUM: 4.7 mmol/L (ref 3.5–5.1)
SODIUM: 137 mmol/L (ref 135–145)

## 2017-07-04 LAB — CBC
HCT: 30.1 % — ABNORMAL LOW (ref 39.0–52.0)
Hemoglobin: 9.6 g/dL — ABNORMAL LOW (ref 13.0–17.0)
MCH: 22.9 pg — AB (ref 26.0–34.0)
MCHC: 31.9 g/dL (ref 30.0–36.0)
MCV: 71.8 fL — ABNORMAL LOW (ref 78.0–100.0)
PLATELETS: 374 10*3/uL (ref 150–400)
RBC: 4.19 MIL/uL — AB (ref 4.22–5.81)
RDW: 19.2 % — ABNORMAL HIGH (ref 11.5–15.5)
WBC: 12 10*3/uL — AB (ref 4.0–10.5)

## 2017-07-09 ENCOUNTER — Other Ambulatory Visit (HOSPITAL_COMMUNITY): Payer: Self-pay

## 2017-07-09 DIAGNOSIS — J9600 Acute respiratory failure, unspecified whether with hypoxia or hypercapnia: Secondary | ICD-10-CM | POA: Diagnosis not present

## 2017-07-09 LAB — CBC
HEMATOCRIT: 32.1 % — AB (ref 39.0–52.0)
Hemoglobin: 10 g/dL — ABNORMAL LOW (ref 13.0–17.0)
MCH: 23.3 pg — ABNORMAL LOW (ref 26.0–34.0)
MCHC: 31.2 g/dL (ref 30.0–36.0)
MCV: 74.7 fL — AB (ref 78.0–100.0)
Platelets: 398 10*3/uL (ref 150–400)
RBC: 4.3 MIL/uL (ref 4.22–5.81)
RDW: 21.9 % — AB (ref 11.5–15.5)
WBC: 17.5 10*3/uL — AB (ref 4.0–10.5)

## 2017-07-09 LAB — BASIC METABOLIC PANEL
ANION GAP: 11 (ref 5–15)
BUN: 18 mg/dL (ref 6–20)
CALCIUM: 9 mg/dL (ref 8.9–10.3)
CO2: 27 mmol/L (ref 22–32)
Chloride: 98 mmol/L — ABNORMAL LOW (ref 101–111)
Creatinine, Ser: 0.69 mg/dL (ref 0.61–1.24)
GFR calc Af Amer: >60 mL/min (ref >60)
GFR calc non Af Amer: >60 mL/min (ref >60)
GLUCOSE: 121 mg/dL — AB (ref 65–99)
POTASSIUM: 4.6 mmol/L (ref 3.5–5.1)
Sodium: 136 mmol/L (ref 135–145)

## 2017-07-11 NOTE — Progress Notes (Addendum)
[facility] Liaison: RN visit  Notified by Douglas Mckee [facility] of patient/family request for [facility] services at home after discharge. Chart and patient information under review by [facility] physician. Hospice eligibility approved by Dr. Konrad Dolores.  Writer spoke with daughter, Douglas Mckee at bedside to initiate education related to hospice philosophy, services and team approach to care. Lorie verbalized understanding of information given. Per discussion, plan is for discharge to home by [facility] 07/13/2017 or 07/14/2017.  Please send signed and completed DNR form home with patient/family. Patient will need prescriptions for discharge comfort medications.  DME needs have been discussed, patient currently has the following equipment in the home: hospital bed, W/C, walker, shower bench and a lift chair. Patient/family requests the following DME for delivery to the home: [facility]. [facility] equipment manager has been notified and will contact [facility] to arrange delivery to the home. Home address has been verified and is correct in the chart. Douglas Mckee is the family member to contact to arrange time of delivery.  [facility] aware of the above. Please notify [facility] when patient is ready to leave the unit at discharge. (Call [phone] or [phone] after 5pm.) [facility] information and contact numbers given to at time of visit. Above information shared with [facility].  Please call with any hospice related questions.  Thank you for this referral.  Farrel Gordon, RN, [facility] Liaison [phone] ? [facility] liaisons are now  [place].

## 2017-07-12 LAB — URINALYSIS, ROUTINE W REFLEX MICROSCOPIC
Bilirubin Urine: NEGATIVE
Glucose, UA: NEGATIVE mg/dL
KETONES UR: NEGATIVE mg/dL
LEUKOCYTES UA: NEGATIVE
Nitrite: NEGATIVE
PROTEIN: NEGATIVE mg/dL
SQUAMOUS EPITHELIAL / LPF: NONE SEEN
Specific Gravity, Urine: 1.014 (ref 1.005–1.030)
pH: 9 — ABNORMAL HIGH (ref 5.0–8.0)

## 2017-07-12 LAB — BASIC METABOLIC PANEL
ANION GAP: 11 (ref 5–15)
BUN: 18 mg/dL (ref 6–20)
CALCIUM: 9.4 mg/dL (ref 8.9–10.3)
CHLORIDE: 97 mmol/L — AB (ref 101–111)
CO2: 27 mmol/L (ref 22–32)
Creatinine, Ser: 0.66 mg/dL (ref 0.61–1.24)
GFR calc non Af Amer: >60 mL/min (ref >60)
Glucose, Bld: 104 mg/dL — ABNORMAL HIGH (ref 65–99)
Potassium: 4.8 mmol/L (ref 3.5–5.1)
SODIUM: 135 mmol/L (ref 135–145)

## 2017-07-12 LAB — CBC
HEMATOCRIT: 39 % (ref 39.0–52.0)
HEMOGLOBIN: 12.6 g/dL — AB (ref 13.0–17.0)
MCH: 24.2 pg — ABNORMAL LOW (ref 26.0–34.0)
MCHC: 32.3 g/dL (ref 30.0–36.0)
MCV: 74.9 fL — ABNORMAL LOW (ref 78.0–100.0)
Platelets: 365 10*3/uL (ref 150–400)
RBC: 5.21 MIL/uL (ref 4.22–5.81)
RDW: 21.6 % — AB (ref 11.5–15.5)
WBC: 13.6 10*3/uL — AB (ref 4.0–10.5)

## 2017-07-17 DIAGNOSIS — Z0000 Encounter for general adult medical examination without abnormal findings: Secondary | ICD-10-CM | POA: Diagnosis not present

## 2017-07-17 DIAGNOSIS — I10 Essential (primary) hypertension: Secondary | ICD-10-CM | POA: Diagnosis not present

## 2017-07-17 DIAGNOSIS — M6281 Muscle weakness (generalized): Secondary | ICD-10-CM | POA: Diagnosis not present

## 2017-07-17 DIAGNOSIS — G309 Alzheimer's disease, unspecified: Secondary | ICD-10-CM | POA: Diagnosis not present

## 2017-07-17 DIAGNOSIS — R627 Adult failure to thrive: Secondary | ICD-10-CM | POA: Diagnosis not present

## 2017-07-27 DEATH — deceased

## 2019-05-11 IMAGING — DX DG CHEST 1V PORT
1 series · 1 of 1 positions shown · non-contrast
Comparison: 06/04/2017 chest x-ray.

CLINICAL DATA: [AGE] male with acute respiratory failure and
hypoxia. Alzheimer's. Subsequent encounter.

EXAM:
PORTABLE CHEST 1 VIEW

[chest ap]
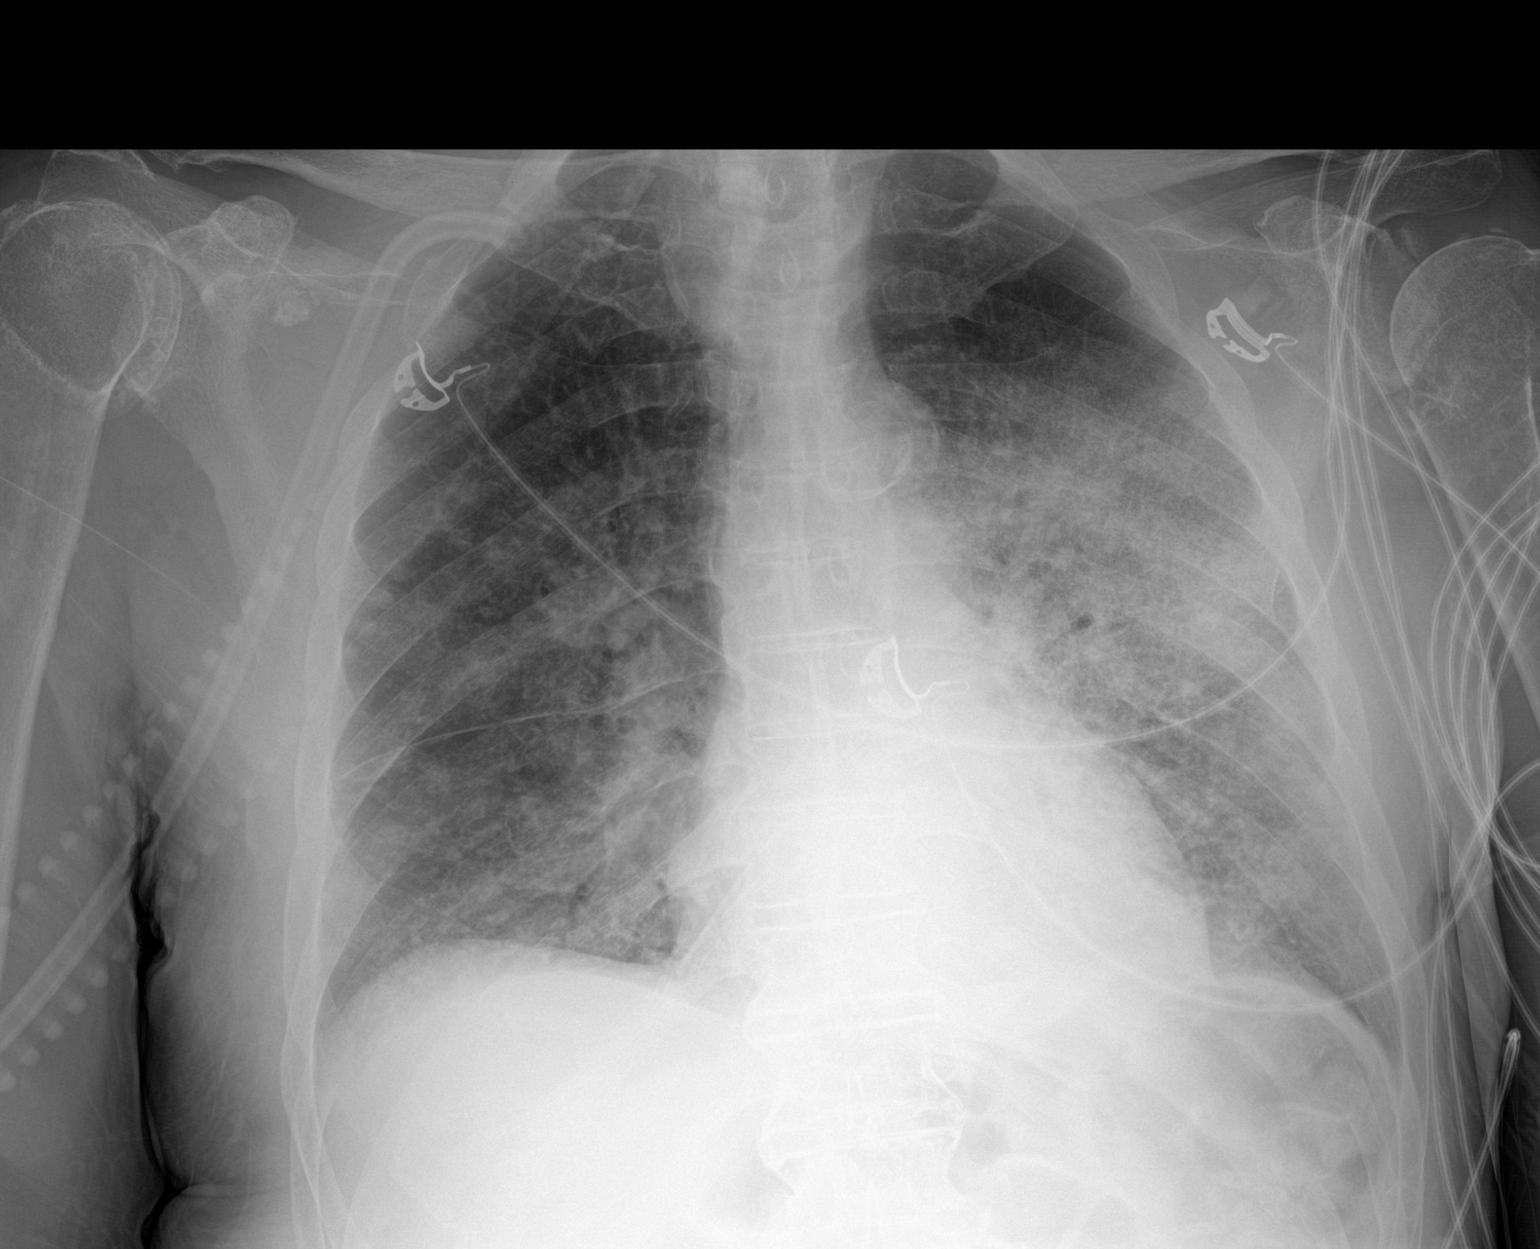

[1 of 1 positions shown; findings below may reference images not displayed]

FINDINGS: Asymmetric airspace disease now greatest in the left perihilar
region.

No pneumothorax.

Heart size within normal limits.

Calcified aorta.

No acute osseous abduct
IMPRESSION: Asymmetric airspace disease now greatest in the left perihilar
region. This may represent asymmetric pulmonary edema although
infectious process also possible in the proper clinical setting.

Aortic Atherosclerosis ([id number]).

## 2019-05-16 IMAGING — DX DG ABD PORTABLE 1V
1 series · 1 of 1 positions shown · non-contrast
Comparison: None.

CLINICAL DATA: Feeding tube placement.

EXAM:
ABDOMEN - 1 VIEW

[abdomen kub]
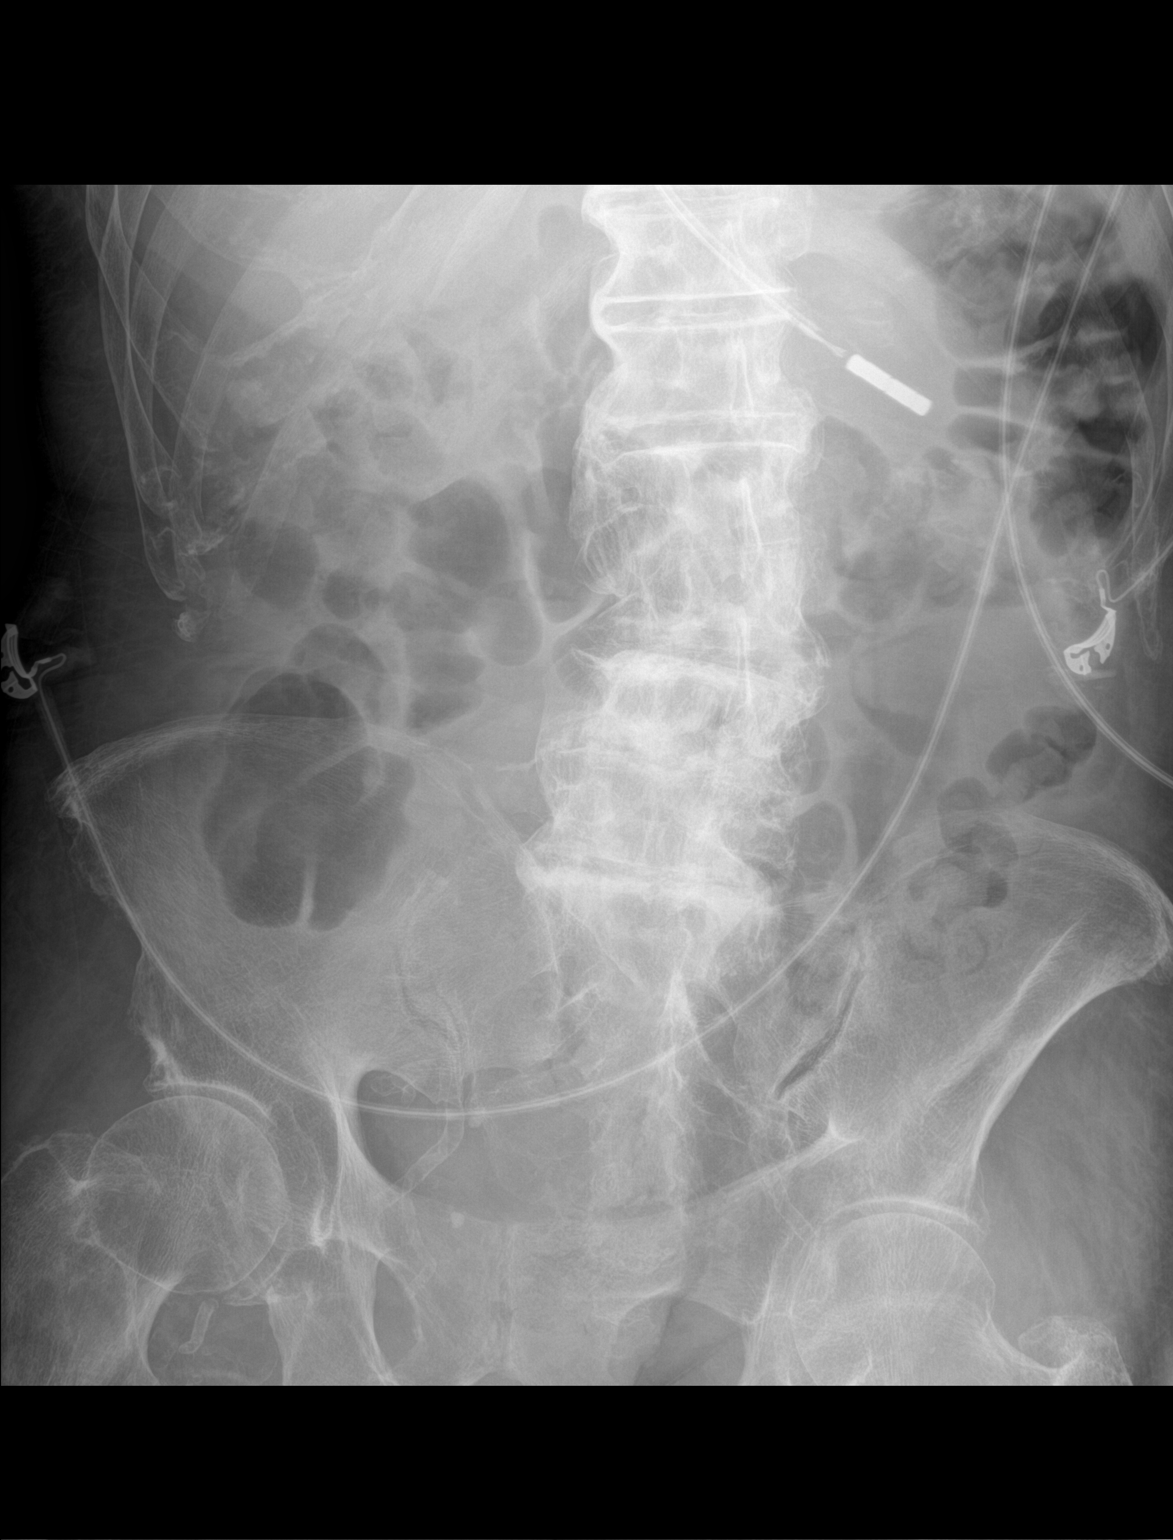

[1 of 1 positions shown; findings below may reference images not displayed]

FINDINGS: The feeding tube tip is in the body region of the stomach. The bowel
gas pattern is unremarkable. The bony structures are intact.
IMPRESSION: Feeding tube tip in the body region of the stomach.

## 2019-05-16 IMAGING — DX DG CHEST 1V PORT
1 series · 1 of 1 positions shown · non-contrast
Comparison: 06/05/2017

CLINICAL DATA: Feeding tube placement.

EXAM:
CHEST 1 VIEW

[chest ap]
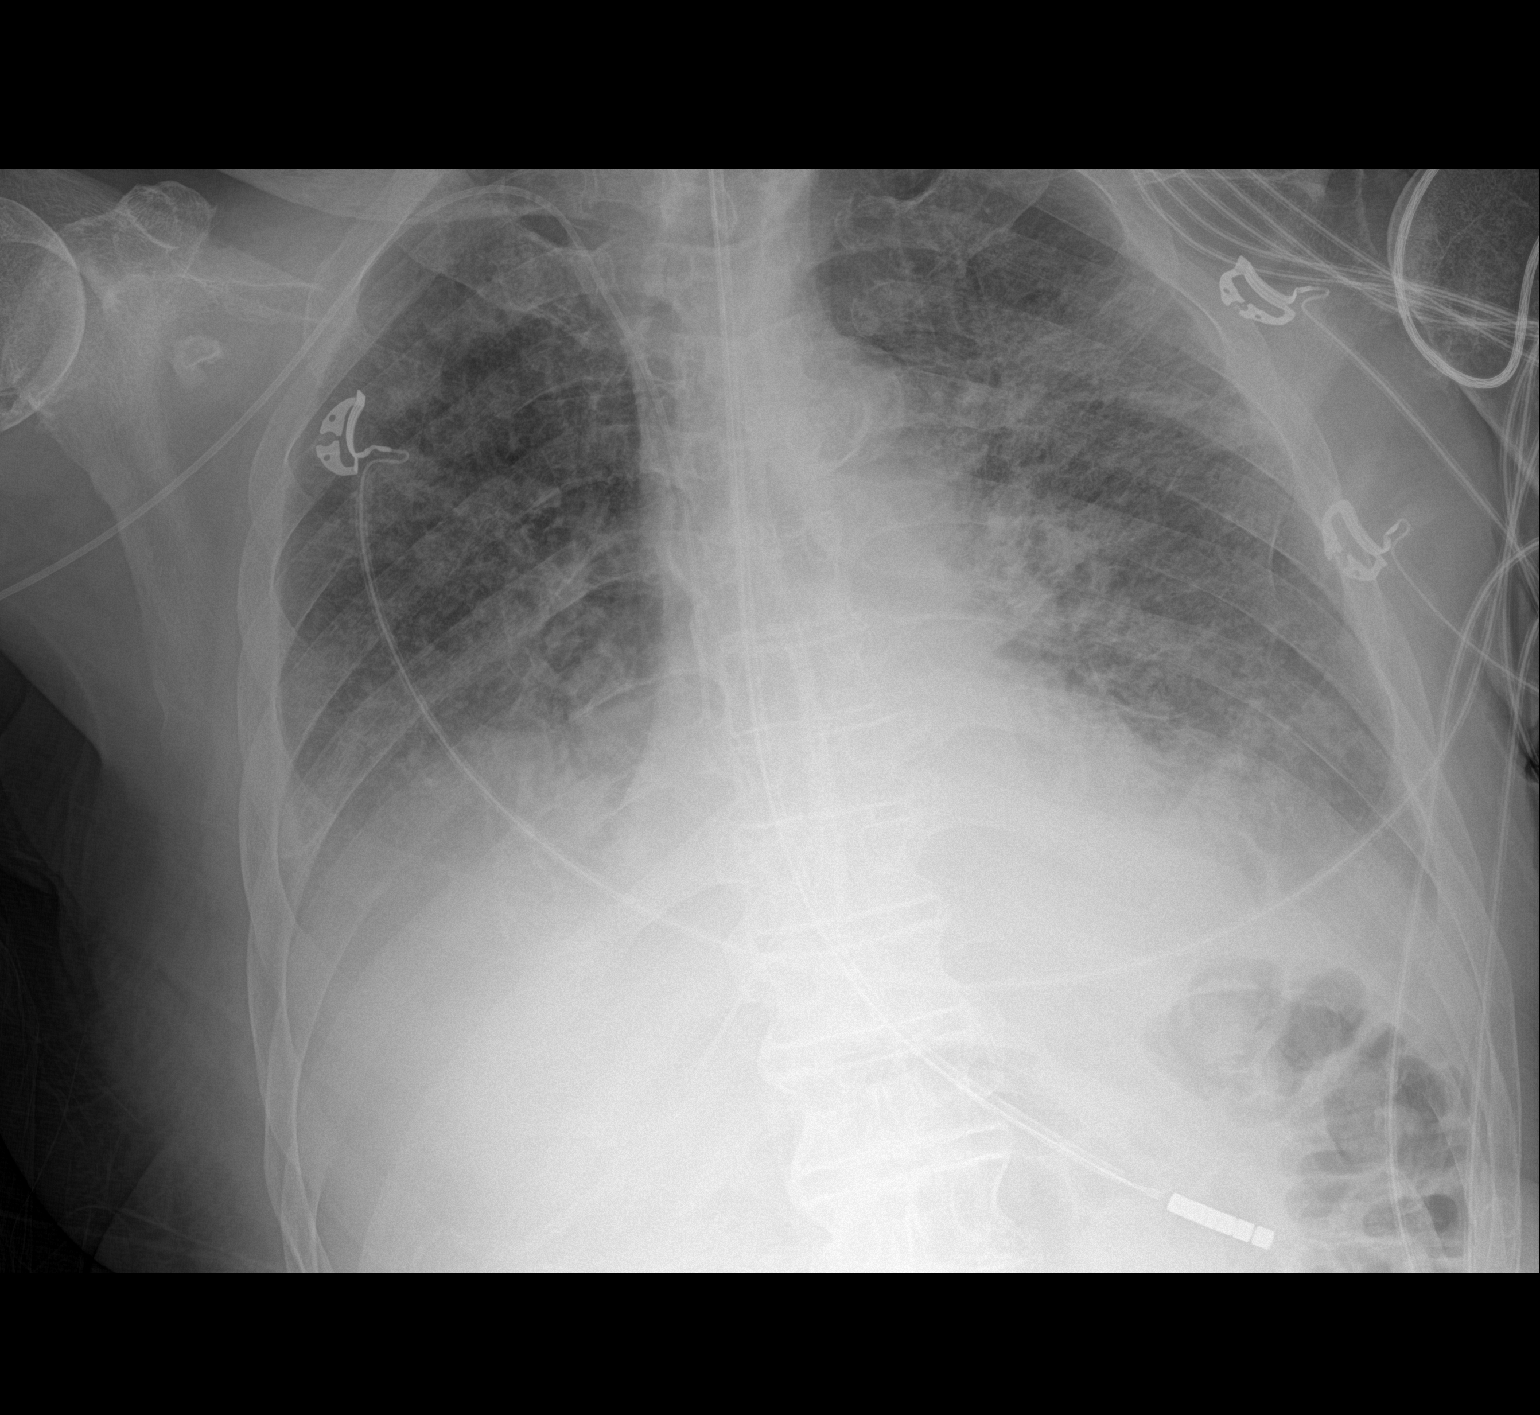

[1 of 1 positions shown; findings below may reference images not displayed]

FINDINGS: The feeding tube tip is in the body region of the stomach. Right
PICC line tip is in the distal SVC. Persistent diffuse interstitial
process and pleural effusions which could represent edema or
infiltrates. The left upper lobe consolidation has significantly
improved.
IMPRESSION: The feeding tube tip is in the body region of the stomach.

Right PICC line tip is in the distal SVC.

Improving/resolving left upper lobe pneumonia. Persistent diffuse
interstitial process, effusions and basilar atelectasis.

## 2019-05-18 IMAGING — DX DG CHEST 1V PORT
1 series · 1 of 1 positions shown · non-contrast
Comparison: Radiograph June 10, 2017.

CLINICAL DATA: Dyspnea.

EXAM:
PORTABLE CHEST 1 VIEW

[chest ap]
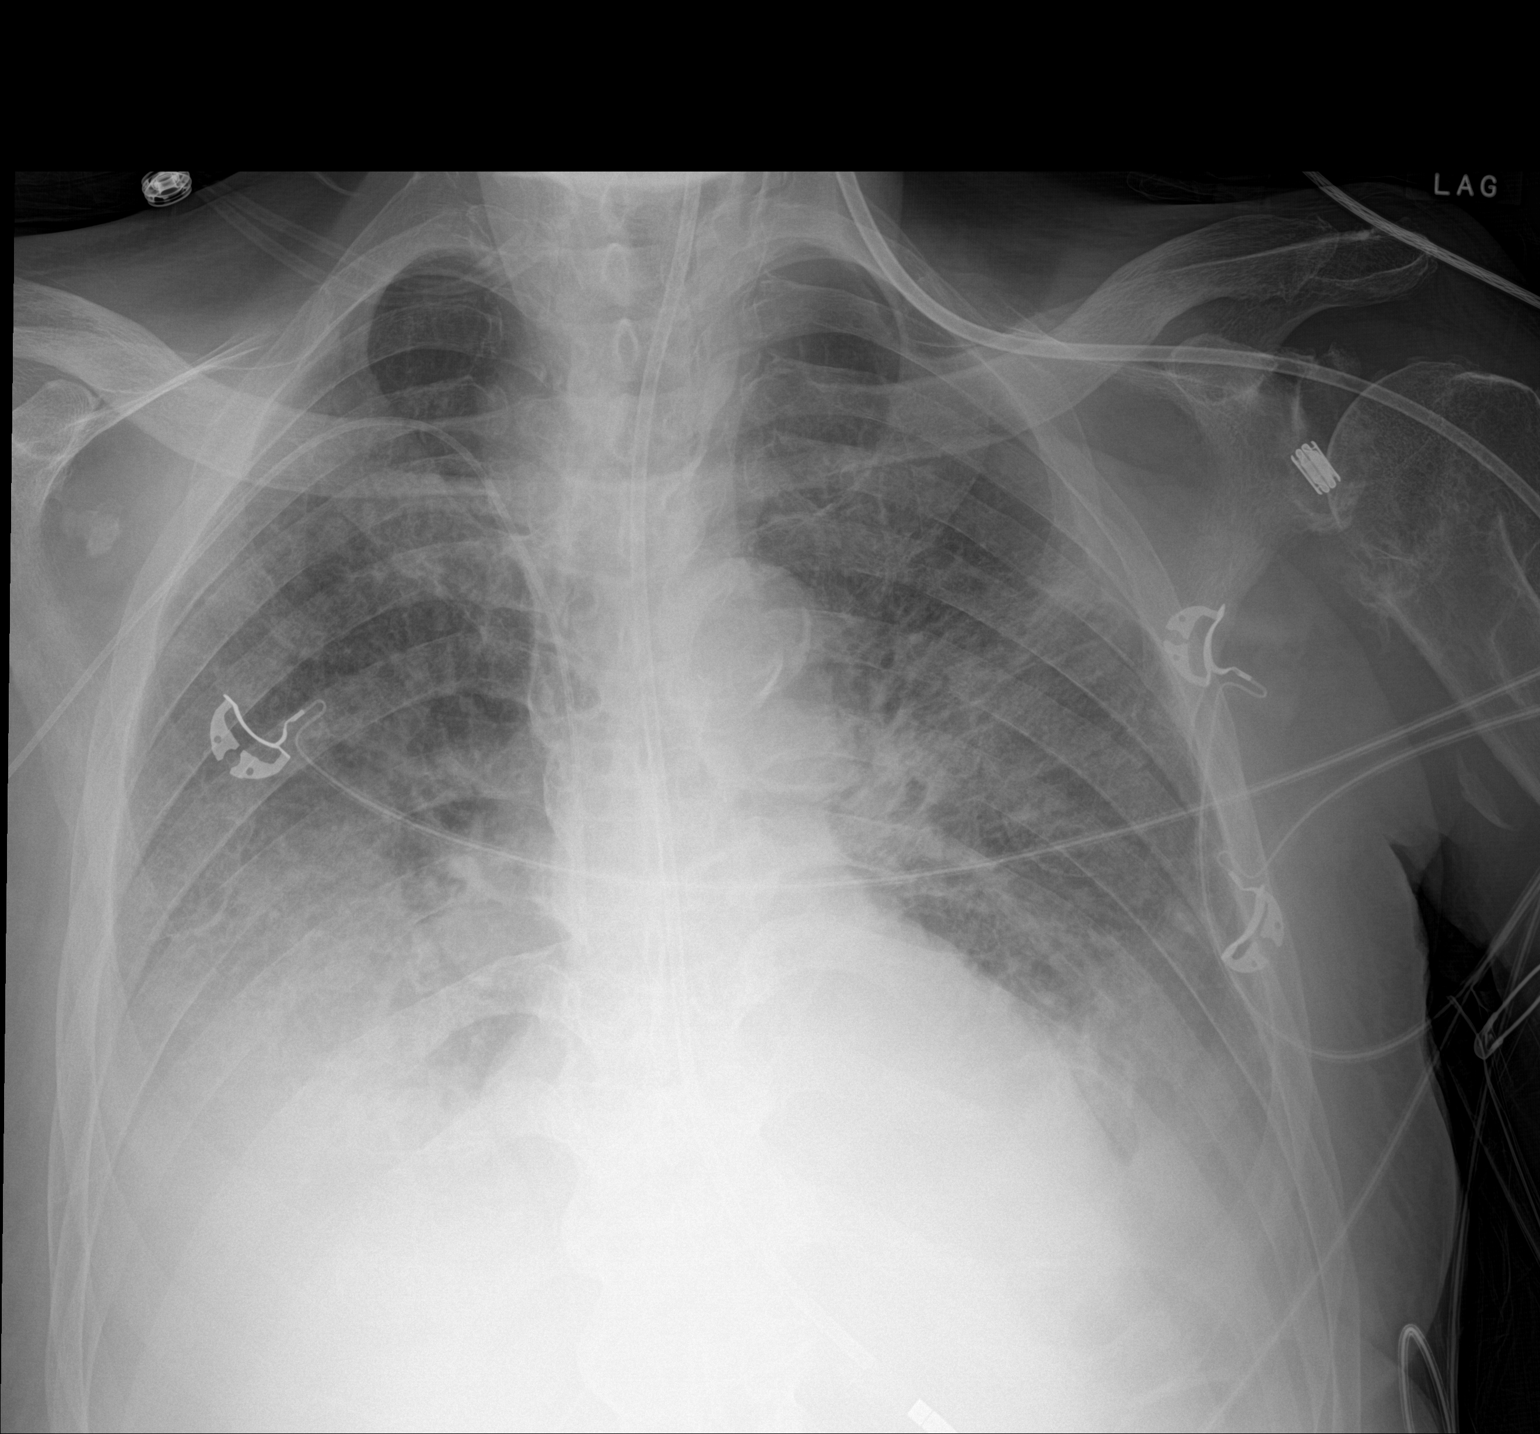

[1 of 1 positions shown; findings below may reference images not displayed]

FINDINGS: Stable cardiomediastinal silhouette. Atherosclerosis of thoracic
aorta is noted. Feeding tube is unchanged in position. Right-sided
PICC line is unchanged. No pneumothorax is noted. Stable bilateral
lung opacities are noted concerning for edema or pneumonia, with
stable bibasilar atelectasis and effusions. Bony thorax is
unremarkable.
IMPRESSION: Aortic atherosclerosis. Stable support apparatus. Stable bilateral
lung opacities concerning for edema or pneumonia with probable
bibasilar atelectasis and effusions.

## 2019-05-19 IMAGING — RF DG SWALLOWING FUNCTION - [facility]
18 series · 24 of 24 positions shown · non-contrast
Comparison: none

[Series 1: cp_standard · 0.34mm/px · 2 of 235 frames shown (1 of 18)]
[frame 10/235]
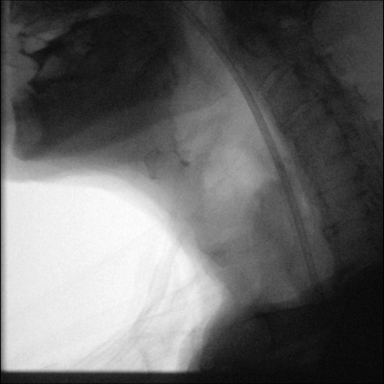
[frame 200/235]
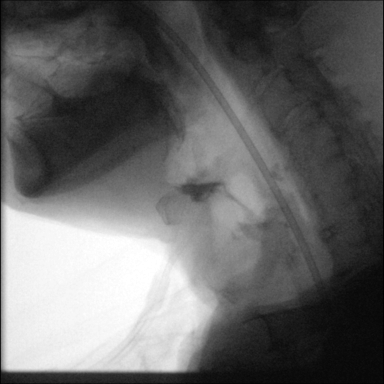

[Series 2: cp_standard · 0.34mm/px · 1 of 32 frames shown (2 of 18)]
[frame 26/32]
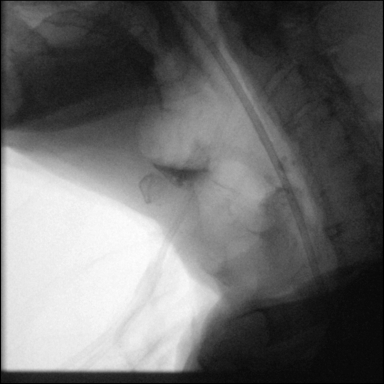

[Series 3: cp_standard · 0.34mm/px · 1 of 64 frames shown (3 of 18)]
[frame 33/64]
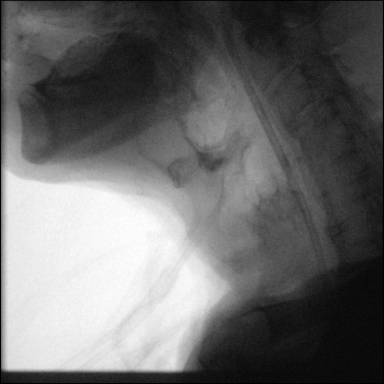

[Series 4: cp_standard · 0.34mm/px · 2 of 69 frames shown (4 of 18)]
[frame 11/69]
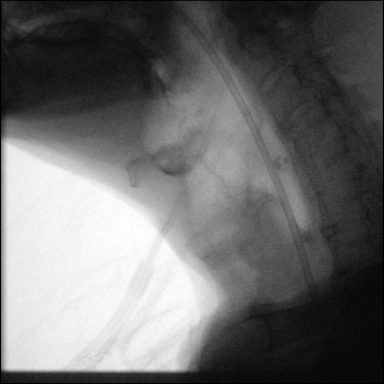
[frame 59/69]
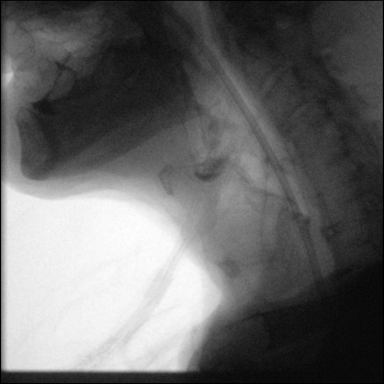

[Series 5: cp_standard · 0.34mm/px · 1 of 59 frames shown (5 of 18)]
[frame 51/59]
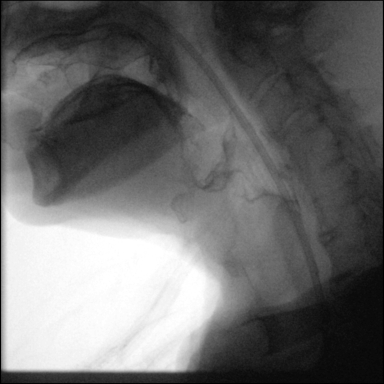

[Series 6: cp_standard · 0.34mm/px · 1 of 162 frames shown (6 of 18)]
[frame 127/162]
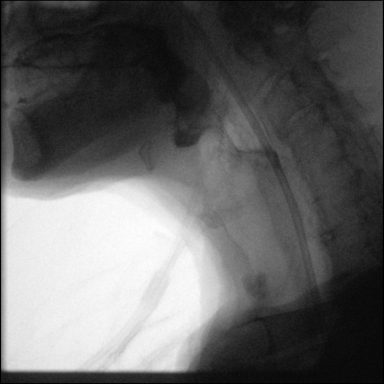

[Series 7: cp_standard · 0.34mm/px · 1 of 32 frames shown (7 of 18)]
[frame 15/32]
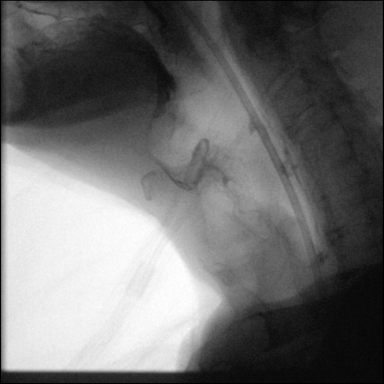

[Series 8: cp_standard · 0.34mm/px · 2 of 228 frames shown (8 of 18)]
[frame 35/228]
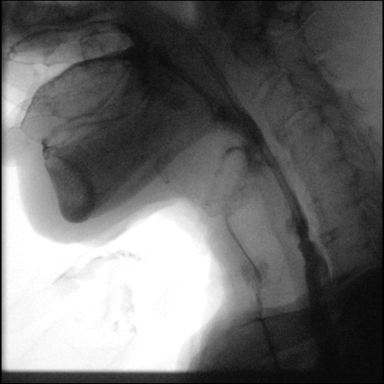
[frame 194/228]
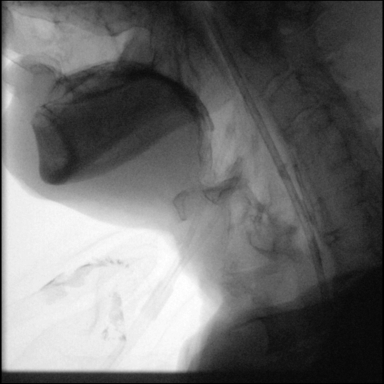

[Series 9: cp_standard · 0.34mm/px · 1 of 28 frames shown (9 of 18)]
[frame 15/28]
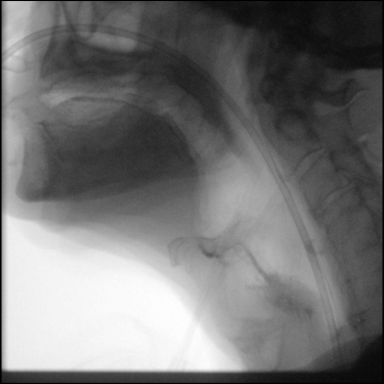

[Series 10: cp_standard · 0.34mm/px · 1 of 56 frames shown (10 of 18)]
[frame 29/56]
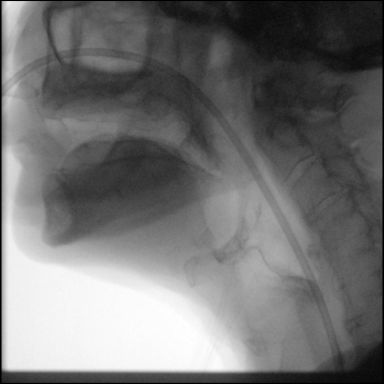

[Series 11: cp_standard · 0.34mm/px · 2 of 252 frames shown (11 of 18)]
[frame 38/252]
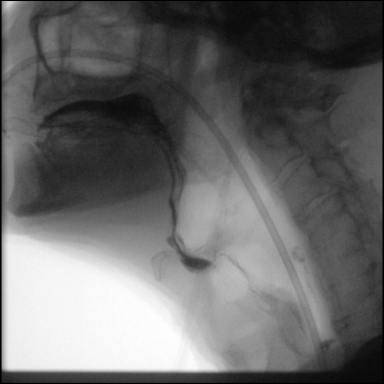
[frame 227/252]
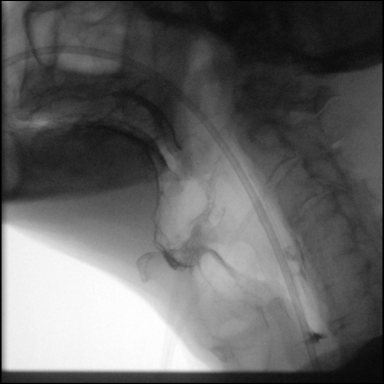

[Series 12: cp_standard · 0.34mm/px · 1 of 33 frames shown (12 of 18)]
[frame 20/33]
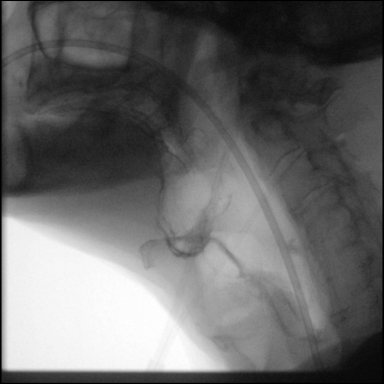

[Series 13: cp_standard · 0.34mm/px · 1 of 16 frames shown (13 of 18)]
[frame 3/16]
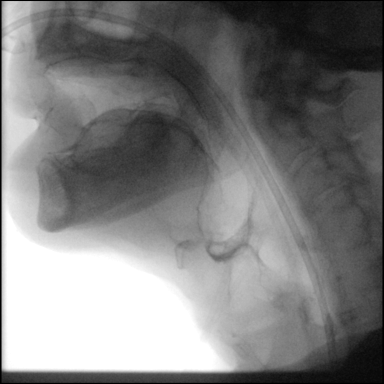

[Series 14: cp_standard · 0.34mm/px · 1 of 64 frames shown (14 of 18)]
[frame 10/64]
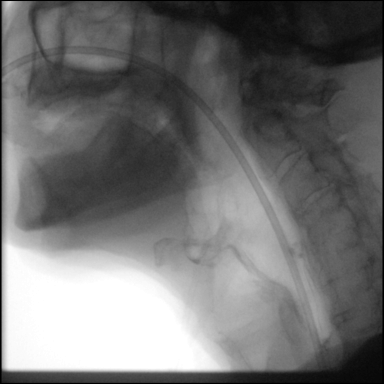

[Series 15: cp_standard · 0.34mm/px · 2 of 123 frames shown (15 of 18)]
[frame 19/123]
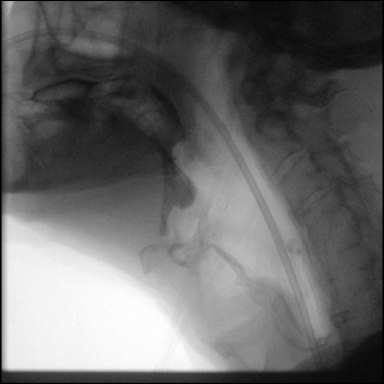
[frame 105/123]
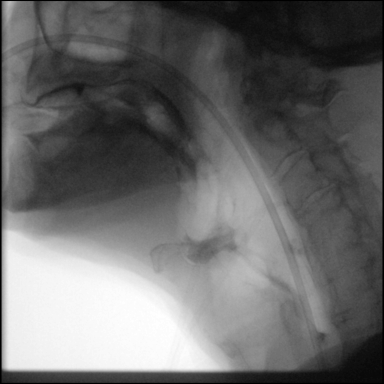

[Series 16: cp_standard · 0.34mm/px · 1 of 22 frames shown (16 of 18)]
[frame 13/22]
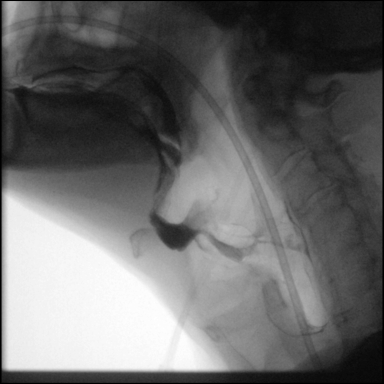

[Series 17: cp_standard · 0.34mm/px · 1 of 30 frames shown (17 of 18)]
[frame 16/30]
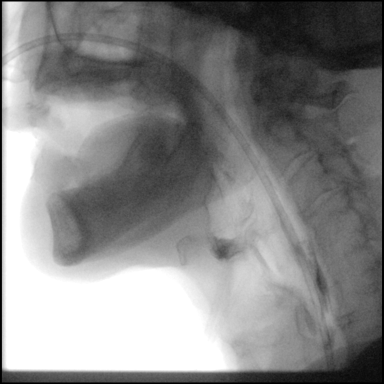

[Series 18: cp_standard · 0.34mm/px · 2 of 64 frames shown (18 of 18)]
[frame 10/64]
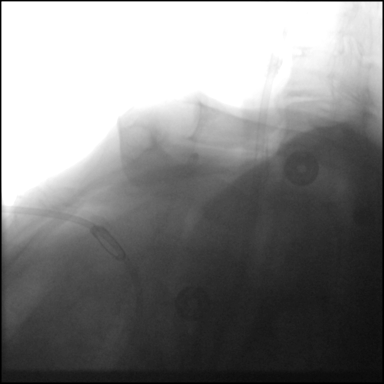
[frame 55/64]
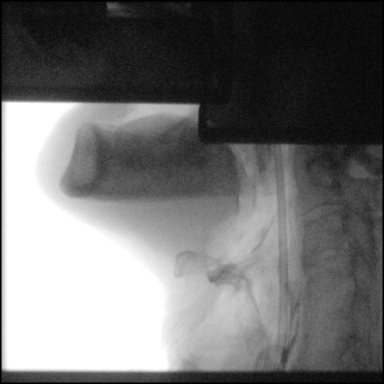

[24 of 24 positions shown; findings below may reference images not displayed]

FLUOROSCOPY FOR SWALLOWING FUNCTION STUDY:
Fluoroscopy was provided for swallowing function study, which was administered by a speech pathologist.  Final results and recommendations from this study are contained within the speech pathology report.
